# Patient Record
Sex: Female | Born: 1990 | Race: White | Hispanic: Yes | Marital: Married | Smoking: Never smoker
Health system: Southern US, Community
[De-identification: ages and names within clinical notes are randomized; demographics above are authoritative.]

## PROBLEM LIST (undated history)

## (undated) ENCOUNTER — Inpatient Hospital Stay (HOSPITAL_COMMUNITY): Payer: Self-pay

## (undated) DIAGNOSIS — B1910 Unspecified viral hepatitis B without hepatic coma: Secondary | ICD-10-CM

## (undated) DIAGNOSIS — K589 Irritable bowel syndrome without diarrhea: Secondary | ICD-10-CM

## (undated) DIAGNOSIS — N730 Acute parametritis and pelvic cellulitis: Secondary | ICD-10-CM

## (undated) DIAGNOSIS — Z789 Other specified health status: Secondary | ICD-10-CM

## (undated) DIAGNOSIS — R519 Headache, unspecified: Secondary | ICD-10-CM

## (undated) DIAGNOSIS — N9489 Other specified conditions associated with female genital organs and menstrual cycle: Secondary | ICD-10-CM

## (undated) DIAGNOSIS — T7840XA Allergy, unspecified, initial encounter: Secondary | ICD-10-CM

## (undated) DIAGNOSIS — N946 Dysmenorrhea, unspecified: Secondary | ICD-10-CM

## (undated) HISTORY — PX: NO PAST SURGERIES: SHX2092

## (undated) HISTORY — DX: Dysmenorrhea, unspecified: N94.6

## (undated) HISTORY — DX: Allergy, unspecified, initial encounter: T78.40XA

## (undated) HISTORY — DX: Irritable bowel syndrome, unspecified: K58.9

---
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
## 2009-01-07 ENCOUNTER — Encounter: Payer: Self-pay | Admitting: Family Medicine

## 2009-01-09 ENCOUNTER — Ambulatory Visit: Payer: Self-pay | Admitting: Family Medicine

## 2009-02-03 ENCOUNTER — Ambulatory Visit: Payer: Self-pay | Admitting: Family Medicine

## 2009-02-19 ENCOUNTER — Encounter: Payer: Self-pay | Admitting: Family Medicine

## 2009-02-27 ENCOUNTER — Encounter: Payer: Self-pay | Admitting: Family Medicine

## 2009-02-27 ENCOUNTER — Ambulatory Visit: Payer: Self-pay | Admitting: Family Medicine

## 2009-02-27 LAB — CONVERTED CEMR LAB: GC Probe Amp, Genital: NEGATIVE

## 2009-03-02 ENCOUNTER — Inpatient Hospital Stay (HOSPITAL_COMMUNITY): Admission: AD | Admit: 2009-03-02 | Discharge: 2009-03-04 | Payer: Self-pay | Admitting: Obstetrics & Gynecology

## 2009-03-02 ENCOUNTER — Ambulatory Visit: Payer: Self-pay | Admitting: Obstetrics and Gynecology

## 2009-04-01 ENCOUNTER — Encounter: Payer: Self-pay | Admitting: Family Medicine

## 2009-04-08 ENCOUNTER — Telehealth: Payer: Self-pay | Admitting: Family Medicine

## 2009-04-16 ENCOUNTER — Ambulatory Visit: Payer: Self-pay | Admitting: Family Medicine

## 2010-07-13 ENCOUNTER — Encounter: Payer: Self-pay | Admitting: Family Medicine

## 2010-07-22 ENCOUNTER — Encounter: Payer: Self-pay | Admitting: *Deleted

## 2010-09-25 LAB — CBC
HCT: 33.3 % — ABNORMAL LOW (ref 36.0–46.0)
MCHC: 33.7 g/dL (ref 30.0–36.0)
MCV: 88.1 fL (ref 78.0–100.0)
Platelets: 239 10*3/uL (ref 150–400)
RDW: 13.8 % (ref 11.5–15.5)

## 2010-09-30 LAB — URINALYSIS, ROUTINE W REFLEX MICROSCOPIC
Bilirubin Urine: NEGATIVE
Glucose, UA: NEGATIVE mg/dL
Hgb urine dipstick: NEGATIVE
Ketones, ur: NEGATIVE mg/dL
pH: 7 (ref 5.0–8.0)

## 2010-10-01 LAB — GC/CHLAMYDIA PROBE AMP, GENITAL
Chlamydia, DNA Probe: NEGATIVE
GC Probe Amp, Genital: NEGATIVE

## 2010-10-01 LAB — URINALYSIS, ROUTINE W REFLEX MICROSCOPIC
Bilirubin Urine: NEGATIVE
Hgb urine dipstick: NEGATIVE
Nitrite: NEGATIVE
Specific Gravity, Urine: 1.025 (ref 1.005–1.030)
pH: 5.5 (ref 5.0–8.0)

## 2010-10-01 LAB — WET PREP, GENITAL
Clue Cells Wet Prep HPF POC: NONE SEEN
Yeast Wet Prep HPF POC: NONE SEEN

## 2011-06-05 ENCOUNTER — Inpatient Hospital Stay (HOSPITAL_COMMUNITY): Payer: Self-pay

## 2011-06-05 ENCOUNTER — Encounter (HOSPITAL_COMMUNITY): Payer: Self-pay | Admitting: *Deleted

## 2011-06-05 ENCOUNTER — Inpatient Hospital Stay (HOSPITAL_COMMUNITY)
Admission: AD | Admit: 2011-06-05 | Discharge: 2011-06-05 | Disposition: A | Discharge status: Home / Self Care | Payer: Self-pay | Source: Ambulatory Visit | Attending: Obstetrics and Gynecology | Admitting: Obstetrics and Gynecology

## 2011-06-05 DIAGNOSIS — N949 Unspecified condition associated with female genital organs and menstrual cycle: Secondary | ICD-10-CM

## 2011-06-05 DIAGNOSIS — O210 Mild hyperemesis gravidarum: Secondary | ICD-10-CM | POA: Insufficient documentation

## 2011-06-05 DIAGNOSIS — R1032 Left lower quadrant pain: Secondary | ICD-10-CM | POA: Insufficient documentation

## 2011-06-05 DIAGNOSIS — O99891 Other specified diseases and conditions complicating pregnancy: Secondary | ICD-10-CM | POA: Insufficient documentation

## 2011-06-05 HISTORY — DX: Other specified health status: Z78.9

## 2011-06-05 LAB — URINALYSIS, ROUTINE W REFLEX MICROSCOPIC
Glucose, UA: NEGATIVE mg/dL
Leukocytes, UA: NEGATIVE
Nitrite: NEGATIVE
Protein, ur: NEGATIVE mg/dL

## 2011-06-05 LAB — WET PREP, GENITAL: Trich, Wet Prep: NONE SEEN

## 2011-06-05 MED ORDER — PROMETHAZINE HCL 12.5 MG PO TABS: 25.0000 mg | ORAL_TABLET | Freq: Four times a day (QID) | ORAL | Status: AC | PRN

## 2011-06-05 NOTE — Progress Notes (Signed)
Pt reports having sharp abd pain that started on left side and is shooting down to her pelvis. Hurts when she tries to walk or change position. Not started prenatal care yet

## 2011-06-05 NOTE — ED Provider Notes (Signed)
History     Chief Complaint  Patient presents with  . Abdominal Pain   HPI 20 y.o. G2P1001 at approx [redacted] weeks EGA with low abd pain starting last night, started in LLQ, radiating through pelvis, now entire low abd. Worse with position changes and walking. No bleeding or discharge. Patient's last menstrual period was 03/24/2011. Also c/o nausea and vomiting, can keep down food and drink.     Past Medical History  Diagnosis Date  . No pertinent past medical history     Past Surgical History  Procedure Date  . No past surgeries     No family history on file.  History  Substance Use Topics  . Smoking status: Never Smoker   . Smokeless tobacco: Not on file  . Alcohol Use: No    Allergies: No Known Allergies  Prescriptions prior to admission  Medication Sig Dispense Refill  . prenatal vitamin w/FE, FA (PRENATAL 1 + 1) 27-1 MG TABS Take 1 tablet by mouth daily.          Review of Systems  Constitutional: Negative.   Respiratory: Negative.   Cardiovascular: Negative.   Gastrointestinal: Positive for abdominal pain. Negative for nausea, vomiting, diarrhea and constipation.  Genitourinary: Negative for dysuria, urgency, frequency, hematuria and flank pain.       Negative for vaginal bleeding, vaginal discharge, dyspareunia  Musculoskeletal: Negative.   Neurological: Negative.   Psychiatric/Behavioral: Negative.    Physical Exam   Blood pressure 89/58, pulse 82, temperature 97.8 F (36.6 C), temperature source Oral, resp. rate 18, height 5\' 3"  (1.6 m), last menstrual period 03/24/2011.  Physical Exam  Nursing note and vitals reviewed. Constitutional: She is oriented to person, place, and time. She appears well-developed and well-nourished. No distress.  HENT:  Head: Normocephalic and atraumatic.  Cardiovascular: Normal rate.   Respiratory: Effort normal.  GI: Soft. Bowel sounds are normal. She exhibits no mass. There is no tenderness. There is no rebound and no  guarding.  Genitourinary: There is no rash or lesion on the right labia. There is no rash or lesion on the left labia. Uterus is not tender. Enlarged: Size c/w dates. Cervix exhibits no motion tenderness, no discharge and no friability. Right adnexum displays no mass, no tenderness and no fullness. Left adnexum displays no mass, no tenderness and no fullness. No tenderness or bleeding around the vagina. No vaginal discharge found.  Musculoskeletal: Normal range of motion.  Neurological: She is alert and oriented to person, place, and time.  Skin: Skin is warm and dry.  Psychiatric: She has a normal mood and affect.    MAU Course  Procedures  Results for orders placed during the hospital encounter of 06/05/11 (from the past 24 hour(s))  URINALYSIS, ROUTINE W REFLEX MICROSCOPIC     Status: Abnormal   Collection Time   06/05/11  1:00 PM      Component Value Range   Color, Urine YELLOW  YELLOW    APPearance CLEAR  CLEAR    Specific Gravity, Urine >1.030 (*) 1.005 - 1.030    pH 5.5  5.0 - 8.0    Glucose, UA NEGATIVE  NEGATIVE (mg/dL)   Hgb urine dipstick NEGATIVE  NEGATIVE    Bilirubin Urine NEGATIVE  NEGATIVE    Ketones, ur 15 (*) NEGATIVE (mg/dL)   Protein, ur NEGATIVE  NEGATIVE (mg/dL)   Urobilinogen, UA 0.2  0.0 - 1.0 (mg/dL)   Nitrite NEGATIVE  NEGATIVE    Leukocytes, UA NEGATIVE  NEGATIVE  WET PREP, GENITAL     Status: Abnormal   Collection Time   06/05/11  1:43 PM      Component Value Range   Yeast, Wet Prep NONE SEEN  NONE SEEN    Trich, Wet Prep NONE SEEN  NONE SEEN    Clue Cells, Wet Prep NONE SEEN  NONE SEEN    WBC, Wet Prep HPF POC MODERATE (*) NONE SEEN     U/S: 10.3 wk IUP, + FHR, small subchorionic bleed  Assessment and Plan  20 y.o. G3P1001 at 10.[redacted] weeks EGA Round ligament pain - information and comfort measures rev'd N/V - rx phenergan Start prenatal care  Aithana Kushner 06/05/2011, 1:40 PM

## 2011-06-06 NOTE — ED Provider Notes (Signed)
Agree with above note.  Christena Sunderlin 06/06/2011 7:37 AM   

## 2011-06-22 NOTE — L&D Delivery Note (Signed)
Delivery Note At  a viable female was delivered via NSVD (Presentation: LOA ;  ).  APGAR: , ; weight .   Placenta status: spontaneous, intact.  Cord: 3-vessel with the following complications: none.  Cord pH: n/a  Anesthesia:  none Episiotomy: n/a Lacerations: 1st degree Suture Repair: no repair needed as hemostatic and well approximated Est. Blood Loss (mL): 350  Mom to postpartum.  Baby to nursery-stable.  BOOTH, Ayyub Krall 12/16/2011, 1:07 PM

## 2011-06-22 NOTE — L&D Delivery Note (Signed)
I was present during this birth and agree with the above note. Colleen Romack E.

## 2011-08-13 ENCOUNTER — Other Ambulatory Visit: Payer: Self-pay

## 2011-08-13 DIAGNOSIS — Z331 Pregnant state, incidental: Secondary | ICD-10-CM

## 2011-08-13 NOTE — Progress Notes (Signed)
Prenatal labs done today Phylicia Mcgaugh 

## 2011-08-14 LAB — OBSTETRIC PANEL
Antibody Screen: NEGATIVE
Basophils Relative: 0 % (ref 0–1)
Eosinophils Absolute: 0.1 10*3/uL (ref 0.0–0.7)
Eosinophils Relative: 1 % (ref 0–5)
HCT: 35.4 % — ABNORMAL LOW (ref 36.0–46.0)
Hemoglobin: 11.4 g/dL — ABNORMAL LOW (ref 12.0–15.0)
MCH: 28.6 pg (ref 26.0–34.0)
MCHC: 32.2 g/dL (ref 30.0–36.0)
Monocytes Absolute: 0.7 10*3/uL (ref 0.1–1.0)
Monocytes Relative: 7 % (ref 3–12)
Rh Type: POSITIVE

## 2011-08-14 LAB — SICKLE CELL SCREEN: Sickle Cell Screen: NEGATIVE

## 2011-08-15 LAB — CULTURE, OB URINE: Colony Count: 5000

## 2011-08-20 ENCOUNTER — Ambulatory Visit (INDEPENDENT_AMBULATORY_CARE_PROVIDER_SITE_OTHER): Payer: Self-pay | Admitting: Emergency Medicine

## 2011-08-20 ENCOUNTER — Encounter: Payer: Self-pay | Admitting: Emergency Medicine

## 2011-08-20 VITALS — BP 102/60 | Wt 138.8 lb

## 2011-08-20 DIAGNOSIS — N898 Other specified noninflammatory disorders of vagina: Secondary | ICD-10-CM

## 2011-08-20 DIAGNOSIS — Z3480 Encounter for supervision of other normal pregnancy, unspecified trimester: Secondary | ICD-10-CM

## 2011-08-20 LAB — POCT WET PREP (WET MOUNT)

## 2011-08-20 MED ORDER — CLOTRIMAZOLE 1 % VA CREA: 1.0000 | TOPICAL_CREAM | Freq: Every day | VAGINAL | Status: AC

## 2011-08-20 MED ORDER — FERROUS SULFATE 325 (65 FE) MG PO TABS: 325.0000 mg | ORAL_TABLET | Freq: Every day | ORAL | Status: DC

## 2011-08-20 NOTE — Patient Instructions (Addendum)
It looks like everything is going very well!    You do have a yeast infection.  I have given you a prescription for a vaginal cream; please use this once a day for the next 7 days.    Please follow up with me in 4 weeks.  Embarazo - Segundo trimestre (Pregnancy - Second Trimester) El segundo trimestre del Psychiatrist (del 3 al ) es un perodo de evolucin rpida para usted y el beb. Hacia el final del sexto mes, el beb mide aproximadamente 23 cm y pesa 680 g. Comenzar a Pharmacologist del beb [place] 18 y las 20 [address] de [place]. Podr sentir las pataditas ("quickening en ingls"). Hay un rpido [employer]. Puede segregar un lquido claro Charity fundraiser) de las [place]. Quizs sienta pequeas contracciones en el vientre (tero) Esto se conoce como falso trabajo de parto o contracciones de Braxton-Hicks. Es como una prctica del trabajo de parto que se produce cuando el beb est listo para salir. Generalmente los problemas de vmitos matinales ya se han superado hacia el final del Medical sales representative. Algunas mujeres desarrollan pequeas manchas oscuras (que se denominan cloasma, mscara del embarazo) en la cara que normalmente se van luego del nacimiento del beb. La exposicin al sol empeora las manchas. Puede desarrollarse acn en algunas mujeres embarazadas, y puede desaparecer en aquellas que ya tienen acn. EXAMENES PRENATALES  Durante los [employer], deber seguir realizando pruebas de [place], segn avance el [place]. Estas pruebas se realizan para controlar su salud y la del beb. Tambin se realizan anlisis de sangre para [employer] niveles de [place]. La anemia (bajo nivel de hemoglobina) es frecuente durante el embarazo. Para prevenirla, se administran hierro y vitaminas. Tambin se le realizarn exmenes para saber si tiene diabetes entre las 24 y las 28 semanas del [place]. Podrn repetirle algunas de las Hov[employer] Enterprises hicieron previamente.   En cada visita  le medirn el tamao del tero. Esto se realiza para asegurarse de que el beb est creciendo correctamente de acuerdo al estado del [place].   Tambin en cada visita prenatal controlarn su presin arterial. Esto se realiza para asegurarse de que no tenga toxemia.   Se controlar su orina para asegurarse de que no tenga infecciones, diabetes o protena en la orina.   Se controlar su peso regularmente para asegurarse que el aumento ocurre al ritmo indicado. Esto se hace para asegurarse que usted y el beb tienen una evolucin normal.   En algunas ocasiones se realiza una prueba de ultrasonido para confirmar el correcto desarrollo y evolucin del beb. Esta prueba se realiza con ondas sonoras inofensivas para el beb, de modo que el profesional pueda calcular ms precisamente la fecha del [place].  Algunas veces se realizan pruebas especializadas del lquido amnitico que rodea al beb. Esta prueba se denomina amniocentesis. El lquido amnitico se obtiene introduciendo una aguja en el vientre (abdomen). Se realiza para Conservator, museum/gallery en los que existe alguna preocupacin acerca de algn problema gentico que pueda sufrir el beb. En ocasiones se lleva a cabo cerca del final del embarazo, si es necesario inducir al [employer]. En este caso se realiza para asegurarse que los pulmones del beb estn lo suficientemente maduros como para que pueda vivir fuera del tero. CAMBIOS QUE OCURREN EN EL SEGUNDO TRIMESTRE DEL EMBARAZO Su organismo atravesar numerosos cambios durante el [employer]. Estos pueden variar de Neomia Dear persona a otra. [place] con el profesional que la asiste acerca los cambios que  usted note y que la preocupen.  Durante el segundo trimestre probablemente sienta un aumento del apetito. Es normal tener "antojos" de Development worker, community. Esto vara de Neomia Dear persona a otra y de un embarazo a Therapist, art.   El abdomen inferior comenzar a abultarse.   Podr tener la necesidad de Geographical information systems officer  con ms frecuencia debido a que el tero y el beb presionan sobre la vejiga. Tambin es frecuente contraer ms infecciones urinarias durante el embarazo (dolor al ConocoPhillips). Puede evitarlas bebiendo gran cantidad de lquidos y vaciando la vejiga antes y despus de Sales promotion account executive.   Podrn aparecer las primeras estras en las caderas, abdomen y [place]. Estos son cambios normales del cuerpo durante el [place]. No existen medicamentos ni ejercicios que puedan prevenir [employer]Max.   Es posible que comience a desarrollar venas inflamadas y abultadas (varices) en las piernas. El uso de medias de descanso, Optometrist sus pies durante 15 minutos, 3 a 4 veces al da y Film/video editor la sal en su dieta ayuda a Journalist, newspaper.   Podr sentir Engineering geologist gstrica a medida que el tero crece y Doctor, general practice. Puede tomar anticidos, con la autorizacin de su mdico, para Financial planner. Tambin es til ingerir pequeas comidas 4 a 5 veces al Futures trader.   La constipacin puede tratarse con un laxante o agregando fibra a su dieta. Beber grandes cantidades de lquidos, comer vegetales, frutas y granos integrales es de [place].   Tambin es beneficioso practicar actividad fsica. Si ha sido una persona Engineer, mining, podr continuar con la Harley[employer]son de las actividades durante el mismo. Si ha sido [employer], puede ser beneficioso que comience con un programa de ejercicios, Museum/gallery exhibitions officer.   Puede desarrollar hemorroides (vrices en el recto) hacia el final del segundo trimestre. Tomar baos de asiento tibios y Chemical engineer cremas recomendadas por el profesional que lo asiste sern de ayuda para los problemas de hemorroides.   Tambin podr Financial risk analyst de espalda durante este momento de su embarazo. Evite levantar objetos pesados, utilice zapatos de taco bajo y [place] buena postura para ayudar a reducir los problemas de [place].   Algunas mujeres embarazadas desarrollan  hormigueo y adormecimiento de la mano y los dedos debido a la hinchazn y compresin de los ligamentos de la mueca (sndrome del tnel carpiano). Esto desaparece una vez que el beb nace.   Como sus pechos se agrandan, Pension scheme manager un sujetador ms grande. Use un sostn de soporte, cmodo y de algodn. No utilice un sostn para amamantar hasta el ltimo mes de embarazo si va a amamantar al beb.   Podr observar una lnea oscura desde el ombligo hacia la zona pbica denominada linea nigra.   Podr observar que sus mejillas se ponen coloradas debido al aumento de flujo sanguneo en la cara.   Podr desarrollar "araitas" en la cara, cuello y pecho. Esto desaparece una vez que el beb nace.  INSTRUCCIONES PARA EL CUIDADO DOMICILIARIO  Es extremadamente importante que evite el cigarrillo, hierbas medicinales, alcohol y las drogas no prescriptas durante el Psychiatrist. Estas sustancias qumicas afectan la formacin y el desarrollo del beb. Evite estas sustancias durante todo el embarazo para asegurar el nacimiento de un beb sano.   La mayor parte de los cuidados que se aconsejan son los mismos que los indicados para Financial risk analyst trimestre del Psychiatrist. Cumpla con las citas tal como se le indic. Siga las instrucciones del profesional que lo asiste con respecto al [place] de  los medicamentos, el ejercicio y Psychologist, forensic.   Durante el embarazo debe obtener nutrientes para usted y para su beb. Consuma alimentos balanceados a intervalos regulares. Elija alimentos como carne, pescado, [place] y otros productos lcteos descremados, vegetales, frutas, panes integrales y cereales. El Equities trader cul es el aumento de peso ideal.   Las relaciones sexuales fsicas pueden continuarse hasta cerca del fin del embarazo si no existen otros problemas. Estos problemas pueden ser la prdida temprana (prematura) de lquido amnitico de las [place], sangrado vaginal, dolor abdominal u otros problemas mdicos o del  Psychiatrist.   Realice Tesoro Corporation, si no tiene restricciones. Consulte con el profesional que la asiste si no sabe con certeza si determinados ejercicios son seguros. El mayor aumento de peso tiene Environmental consultant durante los ltimos 2 trimestres del Psychiatrist. El ejercicio la ayudar a:   Engineering geologist.   Ponerla en forma para el parto.   Ayudarla a perder peso luego de haber dado a luz.   Use un buen sostn o como los que se usan para hacer deportes para Paramedic la sensibilidad de las [place]. Tambin puede serle til si lo [place] mientras duerme. Si pierde Product manager, podr Parker Hann[employer]in.   No utilice la baera con agua caliente, baos turcos y saunas durante el [address]   Utilice el cinturn de seguridad sin excepcin cuando conduzca. Este la proteger a usted y al beb en caso de accidente.   Evite comer carne cruda, queso crudo, y el contacto con los utensilios y desperdicios de los gatos. Estos elementos contienen grmenes que pueden causar defectos de nacimiento en el beb.   El segundo trimestre es un buen momento para visitar a su dentista y Software engineer si an no lo ha hecho. Es Primary school teacher los dientes limpios. Utilice un cepillo de dientes blando. Cepllese ms suavemente durante el embarazo.   Es ms fcil perder algo de orina durante el [place]. Apretar y Chief Operating Officer los msculos de la pelvis la ayudar con este problema. Practique detener la miccin cuando est en el bao. Estos son los mismos msculos que Development worker, international aid. Son [employer] mismos msculos que utiliza cuando trata de [employer]der System gases. Puede practicar apretando estos msculos 10 veces, y repetir esto 3 veces por da aproximadamente. Una vez que conozca qu msculos debe apretar, no realice estos ejercicios durante la miccin. Puede favorecerle una infeccin si la orina vuelve hacia atrs.   Pida ayuda si tiene necesidades econmicas, de asesoramiento o nutricionales  durante el [place]. El profesional podr ayudarla con respecto a estas necesidades, o derivarla a otros especialistas.   La piel puede ponerse grasa. Si esto sucede, lvese la cara con un jabn [place], utilice un humectante no graso y [place] con base de aceite o crema.  CONSUMO DE MEDICAMENTOS Y DROGAS DURANTE EL EMBARAZO  Contine tomando las vitaminas apropiadas para esta etapa tal como se le indic. Las vitaminas deben contener un miligramo de cido flico y deben suplementarse con hierro. Guarde todas las vitaminas fuera del alcance de los nios. La ingestin de slo un par de vitaminas o tabletas que contengan hierro puede ocasionar la [employer] Mining en un beb o en un nio pequeo.   Evite el uso de [place], inclusive los de venta libre y hierbas que no hayan sido prescriptos o indicados por el profesional que la asiste. Algunos medicamentos pueden causar problemas fsicos al beb. Utilice los medicamentos de venta libre o de prescripcin para el  dolor, el malestar o la fiebre, segn se lo indique el profesional que lo asiste. No utilice aspirina.   El consumo de alcohol est relacionado con ciertos defectos de nacimiento. Esto incluye el sndrome de alcoholismo fetal. Debe evitar el consumo de alcohol en cualquiera de sus formas. El cigarrillo causa nacimientos prematuros y bebs de [place]. El uso de drogas recreativas est absolutamente prohibido. Son muy nocivas para el beb. Un beb que nace de [employer] Express, ser adicto al nacer. Ese beb tendr los mismos sntomas de abstinencia que un adulto.   Infrmele al profesional si consume alguna droga.   No consuma drogas ilegales. Pueden causarle mucho dao al beb.  SOLICITE ATENCIN MDICA SI: Tiene preguntas o preocupaciones durante su embarazo. Es mejor que llame para Science writer las dudas que esperar hasta su prxima visita prenatal. Thressa Sheller forma se sentir ms tranquila.  SOLICITE ATENCIN MDICA DE INMEDIATO SI:  La temperatura  oral se eleva sin motivo por encima de 102 F (38.9 C) o segn le indique el profesional que lo asiste.   Tiene una prdida de lquido por la vagina (canal de parto). Si sospecha una ruptura de las [place], tmese la temperatura y llame al profesional para informarlo sobre esto.   Observa unas pequeas manchas, una hemorragia vaginal o elimina cogulos. Notifique al profesional acerca de la cantidad y de cuntos apsitos est utilizando. Unas pequeas manchas de sangre son algo comn durante el Psychiatrist, especialmente despus de Sales promotion account executive.   Presenta un olor desagradable en la secrecin vaginal y observa un cambio en el color, de transparente a blanco.   Contina con las nuseas y no obtiene alivio de los remedios indicados. Vomita sangre o algo similar a la borra del caf.   [place] o sube ms de 900 g. en una semana, o segn lo indicado por el profesional que la asiste.   Observa que se le Southwest Airlines, las manos, los pies o las piernas.   Ha estado expuesta a la rubola y no ha sufrido la enfermedad.   Ha estado expuesta a la quinta enfermedad o a la varicela.   Presenta dolor abdominal. Las molestias en el ligamento redondo son Neomia Dear causa no cancerosa (benigna) frecuente de dolor abdominal durante el embarazo. El profesional que la asiste deber evaluarla.   Presenta dolor de cabeza intenso que no se [place].   Presenta fiebre, diarrea, dolor al orinar o le falta la respiracin.   Presenta dificultad para ver, visin borrosa, o visin doble.   Sufre una cada, un accidente de trnsito o cualquier tipo de trauma.   Vive en un hogar en el que existe violencia fsica o mental.  Document Released: 03/17/2005 Document Revised: 02/17/2011 [facility] Patient Information 2012 [place], [place].

## 2011-08-20 NOTE — Progress Notes (Signed)
S: Pt presents today for initial OB visit.  History reviewed and not concerning.  Prenatal labs show some mild anemia, but otherwise are within normal limits. Was seen at [facility] for lower abdominal pain and diagnosed with round ligament pain at 10 weeks.  Had an ultrasound at that visit that was consistent with dates.  Also had GC/Chlamydia done and these were negative.  Feeling the baby kick.  No vaginal bleeding or cramps.  Does have some yellowish vaginal discharge associated with itching and burning.  Has used some OTC creams that help for awhile.  Feels safe at home.  Has crib and car seat from 1st pregnancy.  Plans on breastfeeding.  Would like to try Nexplanon for [facility] after delivery.   O: vitals reviewed CV: RRR, no murmurs Pulm: CTAB, no wheezes or rales Fundal height: 21cm FHT: 138 Pelvic: normal external genitalia; thick white discharge present in vagina; cervix normal; cervix long and closed on bimanual exam, no adnexal masses or tenderness  A/P: 21 yo G2P1001 at [redacted]w[redacted]d -normal pregnancy -prenatal labs unremarkable -never smoker -prenatal vitamin -will give iron supplement -pt deferred anatomy scan due to cost - has appt with Jaynee Eagles for orange card, she will let us know if she qualifies so we can order the scan. -too late for genetic screening -yeast infection - will treat with clotrimazole cream 1% for 7 days -depression screen negative -reports feeling safe at home -will obtain pap results from 11/12 -glucola screen at next appointment -follow up in 4 weeks

## 2011-08-24 ENCOUNTER — Encounter: Payer: Self-pay | Admitting: Family Medicine

## 2011-08-25 ENCOUNTER — Telehealth: Payer: Self-pay | Admitting: Family Medicine

## 2011-08-25 NOTE — Telephone Encounter (Signed)
Pt now has orange card and needs appt for OB US

## 2011-08-25 NOTE — Telephone Encounter (Signed)
Forward to PCP for order.Colleen Daniels  

## 2011-08-26 ENCOUNTER — Telehealth: Payer: Self-pay | Admitting: Family Medicine

## 2011-08-26 NOTE — Telephone Encounter (Signed)
Called pt. Told the pt, that I will send a request to Dr.Newton to order the [place] and we will call her back with the appt. Pt has orange card and likes to have appt in the evening. Fwd. To Dr.Newton .Arlyss Repress

## 2011-08-26 NOTE — Telephone Encounter (Signed)
Please call patient with an appt for her ultrasound.

## 2011-08-27 ENCOUNTER — Other Ambulatory Visit: Payer: Self-pay | Admitting: Family Medicine

## 2011-08-27 DIAGNOSIS — Z331 Pregnant state, incidental: Secondary | ICD-10-CM

## 2011-08-27 NOTE — Telephone Encounter (Signed)
Order completed. Please set up and contact pt.  Thank you

## 2011-08-27 NOTE — Telephone Encounter (Signed)
Called pt. Informed of [place] appt. Lorenda Hatchet, [name]

## 2011-08-27 NOTE — Telephone Encounter (Signed)
Called pt and left message to call back. Please tell pt: OB US scheduled at [facility] Wednesday, 09-01-11 at 2:30 pm.  .Colleen Daniels

## 2011-09-01 ENCOUNTER — Ambulatory Visit (HOSPITAL_COMMUNITY)
Admission: RE | Admit: 2011-09-01 | Discharge: 2011-09-01 | Disposition: A | Discharge status: Home / Self Care | Payer: Self-pay | Source: Ambulatory Visit | Attending: Family Medicine | Admitting: Family Medicine

## 2011-09-01 DIAGNOSIS — Z363 Encounter for antenatal screening for malformations: Secondary | ICD-10-CM | POA: Insufficient documentation

## 2011-09-01 DIAGNOSIS — Z331 Pregnant state, incidental: Secondary | ICD-10-CM

## 2011-09-01 DIAGNOSIS — O358XX0 Maternal care for other (suspected) fetal abnormality and damage, not applicable or unspecified: Secondary | ICD-10-CM | POA: Insufficient documentation

## 2011-09-01 DIAGNOSIS — Z1389 Encounter for screening for other disorder: Secondary | ICD-10-CM | POA: Insufficient documentation

## 2011-09-13 ENCOUNTER — Telehealth: Payer: Self-pay | Admitting: Family Medicine

## 2011-09-13 ENCOUNTER — Ambulatory Visit (INDEPENDENT_AMBULATORY_CARE_PROVIDER_SITE_OTHER): Payer: Self-pay | Admitting: Family Medicine

## 2011-09-13 VITALS — BP 103/65 | Wt 144.0 lb

## 2011-09-13 DIAGNOSIS — N898 Other specified noninflammatory disorders of vagina: Secondary | ICD-10-CM

## 2011-09-13 DIAGNOSIS — Z3480 Encounter for supervision of other normal pregnancy, unspecified trimester: Secondary | ICD-10-CM

## 2011-09-13 DIAGNOSIS — B49 Unspecified mycosis: Secondary | ICD-10-CM

## 2011-09-13 DIAGNOSIS — B379 Candidiasis, unspecified: Secondary | ICD-10-CM

## 2011-09-13 LAB — POCT WET PREP (WET MOUNT)

## 2011-09-13 MED ORDER — FLUCONAZOLE 150 MG PO TABS: 150.0000 mg | ORAL_TABLET | Freq: Once | ORAL | Status: AC

## 2011-09-13 MED ORDER — DOCUSATE SODIUM 100 MG PO CAPS: 100.0000 mg | ORAL_CAPSULE | Freq: Two times a day (BID) | ORAL | Status: DC

## 2011-09-13 NOTE — Assessment & Plan Note (Signed)
Doing well.  Colace and iron.  Continue PNV. Will come to [facility] next week.

## 2011-09-13 NOTE — Assessment & Plan Note (Signed)
Treat with diflucan po x1, repeat in 72 hours if needed.

## 2011-09-13 NOTE — Progress Notes (Signed)
20yo G2P1001 at [redacted]w[redacted]d by LMP and 10wk U/S.  Patient p/w complaint of vaginal discharge.  Was Rx'ed something earlier this month but was unable to pick it up.  Has not taken anything for this.  Discharge started 2 weeks ago; was thick and clumping.  +vaginal itching and irritation.    PE: vitals as above Pelvic: copious, white clumps c/w yeast infection, cervix normal, closed, nonfriable, no external lesions  A/P: -diflucan for yeast infection -iron for anemia -colace for general constipation of pregnancy and ppx for iron -f/u next week at [facility] group

## 2011-09-13 NOTE — Telephone Encounter (Signed)
Spoke with patient on phone today to remind her of her April 2nd group prenatal care appointment.  She states that she missed the first group 2/2 transportation issues.  She also expresses concern about not being able to find childcare to come to April 2nd appointment.  Told her about group rules that were established at last visit- if unable to find childcare can bring child in case of emergency.  Pt states understanding.  Coming in for work in appointment today for vaginal discharge.  Will plan to come to April 2nd group appointment.

## 2011-09-13 NOTE — Patient Instructions (Addendum)
It was nice to meet you today! Take diflucan for your yeast infection (1 pill today, and then if you're still having symptoms, take the 2nd pill in 3 days). For your low blood count, take the iron pill every day. To help with constipation, I am sending in a medicine called colace (it's stool softener). Drink lots of water. Keep taking your prenatal vitamin! We will see you on Tuesday a the [facility]!   Embarazo - Segundo trimestre (Pregnancy - Second Trimester) El segundo trimestre del Psychiatrist (del 3 al ) es un perodo de evolucin rpida para usted y el beb. Hacia el final del sexto mes, el beb mide aproximadamente 23 cm y pesa 680 g. Comenzar a Pharmacologist del beb [place] 18 y las 20 [address] de [place]. Podr sentir las pataditas ("quickening en ingls"). Hay un rpido [employer]. Puede segregar un lquido claro Charity fundraiser) de las [place]. Quizs sienta pequeas contracciones en el vientre (tero) Esto se conoce como falso trabajo de parto o contracciones de Braxton-Hicks. Es como una prctica del trabajo de parto que se produce cuando el beb est listo para salir. Generalmente los problemas de vmitos matinales ya se han superado hacia el final del Medical sales representative. Algunas mujeres desarrollan pequeas manchas oscuras (que se denominan cloasma, mscara del embarazo) en la cara que normalmente se van luego del nacimiento del beb. La exposicin al sol empeora las manchas. Puede desarrollarse acn en algunas mujeres embarazadas, y puede desaparecer en aquellas que ya tienen acn. EXAMENES PRENATALES  Durante los [employer], deber seguir realizando pruebas de [place], segn avance el [place]. Estas pruebas se realizan para controlar su salud y la del beb. Tambin se realizan anlisis de sangre para [employer] niveles de [place]. La anemia (bajo nivel de hemoglobina) es frecuente durante el embarazo. Para prevenirla, se administran hierro y vitaminas. Tambin  se le realizarn exmenes para saber si tiene diabetes entre las 24 y las 28 semanas del [place]. Podrn repetirle algunas de las [employer] Enterprises hicieron previamente.   En cada visita le medirn el tamao del tero. Esto se realiza para asegurarse de que el beb est creciendo correctamente de acuerdo al estado del [place].   Tambin en cada visita prenatal controlarn su presin arterial. Esto se realiza para asegurarse de que no tenga toxemia.   Se controlar su orina para asegurarse de que no tenga infecciones, diabetes o protena en la orina.   Se controlar su peso regularmente para asegurarse que el aumento ocurre al ritmo indicado. Esto se hace para asegurarse que usted y el beb tienen una evolucin normal.   En algunas ocasiones se realiza una prueba de ultrasonido para confirmar el correcto desarrollo y evolucin del beb. Esta prueba se realiza con ondas sonoras inofensivas para el beb, de modo que el profesional pueda calcular ms precisamente la fecha del [place].  Algunas veces se realizan pruebas especializadas del lquido amnitico que rodea al beb. Esta prueba se denomina amniocentesis. El lquido amnitico se obtiene introduciendo una aguja en el vientre (abdomen). Se realiza para Conservator, museum/gallery en los que existe alguna preocupacin acerca de algn problema gentico que pueda sufrir el beb. En ocasiones se lleva a cabo cerca del final del embarazo, si es necesario inducir al [employer]. En este caso se realiza para asegurarse que los pulmones del beb estn lo suficientemente maduros como para que pueda vivir fuera del tero. CAMBIOS QUE OCURREN EN EL SEGUNDO TRIMESTRE DEL [facility] Su  organismo atravesar numerosos cambios Academic librarian. Estos pueden variar de Neomia Dear persona a otra. [place] con el profesional que la asiste acerca los cambios que usted note y que la preocupen.  Durante el segundo trimestre probablemente sienta un aumento del apetito. Es  normal tener "antojos" de Development worker, community. Esto vara de Neomia Dear persona a otra y de un embarazo a Therapist, art.   El abdomen inferior comenzar a abultarse.   Podr tener la necesidad de Geographical information systems officer con ms frecuencia debido a que el tero y el beb presionan sobre la vejiga. Tambin es frecuente contraer ms infecciones urinarias durante el embarazo (dolor al Conoco[employer]illips). Puede evitarlas bebiendo gran cantidad de lquidos y vaciando la vejiga antes y despus de Sales promotion account executive.   Podrn aparecer las primeras estras en las caderas, abdomen y [place]. Estos son cambios normales del cuerpo durante el [place]. No existen medicamentos ni ejercicios que puedan prevenir [employer].   Es posible que comience a desarrollar venas inflamadas y abultadas (varices) en las piernas. El uso de medias de descanso, Optometrist sus pies durante 15 minutos, 3 a 4 veces al da y Film/video editor la sal en su dieta ayuda a Journalist, newspaper.   Podr sentir Engineering geologist gstrica a medida que el tero crece y Doctor, general practice. Puede tomar anticidos, con la autorizacin de su mdico, para Financial planner. Tambin es til ingerir pequeas comidas 4 a 5 veces al Futures trader.   La constipacin puede tratarse con un laxante o agregando fibra a su dieta. Beber grandes cantidades de lquidos, comer vegetales, frutas y granos integrales es de [place].   Tambin es beneficioso practicar actividad fsica. Si ha sido una persona Engineer, mining, podr continuar con la [employer]son de las actividades durante el mismo. Si ha sido [employer], puede ser beneficioso que comience con un programa de ejercicios, Museum/gallery exhibitions officer.   Puede desarrollar hemorroides (vrices en el recto) hacia el final del segundo trimestre. Tomar baos de asiento tibios y Chemical engineer cremas recomendadas por el profesional que lo asiste sern de ayuda para los problemas de hemorroides.   Tambin podr Financial risk analyst de espalda durante este momento de su  embarazo. Evite levantar objetos pesados, utilice zapatos de taco bajo y [place] buena postura para ayudar a reducir los problemas de [place].   Algunas mujeres embarazadas desarrollan hormigueo y adormecimiento de la mano y los dedos debido a la hinchazn y compresin de los ligamentos de la mueca (sndrome del tnel carpiano). Esto desaparece una vez que el beb nace.   Como sus pechos se agrandan, Pension scheme manager un sujetador ms grande. Use un sostn de soporte, cmodo y de algodn. No utilice un sostn para amamantar hasta el ltimo mes de embarazo si va a amamantar al beb.   Podr observar una lnea oscura desde el ombligo hacia la zona pbica denominada linea nigra.   Podr observar que sus mejillas se ponen coloradas debido al aumento de flujo sanguneo en la cara.   Podr desarrollar "araitas" en la cara, cuello y pecho. Esto desaparece una vez que el beb nace.  INSTRUCCIONES PARA EL CUIDADO DOMICILIARIO  Es extremadamente importante que evite el cigarrillo, hierbas medicinales, alcohol y las drogas no prescriptas durante el Psychiatrist. Estas sustancias qumicas afectan la formacin y el desarrollo del beb. Evite estas sustancias durante todo el embarazo para asegurar el nacimiento de un beb sano.   La mayor parte de los cuidados que se aconsejan son los [employer] indicados para el  primer trimestre del embarazo. Cumpla con las citas tal como se le indic. Siga las instrucciones del profesional que lo asiste con respecto al uso de los medicamentos, el ejercicio y Psychologist, forensic.   Durante el embarazo debe obtener nutrientes para usted y para su beb. Consuma alimentos balanceados a intervalos regulares. Elija alimentos como carne, pescado, [place] y otros productos lcteos descremados, vegetales, frutas, panes integrales y cereales. El Equities trader cul es el aumento de peso ideal.   Las relaciones sexuales fsicas pueden continuarse hasta cerca del fin del embarazo si no  existen otros problemas. Estos problemas pueden ser la prdida temprana (prematura) de lquido amnitico de las [place], sangrado vaginal, dolor abdominal u otros problemas mdicos o del Psychiatrist.   Realice Tesoro Corporation, si no tiene restricciones. Consulte con el profesional que la asiste si no sabe con certeza si determinados ejercicios son seguros. El mayor aumento de peso tiene Environmental consultant durante los ltimos 2 trimestres del Psychiatrist. El ejercicio la ayudar a:   Engineering geologist.   Ponerla en forma para el parto.   Ayudarla a perder peso luego de haber dado a luz.   Use un buen sostn o como los que se usan para hacer deportes para Paramedic la sensibilidad de las [place]. Tambin puede serle til si lo [place] mientras duerme. Si pierde Product manager, podr [employer]in.   No utilice la baera con agua caliente, baos turcos y saunas durante el [address]   Utilice el cinturn de seguridad sin excepcin cuando conduzca. Este la proteger a usted y al beb en caso de accidente.   Evite comer carne cruda, queso crudo, y el contacto con los utensilios y desperdicios de los gatos. Estos elementos contienen grmenes que pueden causar defectos de nacimiento en el beb.   El segundo trimestre es un buen momento para visitar a su dentista y Software engineer si an no lo ha hecho. Es Primary school teacher los dientes limpios. Utilice un cepillo de dientes blando. Cepllese ms suavemente durante el embarazo.   Es ms fcil perder algo de orina durante el [place]. Apretar y Chief Operating Officer los msculos de la pelvis la ayudar con este problema. Practique detener la miccin cuando est en el bao. Estos son los mismos msculos que Development worker, international aid. Son [employer] mismos msculos que utiliza cuando trata de [employer] gases. Puede practicar apretando estos msculos 10 veces, y repetir esto 3 veces por da aproximadamente. Una vez que conozca qu msculos debe apretar, no  realice estos ejercicios durante la miccin. Puede favorecerle una infeccin si la orina vuelve hacia atrs.   Pida ayuda si tiene necesidades econmicas, de asesoramiento o nutricionales durante el [place]. El profesional podr ayudarla con respecto a estas necesidades, o derivarla a otros especialistas.   La piel puede ponerse grasa. Si esto sucede, lvese la cara con un jabn [place], utilice un humectante no graso y [place] con base de aceite o crema.  CONSUMO DE MEDICAMENTOS Y DROGAS DURANTE EL EMBARAZO  Contine tomando las vitaminas apropiadas para esta etapa tal como se le indic. Las vitaminas deben contener un miligramo de cido flico y deben suplementarse con hierro. Guarde todas las vitaminas fuera del alcance de los nios. La ingestin de slo un par de vitaminas o tabletas que contengan hierro puede ocasionar la [employer] en un beb o en un nio pequeo.   Evite el uso de [place], inclusive los de venta libre y hierbas que no hayan sido prescriptos o  indicados por el profesional que la asiste. Algunos medicamentos pueden causar problemas fsicos al beb. Utilice los medicamentos de venta libre o de prescripcin para Chief Technology Officer, Environmental health practitioner o la [place], segn se lo indique el profesional que lo asiste. No utilice aspirina.   El consumo de alcohol est relacionado con ciertos defectos de nacimiento. Esto incluye el sndrome de alcoholismo fetal. Debe evitar el consumo de alcohol en cualquiera de sus formas. El cigarrillo causa nacimientos prematuros y bebs de [place]. El uso de drogas recreativas est absolutamente prohibido. Son muy nocivas para el beb. Un beb que nace de [employer], ser adicto al nacer. Ese beb tendr los mismos sntomas de abstinencia que un adulto.   Infrmele al profesional si consume alguna droga.   No consuma drogas ilegales. Pueden causarle mucho dao al beb.  SOLICITE ATENCIN MDICA SI: Tiene preguntas o preocupaciones durante su embarazo. Es  mejor que llame para Science writer las dudas que esperar hasta su prxima visita prenatal. Thressa Sheller forma se sentir ms tranquila.  SOLICITE ATENCIN MDICA DE INMEDIATO SI:  La temperatura oral se eleva sin motivo por encima de 102 F (38.9 C) o segn le indique el profesional que lo asiste.   Tiene una prdida de lquido por la vagina (canal de parto). Si sospecha una ruptura de las [place], tmese la temperatura y llame al profesional para informarlo sobre esto.   Observa unas pequeas manchas, una hemorragia vaginal o elimina cogulos. Notifique al profesional acerca de la cantidad y de cuntos apsitos est utilizando. Unas pequeas manchas de sangre son algo comn durante el Psychiatrist, especialmente despus de Sales promotion account executive.   Presenta un olor desagradable en la secrecin vaginal y observa un cambio en el color, de transparente a blanco.   Contina con las nuseas y no obtiene alivio de los remedios indicados. Vomita sangre o algo similar a la borra del caf.   [place] o sube ms de 900 g. en una semana, o segn lo indicado por el profesional que la asiste.   Observa que se le Southwest [employer], las manos, los pies o las piernas.   Ha estado expuesta a la rubola y no ha sufrido la enfermedad.   Ha estado expuesta a la quinta enfermedad o a la varicela.   Presenta dolor abdominal. Las molestias en el ligamento redondo son Neomia Dear causa no cancerosa (benigna) frecuente de dolor abdominal durante el embarazo. El profesional que la asiste deber evaluarla.   Presenta dolor de cabeza intenso que no se [place].   Presenta fiebre, diarrea, dolor al orinar o le falta la respiracin.   Presenta dificultad para ver, visin borrosa, o visin doble.   Sufre una cada, un accidente de trnsito o cualquier tipo de trauma.   Vive en un hogar en el que existe violencia fsica o mental.  Document Released: 03/17/2005 Document Revised: 05/27/2011 [facility] Patient Information 2012  [place], [place].

## 2011-09-21 ENCOUNTER — Ambulatory Visit: Payer: Self-pay | Admitting: Family Medicine

## 2011-09-21 ENCOUNTER — Other Ambulatory Visit: Payer: Self-pay | Admitting: Family Medicine

## 2011-09-21 ENCOUNTER — Encounter: Payer: Self-pay | Admitting: Emergency Medicine

## 2011-09-21 ENCOUNTER — Ambulatory Visit: Payer: Self-pay | Admitting: Emergency Medicine

## 2011-09-21 VITALS — BP 100/60 | Wt 142.5 lb

## 2011-09-21 DIAGNOSIS — Z3490 Encounter for supervision of normal pregnancy, unspecified, unspecified trimester: Secondary | ICD-10-CM

## 2011-09-21 NOTE — Progress Notes (Signed)
21 yo G2P1001 at [redacted]w[redacted]d by LMP and 10 wk [place].  Doing well except for some low back pain.  Present every day, but comes and goes.  Mostly achy, but will get sharp sometimes.  Occasionally with radiate around the belly.  No triggers for pain.  Has tried tylenol, heat, gentle stretching with no relief.  Vaginal discharge resolved.  No vaginal bleeding or LOF.  Good fetal movement.  O: see flowsheet Back: no erythema or edema, no verterbral step-offs, mild point tenderness at sacrum  A/P -symptomatic care for back pain as in AVS -will return for lab appointment for second trimester labs, glucola and varicella titers -follow up in 4 weeks in OB clinic -preterm labor and kick counts reviewed

## 2011-09-21 NOTE — Patient Instructions (Signed)
It was nice to see you again.  You and the baby are doing very well.    I'm sorry your back is hurting...there are a couple of things you can try -have your husband massage your lower back with an ice cube -use a muscle rub like BenGay on your lower back -wear a support belt (you can find these at [facility] or the pharmacy usually) -take tylenol as needed for pain -you can take benadryl at night to help you sleep if needed  Please make a lab appointment for sometime in the next 1-2 weeks for your glucola test and blood work (this will take about 1 hour).  Follow up in OB Clinic in 4 weeks.

## 2011-09-21 NOTE — Progress Notes (Signed)
Note reviewed.  Agree with Dr. Jonah Blue plan and note.  Next visit, will need to enter information about previous OB hx.   Pt to follow up with [facility] care.

## 2011-09-29 ENCOUNTER — Other Ambulatory Visit: Payer: Self-pay

## 2011-09-29 DIAGNOSIS — Z3480 Encounter for supervision of other normal pregnancy, unspecified trimester: Secondary | ICD-10-CM

## 2011-09-29 DIAGNOSIS — Z3490 Encounter for supervision of normal pregnancy, unspecified, unspecified trimester: Secondary | ICD-10-CM

## 2011-09-29 LAB — GLUCOSE, CAPILLARY: Comment 1: 1

## 2011-09-29 NOTE — Progress Notes (Signed)
1 hr OB glucose = 106 mg/dL BAJORDAN, MLS

## 2011-10-21 ENCOUNTER — Ambulatory Visit: Payer: Self-pay | Admitting: Family Medicine

## 2011-10-21 VITALS — BP 107/63 | Temp 97.1°F | Wt 149.0 lb

## 2011-10-21 DIAGNOSIS — Z3490 Encounter for supervision of normal pregnancy, unspecified, unspecified trimester: Secondary | ICD-10-CM

## 2011-10-21 DIAGNOSIS — Z3480 Encounter for supervision of other normal pregnancy, unspecified trimester: Secondary | ICD-10-CM

## 2011-10-21 DIAGNOSIS — B379 Candidiasis, unspecified: Secondary | ICD-10-CM

## 2011-10-21 MED ORDER — FLUCONAZOLE 150 MG PO TABS: 150.0000 mg | ORAL_TABLET | Freq: Once | ORAL | Status: AC

## 2011-10-21 NOTE — Patient Instructions (Signed)
It was wonderful to see you again!  Everything is going very well with your pregnancy.  Your glucola test was normal.  We are getting some labs today, we will go over the results at your next appointment.  For the vaginal discharge - this is likely another yeast infection.  I will send a prescription to your pharmacy.  If the discharge does not improve, please return to clinic so we can collect some cultures. Our social worker, Colleen Daniels, will be calling you in the next week to talk about options for assistance with birth control.  If you are experiencing any regular contractions, think your water broke, have any vaginal bleeding, or stop feeling the baby move, please go to [facility] to be evaluated.  Follow up in 2 weeks.  Fetal Movement Counts Patient Name: __________________________________________________ Patient Due Date: ____________________ Colleen Daniels counts is highly recommended in high risk pregnancies, but it is a good idea for every pregnant woman to do. Start counting fetal movements at 28 weeks of the pregnancy. Fetal movements increase after eating a full meal or eating or drinking something sweet (the blood sugar is higher). It is also important to drink plenty of fluids (well hydrated) before doing the count. Lie on your left side because it helps with the circulation or you can sit in a comfortable chair with your arms over your belly (abdomen) with no distractions around you. DOING THE COUNT  Try to do the count the same time of day each time you do it.   Mark the day and time, then see how long it takes for you to feel 10 movements (kicks, flutters, swishes, rolls). You should have at least 10 movements within 2 hours. You will most likely feel 10 movements in much less than 2 hours. If you do not, wait an hour and count again. After a couple of days you will see a pattern.   What you are looking for is a change in the pattern or not enough counts in 2 hours. Is it taking longer in  time to reach 10 movements?  SEEK MEDICAL CARE IF:  You feel less than 10 counts in 2 hours. Tried twice.   No movement in one hour.   The pattern is changing or taking longer each day to reach 10 counts in 2 hours.   You feel the baby is not moving as it usually does.  Date: ____________ Movements: ____________ Start time: ____________ Doreatha Martin time: ____________  Date: ____________ Movements: ____________ Start time: ____________ Doreatha Martin time: ____________ Date: ____________ Movements: ____________ Start time: ____________ Doreatha Martin time: ____________ Date: ____________ Movements: ____________ Start time: ____________ Doreatha Martin time: ____________ Date: ____________ Movements: ____________ Start time: ____________ Doreatha Martin time: ____________ Date: ____________ Movements: ____________ Start time: ____________ Doreatha Martin time: ____________ Date: ____________ Movements: ____________ Start time: ____________ Doreatha Martin time: ____________ Date: ____________ Movements: ____________ Start time: ____________ Doreatha Martin time: ____________  Date: ____________ Movements: ____________ Start time: ____________ Doreatha Martin time: ____________ Date: ____________ Movements: ____________ Start time: ____________ Doreatha Martin time: ____________

## 2011-10-21 NOTE — Progress Notes (Signed)
21 yo G2P1001 at [redacted]w[redacted]d by LMP and 10 wk [place].  Doing well.  Has started having some vaginal itching and yellow discharge.  States this is how her previous yeast infection started.  No new sexual partners, no history of STDs.  Does have some fatigue and weakness.  Baby is very active, particularly at night.  No vaginal bleeding or loss of fluid.  Does have occasional Braxton-Hicks contractions if more active during the day. Plans to breastfeed Ped: [facility] if taking Medicaid BC: undecided, does NOT want Mirena again; no insurance so cost is an issue  O: see flowsheet  A/P - diflucan 150mg  PO x1 for presumed yeast infection - if no improvement will culture/wet prep - 28 week labs and varicella titer today - preterm labor and kick counts reviewed - follow up in 2 weeks - will need Tdap at that time

## 2011-10-22 LAB — CBC
Hemoglobin: 11.8 g/dL — ABNORMAL LOW (ref 12.0–15.0)
MCH: 29 pg (ref 26.0–34.0)
RBC: 4.07 MIL/uL (ref 3.87–5.11)

## 2011-10-22 LAB — RPR

## 2011-10-27 ENCOUNTER — Telehealth: Payer: Self-pay | Admitting: Clinical

## 2011-10-27 NOTE — Telephone Encounter (Signed)
Message copied by Theresia Bough on Wed Oct 27, 2011 11:34 AM ------      Message from: BOOTH, [place] J      Created: Thu Oct 21, 2011 12:02 PM      Regarding: birth control assistance       Hi! This patient is interested in birth control after delivery (NOT the mirena), but does not have any insurance.  I was hoping you could give her a call and let her know about any assistance programs.  We talked a little about Nexplanon and I think that may be a good option for her, if she can afford it.            Thanks!      Denny Peon

## 2011-10-27 NOTE — Telephone Encounter (Signed)
Clinical Child psychotherapist (CSW) received a referral to assist pt in obtaining resources for birth control after she gives birth. CSW unable to reach pt however left a vm for pt.   Theresia Bough, MSW, Theresia Majors [phone]

## 2011-11-04 ENCOUNTER — Inpatient Hospital Stay (HOSPITAL_COMMUNITY)
Admission: AD | Admit: 2011-11-04 | Discharge: 2011-11-04 | Disposition: A | Discharge status: Home / Self Care | Payer: Self-pay | Source: Ambulatory Visit | Attending: Obstetrics and Gynecology | Admitting: Obstetrics and Gynecology

## 2011-11-04 ENCOUNTER — Encounter (HOSPITAL_COMMUNITY): Payer: Self-pay | Admitting: *Deleted

## 2011-11-04 DIAGNOSIS — N949 Unspecified condition associated with female genital organs and menstrual cycle: Secondary | ICD-10-CM | POA: Insufficient documentation

## 2011-11-04 DIAGNOSIS — O26899 Other specified pregnancy related conditions, unspecified trimester: Secondary | ICD-10-CM

## 2011-11-04 DIAGNOSIS — O99891 Other specified diseases and conditions complicating pregnancy: Secondary | ICD-10-CM | POA: Insufficient documentation

## 2011-11-04 DIAGNOSIS — R109 Unspecified abdominal pain: Secondary | ICD-10-CM | POA: Insufficient documentation

## 2011-11-04 DIAGNOSIS — O9989 Other specified diseases and conditions complicating pregnancy, childbirth and the puerperium: Secondary | ICD-10-CM

## 2011-11-04 LAB — URINALYSIS, ROUTINE W REFLEX MICROSCOPIC
Hgb urine dipstick: NEGATIVE
Leukocytes, UA: NEGATIVE
Nitrite: NEGATIVE
Specific Gravity, Urine: 1.015 (ref 1.005–1.030)
Urobilinogen, UA: 0.2 mg/dL (ref 0.0–1.0)

## 2011-11-04 LAB — WET PREP, GENITAL: Clue Cells Wet Prep HPF POC: NONE SEEN

## 2011-11-04 MED ORDER — ACETAMINOPHEN-CODEINE #3 300-30 MG PO TABS: 1.0000 | ORAL_TABLET | ORAL | Status: AC | PRN

## 2011-11-04 MED ORDER — OXYCODONE-ACETAMINOPHEN 5-325 MG PO TABS
1.0000 | ORAL_TABLET | Freq: Once | ORAL | Status: AC
  Administered 2011-11-04: 1 via ORAL
  Filled 2011-11-04: qty 1

## 2011-11-04 NOTE — MAU Provider Note (Signed)
History     CSN: [id number]  Arrival date and time: 11/04/11 1240   First Provider Initiated Contact with Patient 11/04/11 1426      Chief Complaint  Patient presents with  . Abdominal Pain   HPI 21 y.o. G2P1001 with suprapubic pain since yesterday, worse with movement, some abdominal pain/tightening yesterday, none now. No bleeding. + yellow discharge.    Past Medical History  Diagnosis Date  . No pertinent past medical history     Past Surgical History  Procedure Date  . No past surgeries     Family History  Problem Relation Age of Onset  . Diabetes Maternal Grandmother   . Hypertension Maternal Grandmother     History  Substance Use Topics  . Smoking status: Never Smoker   . Smokeless tobacco: Never Used  . Alcohol Use: No    Allergies: No Known Allergies  No prescriptions prior to admission    Review of Systems  Constitutional: Negative.   Respiratory: Negative.   Cardiovascular: Negative.   Gastrointestinal: Positive for abdominal pain. Negative for nausea, vomiting, diarrhea and constipation.  Genitourinary: Negative for dysuria, urgency, frequency, hematuria and flank pain.       Negative for vaginal bleeding, Positive for vaginal discharge  Musculoskeletal: Negative.   Neurological: Negative.   Psychiatric/Behavioral: Negative.    Physical Exam   Blood pressure 101/63, pulse 67, temperature 97.5 F (36.4 C), temperature source Oral, resp. rate 20, height 5\' 3"  (1.6 m), weight 152 lb 3.2 oz (69.037 kg), last menstrual period 03/24/2011, SpO2 100.00%, unknown if currently breastfeeding.  Physical Exam  Nursing note and vitals reviewed. Constitutional: She is oriented to person, place, and time. She appears well-developed and well-nourished. No distress.  Cardiovascular: Normal rate.   Respiratory: Effort normal.  GI: Soft. There is no tenderness.  Genitourinary: No bleeding around the vagina. Vaginal discharge (white) found.       SVE:1/very  long/high  Musculoskeletal: Normal range of motion.  Neurological: She is alert and oriented to person, place, and time.  Skin: Skin is warm and dry.  Psychiatric: She has a normal mood and affect.   EFM reactive, TOCO: no UCs MAU Course  Procedures  Results for orders placed during the hospital encounter of 11/04/11 (from the past 24 hour(s))  URINALYSIS, ROUTINE W REFLEX MICROSCOPIC     Status: Normal   Collection Time   11/04/11  1:05 PM      Component Value Range   Color, Urine YELLOW  YELLOW    APPearance CLEAR  CLEAR    Specific Gravity, Urine 1.015  1.005 - 1.030    pH 7.5  5.0 - 8.0    Glucose, UA NEGATIVE  NEGATIVE (mg/dL)   Hgb urine dipstick NEGATIVE  NEGATIVE    Bilirubin Urine NEGATIVE  NEGATIVE    Ketones, ur NEGATIVE  NEGATIVE (mg/dL)   Protein, ur NEGATIVE  NEGATIVE (mg/dL)   Urobilinogen, UA 0.2  0.0 - 1.0 (mg/dL)   Nitrite NEGATIVE  NEGATIVE    Leukocytes, UA NEGATIVE  NEGATIVE   WET PREP, GENITAL     Status: Abnormal   Collection Time   11/04/11  2:45 PM      Component Value Range   Yeast Wet Prep HPF POC NONE SEEN  NONE SEEN    Trich, Wet Prep NONE SEEN  NONE SEEN    Clue Cells Wet Prep HPF POC NONE SEEN  NONE SEEN    WBC, Wet Prep HPF POC TOO NUMEROUS TO COUNT (*)  NONE SEEN      Assessment and Plan  21 y.o. G2P1001 at [redacted]w[redacted]d Pelvic pain in pregnancy - no contractions, likely musculoskeletal Rx tylenol #3 Rev'd precautions F/U as scheduled  Kosei Rhodes 11/04/2011, 4:30 PM

## 2011-11-04 NOTE — MAU Note (Signed)
Onset of lower abdominal pain and pressure since last night  [redacted] weeks gestation, denies vaginal bleeding, denies dysuria.

## 2011-11-05 LAB — GC/CHLAMYDIA PROBE AMP, GENITAL
Chlamydia, DNA Probe: NEGATIVE
GC Probe Amp, Genital: NEGATIVE

## 2011-11-05 NOTE — MAU Provider Note (Signed)
Agree with above note.  Colleen Daniels 11/05/2011 9:08 AM

## 2011-11-16 ENCOUNTER — Telehealth: Payer: Self-pay | Admitting: Emergency Medicine

## 2011-11-16 NOTE — Telephone Encounter (Signed)
Patient has the symptoms of a yeast infection and would like the medication sent to [facility] at [facility].  I told her that the MD may want her to be seen first.

## 2011-11-16 NOTE — Telephone Encounter (Signed)
She is due for a prenatal appointment.  I would like to see her before prescribing the medicine, if possible.  If she is unable to get in this week, I will send the prescription, but she needs to be seen either this week or next week.

## 2011-11-19 ENCOUNTER — Ambulatory Visit (INDEPENDENT_AMBULATORY_CARE_PROVIDER_SITE_OTHER): Payer: Self-pay | Admitting: Emergency Medicine

## 2011-11-19 VITALS — BP 101/66 | Wt 154.0 lb

## 2011-11-19 DIAGNOSIS — B379 Candidiasis, unspecified: Secondary | ICD-10-CM

## 2011-11-19 DIAGNOSIS — Z3480 Encounter for supervision of other normal pregnancy, unspecified trimester: Secondary | ICD-10-CM

## 2011-11-19 DIAGNOSIS — N898 Other specified noninflammatory disorders of vagina: Secondary | ICD-10-CM

## 2011-11-19 DIAGNOSIS — B49 Unspecified mycosis: Secondary | ICD-10-CM

## 2011-11-19 DIAGNOSIS — Z23 Encounter for immunization: Secondary | ICD-10-CM

## 2011-11-19 LAB — POCT WET PREP (WET MOUNT): Clue Cells Wet Prep Whiff POC: NEGATIVE

## 2011-11-19 MED ORDER — FLUCONAZOLE 150 MG PO TABS: 150.0000 mg | ORAL_TABLET | Freq: Once | ORAL | Status: AC
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
## 2011-11-19 NOTE — Patient Instructions (Signed)
It was nice to see you!  I have sent a prescription for Diflucan to your pharmacy.  If you feel like the baby is not moving as much as usual, please do "kick counts." To feel the baby, sit in a quiet place with your feet up, free of distractions. If you feel the baby kick 5 times in 1 hour, you can stop. If not, feel for a second hour. 10 kicks in 2 hours is very reassuring and makes [place] feel that the baby is doing well.  If you would like, you can dry drinking a small amount of caffeine before counting for the 2nd hour to help baby wake up.  If you do not feel 10 kicks in 2 hours while concentrating, please go to the MAU.   If you have regular contractions that are 3-5 minutes apart for at least one hour, bleeding or fluid from your vagina, or you do not feel the baby moving enough, please go to the [facility].  I will see you back in 2 weeks to collect cultures.

## 2011-11-24 ENCOUNTER — Inpatient Hospital Stay (HOSPITAL_COMMUNITY)
Admission: AD | Admit: 2011-11-24 | Discharge: 2011-11-24 | DRG: 778 | Disposition: A | Discharge status: Home / Self Care | Payer: MEDICAID | Source: Ambulatory Visit | Attending: Obstetrics & Gynecology | Admitting: Obstetrics & Gynecology

## 2011-11-24 ENCOUNTER — Encounter (HOSPITAL_COMMUNITY): Payer: Self-pay | Admitting: *Deleted

## 2011-11-24 DIAGNOSIS — Z3480 Encounter for supervision of other normal pregnancy, unspecified trimester: Secondary | ICD-10-CM

## 2011-11-24 DIAGNOSIS — O4700 False labor before 37 completed weeks of gestation, unspecified trimester: Secondary | ICD-10-CM

## 2011-11-24 DIAGNOSIS — B379 Candidiasis, unspecified: Secondary | ICD-10-CM

## 2011-11-24 LAB — URINALYSIS, ROUTINE W REFLEX MICROSCOPIC
Bilirubin Urine: NEGATIVE
Ketones, ur: NEGATIVE mg/dL
Nitrite: NEGATIVE
Protein, ur: NEGATIVE mg/dL
Urobilinogen, UA: 0.2 mg/dL (ref 0.0–1.0)

## 2011-11-24 LAB — CBC
HCT: 36.9 % (ref 36.0–46.0)
Hemoglobin: 12.5 g/dL (ref 12.0–15.0)
RDW: 14.4 % (ref 11.5–15.5)
WBC: 11.8 10*3/uL — ABNORMAL HIGH (ref 4.0–10.5)

## 2011-11-24 LAB — URINE MICROSCOPIC-ADD ON

## 2011-11-24 LAB — RPR: RPR Ser Ql: NONREACTIVE

## 2011-11-24 MED ORDER — OXYTOCIN BOLUS FROM INFUSION
500.0000 mL | Freq: Once | INTRAVENOUS | Status: DC
  Filled 2011-11-24: qty 500

## 2011-11-24 MED ORDER — IBUPROFEN 600 MG PO TABS: 600.0000 mg | ORAL_TABLET | Freq: Four times a day (QID) | ORAL | Status: DC | PRN

## 2011-11-24 MED ORDER — LACTATED RINGERS IV SOLN: 500.0000 mL | Freq: Once | INTRAVENOUS | Status: DC

## 2011-11-24 MED ORDER — EPHEDRINE 5 MG/ML INJ: 10.0000 mg | INTRAVENOUS | Status: DC | PRN

## 2011-11-24 MED ORDER — LACTATED RINGERS IV SOLN
INTRAVENOUS | Status: DC
  Administered 2011-11-24 (×2): via INTRAVENOUS

## 2011-11-24 MED ORDER — FLEET ENEMA 7-19 GM/118ML RE ENEM: 1.0000 | ENEMA | RECTAL | Status: DC | PRN

## 2011-11-24 MED ORDER — PENICILLIN G POTASSIUM 5000000 UNITS IJ SOLR
2.5000 10*6.[IU] | INTRAVENOUS | Status: DC
  Administered 2011-11-24 (×2): 2.5 10*6.[IU] via INTRAVENOUS
  Filled 2011-11-24 (×5): qty 2.5

## 2011-11-24 MED ORDER — ONDANSETRON HCL 4 MG/2ML IJ SOLN: 4.0000 mg | Freq: Four times a day (QID) | INTRAMUSCULAR | Status: DC | PRN

## 2011-11-24 MED ORDER — OXYCODONE-ACETAMINOPHEN 5-325 MG PO TABS: 1.0000 | ORAL_TABLET | Freq: Four times a day (QID) | ORAL | Status: DC | PRN

## 2011-11-24 MED ORDER — FENTANYL 2.5 MCG/ML BUPIVACAINE 1/10 % EPIDURAL INFUSION (WH - ANES): 14.0000 mL/h | INTRAMUSCULAR | Status: DC

## 2011-11-24 MED ORDER — PENICILLIN G POTASSIUM 5000000 UNITS IJ SOLR
5.0000 10*6.[IU] | Freq: Once | INTRAVENOUS | Status: AC
  Administered 2011-11-24: 5 10*6.[IU] via INTRAVENOUS
  Filled 2011-11-24: qty 5

## 2011-11-24 MED ORDER — ACETAMINOPHEN 325 MG PO TABS: 650.0000 mg | ORAL_TABLET | ORAL | Status: DC | PRN

## 2011-11-24 MED ORDER — LIDOCAINE HCL (PF) 1 % IJ SOLN: 30.0000 mL | INTRAMUSCULAR | Status: DC | PRN

## 2011-11-24 MED ORDER — ZOLPIDEM TARTRATE 5 MG PO TABS
5.0000 mg | ORAL_TABLET | Freq: Every evening | ORAL | Status: DC | PRN
Start: 2011-11-24 — End: 2011-11-26

## 2011-11-24 MED ORDER — OXYTOCIN 20 UNITS IN LACTATED RINGERS INFUSION - SIMPLE: 125.0000 mL/h | Freq: Once | INTRAVENOUS | Status: DC

## 2011-11-24 MED ORDER — CITRIC ACID-SODIUM CITRATE 334-500 MG/5ML PO SOLN: 30.0000 mL | ORAL | Status: DC | PRN

## 2011-11-24 MED ORDER — OXYCODONE-ACETAMINOPHEN 5-325 MG PO TABS: 1.0000 | ORAL_TABLET | ORAL | Status: DC | PRN

## 2011-11-24 MED ORDER — LACTATED RINGERS IV SOLN: 500.0000 mL | INTRAVENOUS | Status: DC | PRN

## 2011-11-24 MED ORDER — PHENYLEPHRINE 40 MCG/ML (10ML) SYRINGE FOR IV PUSH (FOR BLOOD PRESSURE SUPPORT): 80.0000 ug | PREFILLED_SYRINGE | INTRAVENOUS | Status: DC | PRN

## 2011-11-24 MED ORDER — FERROUS SULFATE 325 (65 FE) MG PO TABS
325.0000 mg | ORAL_TABLET | Freq: Every day | ORAL | Status: DC
  Administered 2011-11-24: 325 mg via ORAL
  Filled 2011-11-24 (×2): qty 1

## 2011-11-24 MED ORDER — PRENATAL PLUS 27-1 MG PO TABS
1.0000 | ORAL_TABLET | Freq: Every day | ORAL | Status: DC
  Administered 2011-11-24: 1 via ORAL
  Filled 2011-11-24: qty 1

## 2011-11-24 MED ORDER — DIPHENHYDRAMINE HCL 50 MG/ML IJ SOLN: 12.5000 mg | INTRAMUSCULAR | Status: DC | PRN

## 2011-11-24 MED ORDER — NALBUPHINE SYRINGE 5 MG/0.5 ML: 5.0000 mg | INJECTION | INTRAMUSCULAR | Status: DC | PRN

## 2011-11-24 NOTE — MAU Note (Signed)
Pt reprts contractions x 1 hour, denies bleeding or leaking fluid

## 2011-11-24 NOTE — Discharge Summary (Signed)
Obstetric Discharge Summary Reason for Admission: preterm contractions Prenatal Procedures: ultrasound Patient admitted to L&D with regular contractions every 2-4 minutes.  She received IVF for hydration and her contractions spaced out to 4-12 minutes.  Her cervical exam remained unchanged after 10 hours.  Fetal tracing remained category I during the entire admission.  Will discharge home at this time.  Reviewed signs of labor with her.  She does have an appointment in clinic in 1 week.  Hemoglobin  Date Value Range Status  11/24/2011 12.5  12.0-15.0 (g/dL) Final     HCT  Date Value Range Status  11/24/2011 36.9  36.0-46.0 (%) Final    Physical Exam:  General: alert, cooperative, appears stated age and no distress Fetal monitor: 135, moderate variability, 15x15 accels Cervix: mucous present, no bloody show Dilation: 4 Effacement (%): 50 Cervical Position: Posterior Station: -2 Presentation: Vertex Exam by:: Dr Elwyn Reach   Discharge Diagnoses: latent labor  Discharge Information: Date: 11/24/2011 Activity: unrestricted Diet: routine Medications: Percocet and Ambien (for pain and rest) Condition: stable Instructions: return to MAU if you think your water has broken or you have contractions every 5 minutes or less for at least 1 hour that are getting stronger Discharge to: home   BOOTH, ERIN 11/24/2011, 4:04 PM  Agree with above and discussed with Dr. Elwyn Reach.

## 2011-11-24 NOTE — Progress Notes (Signed)
Patient ID: Colleen Daniels, female   DOB: May 16, 1991, 21 y.o.   MRN: [id number] Colleen Daniels Colleen Daniels is a 21 y.o. G2P1001 at [redacted]w[redacted]d admitted for preterm contractions  Subjective: Comfortable between and mild discomfort with UCs  Objective: BP 95/60  Pulse 69  Temp(Src) 97.7 F (36.5 C) (Oral)  Resp 20  Ht 5\' 3"  (1.6 m)  Wt 70.308 kg (155 lb)  BMI 27.46 kg/m2  SpO2 100%  LMP 03/24/2011  Fetal Heart FHR: 125-130 bpm, variability: moderate,  accelerations:  Present,  decelerations:  Absent   Contractions: q 3-4 x 30-50 sec  SVE:   Dilation: 4 Effacement (%): 60 Station: -2 Exam by:: Dpoe,cnm Cx: no change over 4 hr,  post, soft, no bulging or show  Assessment / Plan:  Labor: prodromal vx early. Will continue to hydrate and observe Fetal Wellbeing: Cat 1 Pain Control:  n/a Expected mode of delivery: NSVD  Colleen Daniels 11/24/2011, 10:02 AM

## 2011-11-24 NOTE — Progress Notes (Signed)
Colleen Daniels is a 21 y.o. G2P1001 at [redacted]w[redacted]d by LMP and concordant first trimester ultrasound admitted for Preterm labor  Subjective:  Uncomfortable with contractions, well between contractions.  Objective: BP 110/66  Pulse 70  Temp(Src) 97.5 F (36.4 C) (Oral)  Resp 18  Ht 5\' 3"  (1.6 m)  Wt 70.308 kg (155 lb)  BMI 27.46 kg/m2  SpO2 100%  LMP 03/24/2011      FHT:  FHR: 130 bpm, variability: moderate,  accelerations:  Present,  decelerations:  Absent UC:   regular, every 4-5 minutes VE: deferred Labs: Lab Results  Component Value Date   WBC 11.8* 11/24/2011   HGB 12.5 11/24/2011   HCT 36.9 11/24/2011   MCV 86.2 11/24/2011   PLT 202 11/24/2011    Assessment / Plan: Spontaneous labor, progressing normally  Labor: Progressing normally Fetal Wellbeing:  Category I Pain Control:  Labor support without medications Anticipated MOD:  NSVD  CRANSTOUN, LANDI 11/24/2011, 12:19 PM  Agree with above note

## 2011-11-24 NOTE — Discharge Instructions (Signed)
I'm sorry you aren't going to have your baby today.  You are in the "latent phase" of labor.  This might last anywhere from a few days to a few weeks.  This is actually a good thing for the baby as she still needs a couple of weeks to grow.  If you start having contractions every 5 minutes or less for more than an hour and they are getting stronger please come back to MAU.  Also, if you think your water has broken, please return to MAU to be evaluated.    I will see you next week in clinic.

## 2011-11-24 NOTE — Progress Notes (Signed)
"  pt reports seeing "white mucuos, blood and goo when wiping 3 times". Advised this may  Be from the last exams.  Pt holding tissue in hand and tissue has light pink on it.

## 2011-11-24 NOTE — Progress Notes (Signed)
Blue towel completely dry but pt feels as if fluid is leaking. Pt states she does not want to go home. Towel reapplied to perineum for continued evaluation.

## 2011-11-24 NOTE — Progress Notes (Signed)
At 1258 pt offered to eat and potential plan of care to d/c home. Pt states she is in to much pain and having to many contractions to eat or go home.
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
## 2011-11-24 NOTE — Progress Notes (Signed)
Given blue towel to wear to perineum to observe for vaginal discharge vs. Leaking of any fluid.

## 2011-11-24 NOTE — MAU Provider Note (Signed)
History    CSN: [id number] Arrival date and time: 11/24/11 4132  First Provider Initiated Contact with Patient 11/24/11 [phone]   Chief Complaint  Patient presents with  . Contractions   HPI Comments: Pt presents complaining of frequent contractions.  Initially denies leaking of fluid or bleeding but does report some "trickling of fluid" ~22 hours ago.  Contractions woke her up from sleep @ 0330.  Severe in intensity.  Baby movement normal.  Pt took diflucan last night for yeast infection.    OB History    Grav Para Term Preterm Abortions TAB SAB Ect Mult Living   2 1 1  0 0 0 0 0 0 1      Past Medical History  Diagnosis Date  . No pertinent past medical history     Past Surgical History  Procedure Date  . No past surgeries     Family History  Problem Relation Age of Onset  . Diabetes Maternal Grandmother   . Hypertension Maternal Grandmother     History  Substance Use Topics  . Smoking status: Never Smoker   . Smokeless tobacco: Never Used  . Alcohol Use: No    Allergies: No Known Allergies  Prescriptions prior to admission  Medication Sig Dispense Refill  . ferrous sulfate (FERROUSUL) 325 (65 FE) MG tablet Take 1 tablet (325 mg total) by mouth daily with breakfast.  30 tablet  6  . prenatal vitamin w/FE, FA (PRENATAL 1 + 1) 27-1 MG TABS Take 1 tablet by mouth daily.          Review of Systems  Constitutional: Negative for fever and chills.  HENT: Negative.   Eyes: Negative.   Respiratory: Negative.   Cardiovascular: Negative.   Gastrointestinal: Negative.   Genitourinary: Negative.   Musculoskeletal: Negative.   Neurological: Negative.   Endo/Heme/Allergies: Negative.   Psychiatric/Behavioral: Negative.    Physical Exam   Blood pressure 108/62, pulse 72, temperature 97.6 F (36.4 C), temperature source Oral, resp. rate 20, height 5\' 3"  (1.6 m), weight 70.308 kg (155 lb), last menstrual period 03/24/2011, SpO2 100.00%.  Physical Exam  Nursing note and  vitals reviewed. Constitutional: She appears well-developed and well-nourished. She appears distressed (with contractions).  HENT:  Head: Normocephalic and atraumatic.  Eyes: Right eye exhibits no discharge. Left eye exhibits no discharge. No scleral icterus.  Cardiovascular: Normal rate, regular rhythm and intact distal pulses.   Respiratory: Effort normal. No respiratory distress.  GI: Soft. She exhibits distension and mass. There is no tenderness. There is no rebound and no guarding.  Genitourinary: Vaginal discharge (thick and white) found.  Skin: Skin is warm and dry. No rash noted. She is not diaphoretic. No erythema. No pallor.  Psychiatric: She has a normal mood and affect. Her behavior is normal. Judgment and thought content normal.    MAU Course  Procedures Results for orders placed during the hospital encounter of 11/24/11 (from the past 24 hour(s))  URINALYSIS, ROUTINE W REFLEX MICROSCOPIC     Status: Abnormal   Collection Time   11/24/11  5:15 AM      Component Value Range   Color, Urine YELLOW  YELLOW    APPearance CLEAR  CLEAR    Specific Gravity, Urine <1.005 (*) 1.005 - 1.030    pH 6.0  5.0 - 8.0    Glucose, UA NEGATIVE  NEGATIVE (mg/dL)   Hgb urine dipstick NEGATIVE  NEGATIVE    Bilirubin Urine NEGATIVE  NEGATIVE    Ketones, ur NEGATIVE  NEGATIVE (mg/dL)   Protein, ur NEGATIVE  NEGATIVE (mg/dL)   Urobilinogen, UA 0.2  0.0 - 1.0 (mg/dL)   Nitrite NEGATIVE  NEGATIVE    Leukocytes, UA MODERATE (*) NEGATIVE   URINE MICROSCOPIC-ADD ON     Status: Abnormal   Collection Time   11/24/11  5:15 AM      Component Value Range   Squamous Epithelial / LPF FEW (*) RARE    WBC, UA 7-10  <3 (WBC/hpf)   RBC / HPF 3-6  <3 (RBC/hpf)   Bacteria, UA FEW (*) RARE    MDM Leukocytes in UA with FEW squamous epithelials Dilation: 4 Effacement (%): 60 Cervical Position: Posterior Exam by:: Leftwich-Kirby,CNM  Assessment and Plan  Will admit to L&D for Preterm Labor  Andrena Mews, DO [facility] Resident - PGY-1 11/24/2011 6:11 AM

## 2011-11-24 NOTE — Progress Notes (Signed)
At 1315 pt called out stating she was hungry and now wanted to eat. D poe, CNm consulted and she states pt to remain on clear liquid diet as long as pt feels uc's are painful. Pt updated.

## 2011-11-24 NOTE — Progress Notes (Signed)
Chief Complaint:  Contractions   First Provider Initiated Contact with Patient 11/24/11 0606      HPI  Colleen Daniels is a 21 y.o. G2P1001 at [redacted]w[redacted]d presenting with contractions. Denies leakage of fluid or vaginal bleeding. Good fetal movement. Contractions are painful, woke her up at 0300 today.  Pregnancy Course: uncomplicated with care at [facility], primary Dr. Elwyn Reach  Past Medical History: Past Medical History  Diagnosis Date  . No pertinent past medical history     Past Surgical History: Past Surgical History  Procedure Date  . No past surgeries     Family History: Family History  Problem Relation Age of Onset  . Diabetes Maternal Grandmother   . Hypertension Maternal Grandmother     Social History: History  Substance Use Topics  . Smoking status: Never Smoker   . Smokeless tobacco: Never Used  . Alcohol Use: No    Allergies: No Known Allergies  Meds:  Prescriptions prior to admission  Medication Sig Dispense Refill  . prenatal vitamin w/FE, FA (PRENATAL 1 + 1) 27-1 MG TABS Take 1 tablet by mouth daily.        . ferrous sulfate (FERROUSUL) 325 (65 FE) MG tablet Take 1 tablet (325 mg total) by mouth daily with breakfast.  30 tablet  6      Physical Exam  Blood pressure 110/64, pulse 68, temperature 97.5 F (36.4 C), temperature source Oral, resp. rate 20, height 5\' 3"  (1.6 m), weight 70.308 kg (155 lb), last menstrual period 03/24/2011, SpO2 100.00%. GENERAL: Well-developed, well-nourished female in no acute distress.  ABDOMEN: Soft, nontender, mildly palpable UCs EXTREMITIES: Nontender, no edema NEURO: alert and oriented  SPECULUM EXAM: Dilation: 4 Effacement (%): 50 Cervical Position: Posterior Station: -2 Presentation: Vertex Exam by:: dPoe,cnm cx unchaged, 1.5 cm long  FHT:  Baseline  135 , moderate variability, accelerations present, no decelerations Contractions: q 4-9 mins   Assessment: Not in established labor, now with less regular  UCs Cat 1 FHR   Plan: IVF and PCN infusing Declines discharge at this time due to pain with UCs.  Continue to observe. Will notify Dr. Elwyn Reach.     Colleen Daniels 6/5/20131:06 PM

## 2011-11-25 LAB — URINE CULTURE: Culture  Setup Time: 201306051058

## 2011-11-25 NOTE — Discharge Summary (Signed)
Agree with above note.  Colleen Daniels 11/25/2011 1:19 PM

## 2011-11-26 ENCOUNTER — Encounter (HOSPITAL_COMMUNITY): Payer: Self-pay | Admitting: *Deleted

## 2011-11-26 ENCOUNTER — Inpatient Hospital Stay (HOSPITAL_COMMUNITY)
Admission: AD | Admit: 2011-11-26 | Discharge: 2011-11-26 | Disposition: A | Discharge status: Home / Self Care | Payer: Self-pay | Source: Ambulatory Visit | Attending: Obstetrics & Gynecology | Admitting: Obstetrics & Gynecology

## 2011-11-26 DIAGNOSIS — O4700 False labor before 37 completed weeks of gestation, unspecified trimester: Secondary | ICD-10-CM | POA: Insufficient documentation

## 2011-11-26 DIAGNOSIS — O479 False labor, unspecified: Secondary | ICD-10-CM

## 2011-11-26 LAB — CULTURE, BETA STREP (GROUP B ONLY)

## 2011-11-26 NOTE — Discharge Instructions (Signed)

## 2011-11-26 NOTE — MAU Provider Note (Signed)
  History     CSN: [id number]  Arrival date and time: 11/26/11 1247   None     Chief Complaint  Patient presents with  . Rupture of Membranes  . Labor Eval   HPI This is a 21 year old G2 P1 001 at 35 weeks and 2 days who presents the MAU with concerns of ruptured membranes. The patient felt a big gush of fluid around 9:30 AM. She's had continued trickling since that time. She does report intermittent contractions. She was hospitalized earlier this week and was 4 cm but was discharged after her cervix did not change overnight. She denies decreased fetal activity, and vaginal bleeding, vaginal discharge. [facility]  OB History    Grav Para Term Preterm Abortions TAB SAB Ect Mult Living   2 1 1  0 0 0 0 0 0 1      Past Medical History  Diagnosis Date  . No pertinent past medical history     Past Surgical History  Procedure Date  . No past surgeries     Family History  Problem Relation Age of Onset  . Diabetes Maternal Grandmother   . Hypertension Maternal Grandmother     History  Substance Use Topics  . Smoking status: Never Smoker   . Smokeless tobacco: Never Used  . Alcohol Use: No    Allergies: No Known Allergies  Prescriptions prior to admission  Medication Sig Dispense Refill  . Prenatal Vit-Fe Fumarate-FA (PRENATAL MULTIVITAMIN) TABS Take 1 tablet by mouth daily.      Marland Kitchen oxyCODONE-acetaminophen (PERCOCET) 5-325 MG per tablet Take 1 tablet by mouth every 6 (six) hours as needed for pain.  15 tablet  0    ROS Physical Exam   Blood pressure 101/61, pulse 75, temperature 98.6 F (37 C), temperature source Oral, resp. rate 16, height 5' 2.5" (1.588 m), weight 69.945 kg (154 lb 3.2 oz), last menstrual period 03/24/2011, SpO2 100.00%.  Physical Exam  Constitutional: She is oriented to person, place, and time. She appears well-developed and well-nourished.  HENT:  Head: Normocephalic and atraumatic.  GI: Soft. She exhibits no  distension and no mass. There is no tenderness. There is no rebound and no guarding.  Genitourinary:       Normal external female genitalia with no lesions noted. Normal vaginal mucosa. Normal cervix. Mucousy discharge.  Musculoskeletal: She exhibits no edema.  Neurological: She is alert and oriented to person, place, and time.  Psychiatric: She has a normal mood and affect. Her behavior is normal. Judgment and thought content normal.   Dilation: 4 Exam by:: Dr Adrian Blackwater  Results for orders placed during the hospital encounter of 11/26/11 (from the past 24 hour(s))  AMNISURE RUPTURE OF MEMBRANE (ROM)     Status: Normal   Collection Time   11/26/11  1:50 PM      Component Value Range   Amnisure ROM NEGATIVE       MAU Course  Procedures NST: Baseline rate of 150 with moderate variability. Accelerations seen. No decelerations.  MDM Ferning negative. Amnisure negative. No evidence of ruptured membranes on exam.  Assessment and Plan  #1 intrauterine pregnancy at 35 weeks and 2 days #2 Braxton Hicks contractions  Patient discharged to home with no evidence of labor or ruptured membranes. Patient to followup with family practice clinic at next appointment.  Jodey Burbano JEHIEL 11/26/2011, 2:00 PM

## 2011-11-26 NOTE — MAU Note (Signed)
Pt complains of large gush of clear fluid at 930 am.  Has been contracting on and off for awhile. Was evaluated in antenatal on Wednesday of this week and cervix didn't change so she was discharged home.

## 2011-11-26 NOTE — MAU Note (Signed)
Patient states she had a gush of clear fluid at 0930. Has continued to have a little leaking but not active leaking at this time. States she started to have some irregular contractions. Was admitted to the hospital on 6-5 and was 4 cm. Was observed but did not change her cervix and was discharged. Reports good fetal movement.

## 2011-11-28 ENCOUNTER — Inpatient Hospital Stay (HOSPITAL_COMMUNITY)
Admission: AD | Admit: 2011-11-28 | Discharge: 2011-11-28 | Disposition: A | Discharge status: Hospice | Payer: Self-pay | Source: Ambulatory Visit | Attending: Obstetrics & Gynecology | Admitting: Obstetrics & Gynecology

## 2011-11-28 ENCOUNTER — Encounter (HOSPITAL_COMMUNITY): Payer: Self-pay | Admitting: *Deleted

## 2011-11-28 DIAGNOSIS — B379 Candidiasis, unspecified: Secondary | ICD-10-CM

## 2011-11-28 DIAGNOSIS — O4700 False labor before 37 completed weeks of gestation, unspecified trimester: Secondary | ICD-10-CM | POA: Insufficient documentation

## 2011-11-28 DIAGNOSIS — Z3480 Encounter for supervision of other normal pregnancy, unspecified trimester: Secondary | ICD-10-CM

## 2011-11-28 HISTORY — DX: Unspecified viral hepatitis B without hepatic coma: B19.10

## 2011-11-28 LAB — URINALYSIS, ROUTINE W REFLEX MICROSCOPIC
Hgb urine dipstick: NEGATIVE
Ketones, ur: NEGATIVE mg/dL
Leukocytes, UA: NEGATIVE
Protein, ur: NEGATIVE mg/dL
Urobilinogen, UA: 0.2 mg/dL (ref 0.0–1.0)

## 2011-11-28 LAB — POCT FERN TEST: Fern Test: NEGATIVE

## 2011-11-28 MED ORDER — NALBUPHINE SYRINGE 5 MG/0.5 ML
5.0000 mg | INJECTION | Freq: Once | INTRAMUSCULAR | Status: DC
  Filled 2011-11-28: qty 0.5

## 2011-11-28 NOTE — MAU Provider Note (Signed)
History     CC: Rupture of membranes, contractions  CSN: [id number]  Arrival date and time: 11/28/11 1953   None     Chief Complaint  Patient presents with  . Contractions   HPI  21 year old female G2P1 at [redacted]w[redacted]d by LMP and concordant first trimester [place] presents with contractions starting at 6:12 p.m.  Contractions are felt most strongly across lower abdomen.    Baby is moving normally, last felt 30 minutes ago.  She also reports a gush of fluid just now when the RN was here, but the RN did not see any fluid.  No vaginal bleeding.  OB History    Grav Para Term Preterm Abortions TAB SAB Ect Mult Living   2 1 1  0 0 0 0 0 0 1      Past Medical History  Diagnosis Date  . No pertinent past medical history   . Hepatitis B     Past Surgical History  Procedure Date  . No past surgeries     Family History  Problem Relation Age of Onset  . Diabetes Maternal Grandmother   . Hypertension Maternal Grandmother     History  Substance Use Topics  . Smoking status: Never Smoker   . Smokeless tobacco: Never Used  . Alcohol Use: No    Allergies: No Known Allergies  Prescriptions prior to admission  Medication Sig Dispense Refill  . Prenatal Vit-Fe Fumarate-FA (PRENATAL MULTIVITAMIN) TABS Take 1 tablet by mouth daily.        Review of Systems  Constitutional: Negative for fever, chills and diaphoresis.  HENT: Negative for ear pain, congestion, sore throat, neck pain and tinnitus.   Eyes: Negative for blurred vision, double vision and pain.  Respiratory: Negative for cough and shortness of breath.   Cardiovascular: Negative for chest pain and palpitations.  Gastrointestinal: Positive for nausea and diarrhea (Intermittent, chronic). Negative for heartburn, vomiting and constipation.  Genitourinary: Positive for frequency. Negative for dysuria.  Musculoskeletal: Positive for back pain (Chronic in late pregnancy). Negative for myalgias, joint pain and falls.  Skin:  Negative for rash.  Neurological: Negative for tingling, sensory change, focal weakness, seizures, loss of consciousness and headaches.  Psychiatric/Behavioral: Negative for depression. The patient is not nervous/anxious.    Physical Exam   Blood pressure 105/62, pulse 79, temperature 98.5 F (36.9 C), temperature source Oral, resp. rate 20, height 5' 2.5" (1.588 m), weight 71.668 kg (158 lb), last menstrual period 03/24/2011, SpO2 99.00%.  Physical Exam  Constitutional: She is oriented to person, place, and time. She appears well-developed and well-nourished. No distress.  HENT:  Head: Normocephalic and atraumatic.  Eyes: Conjunctivae and EOM are normal. Right eye exhibits no discharge. Left eye exhibits no discharge. No scleral icterus.  Neck: No tracheal deviation present.  Cardiovascular: Normal rate, regular rhythm and normal heart sounds.   Respiratory: Effort normal and breath sounds normal. No stridor.  GI: Soft. She exhibits no distension. There is no tenderness. There is no rebound and no guarding.       Gravid  Neurological: She is alert and oriented to person, place, and time.  Skin: Skin is warm and dry. She is not diaphoretic.  Psychiatric: She has a normal mood and affect. Her behavior is normal. Judgment and thought content normal.     VE: 4/70%/posterior/soft/vertex.  No pooling fluid.  Fern negative. Electronic fetal monitoring: moderate variability, accelerations present, no decels Toco: Uterine irritability, frequent, brief contractions lasting ~30 seconds. MAU Course  Procedures None   Assessment and Plan  21 year old G2P1 with contractions and perceived loss of fluid, concern for preterm labor  #Contractions without cervical change - Gave Nubain for pain control - Braxton-Hicks contractions  #Perceived loss of fluid - No pooling, fern negative - Not ruptured  #Dispo -Appropriate for discharge home    Jasmane Brockway 11/28/2011, 8:51 PM

## 2011-11-28 NOTE — MAU Note (Signed)
Pt states she has been contracting all day and the ctx have been worsening.  Denies bleeding or leaking

## 2011-11-28 NOTE — Discharge Instructions (Signed)
Please return to MAU or call your prenatal provider if you experience your water breaking, feel you are in labor, bleed from your vagina.Braxton Hicks Contractions Pregnancy is commonly associated with contractions of the uterus throughout the pregnancy. Towards the end of pregnancy (32 to 34 weeks), these contractions [facility] Willa Rough) can develop more often and may become more forceful. This is not true labor because these contractions do not result in opening (dilatation) and thinning of the cervix. They are sometimes difficult to tell apart from true labor because these contractions can be forceful and people have different pain tolerances. You should not feel embarrassed if you go to the hospital with false labor. Sometimes, the only way to tell if you are in true labor is for your caregiver to follow the changes in the cervix. How to tell the difference between true and false labor:  False labor.   The contractions of false labor are usually shorter, irregular and not as hard as those of true labor.   They are often felt in the front of the lower abdomen and in the groin.   They may leave with walking around or changing positions while lying down.   They get weaker and are shorter lasting as time goes on.   These contractions are usually irregular.   They do not usually become progressively stronger, regular and closer together as with true labor.   True labor.   Contractions in true labor last 30 to 70 seconds, become very regular, usually become more intense, and increase in frequency.   They do not go away with walking.   The discomfort is usually felt in the top of the uterus and spreads to the lower abdomen and low back.   True labor can be determined by your caregiver with an exam. This will show that the cervix is dilating and getting thinner.  If there are no prenatal problems or other health problems associated with the pregnancy, it is completely safe to be sent home with  false labor and await the onset of true labor. HOME CARE INSTRUCTIONS   Keep up with your usual exercises and instructions.   Take medications as directed.   Keep your regular prenatal appointment.   Eat and drink lightly if you think you are going into labor.   If BH contractions are making you uncomfortable:   Change your activity position from lying down or resting to walking/walking to resting.   Sit and rest in a tub of warm water.   Drink 2 to 3 glasses of water. Dehydration may cause B-H contractions.   Do slow and deep breathing several times an hour.  SEEK IMMEDIATE MEDICAL CARE IF:   Your contractions continue to become stronger, more regular, and closer together.   You have a gushing, burst or leaking of fluid from the vagina.   An oral temperature above 102 F (38.9 C) develops.   You have passage of blood-tinged mucus.   You develop vaginal bleeding.   You develop continuous belly (abdominal) pain.   You have low back pain that you never had before.   You feel the baby's head pushing down causing pelvic pressure.   The baby is not moving as much as it used to.  Document Released: 06/07/2005 Document Revised: 05/27/2011 Document Reviewed: 11/29/2008 [facility] Patient Information 2012 [place], [place].

## 2011-11-28 NOTE — MAU Note (Signed)
Pt reports she has been having contractions x 8 hours, denies bleeding but reports feeling fluid leaking since 1930.

## 2011-12-01 ENCOUNTER — Ambulatory Visit: Payer: Self-pay | Admitting: Emergency Medicine

## 2011-12-01 NOTE — Patient Instructions (Addendum)
If you feel like the baby is not moving as much as usual, please do "kick counts." To feel the baby, sit in a quiet place with your feet up, free of distractions. If you feel the baby kick 5 times in 1 hour, you can stop. If not, feel for a second hour. 10 kicks in 2 hours is very reassuring and makes [place] feel that the baby is doing well.  If you would like, you can dry drinking a small amount of caffeine before counting for the 2nd hour to help baby wake up.  If you do not feel 10 kicks in 2 hours while concentrating, please go to the MAU.   If you have regular contractions that are 3-5 minutes apart for at least one hour, bleeding or fluid from your vagina, or you do not feel the baby moving enough, please go to the [facility].  Follow up next week in OB clinic.

## 2011-12-01 NOTE — Progress Notes (Signed)
S: 21 yo G2P1001 at [redacted]w[redacted]d.  Continues to have irregular contractions.  Is able to rest.  Has not taken any percocet or ambien.  Also has some low back pain that is worse with activity.  + fetal movement.  No vaginal bleeding or discharge.  O: see flowsheet  A/P: 20 G2P1001 at 36w - continue prenatal - GBS negative - GC/Chlamydia negative at 32 weeks - kick counts and labor precautions reviewed - f/u in 1 week for routine care

## 2011-12-05 ENCOUNTER — Inpatient Hospital Stay (HOSPITAL_COMMUNITY)
Admission: AD | Admit: 2011-12-05 | Discharge: 2011-12-05 | DRG: 778 | Disposition: A | Discharge status: Home / Self Care | Payer: MEDICAID | Source: Ambulatory Visit | Attending: Obstetrics & Gynecology | Admitting: Obstetrics & Gynecology

## 2011-12-05 ENCOUNTER — Encounter (HOSPITAL_COMMUNITY): Payer: Self-pay

## 2011-12-05 DIAGNOSIS — Z3480 Encounter for supervision of other normal pregnancy, unspecified trimester: Secondary | ICD-10-CM

## 2011-12-05 DIAGNOSIS — O4700 False labor before 37 completed weeks of gestation, unspecified trimester: Secondary | ICD-10-CM

## 2011-12-05 DIAGNOSIS — B379 Candidiasis, unspecified: Secondary | ICD-10-CM

## 2011-12-05 LAB — RPR: RPR Ser Ql: NONREACTIVE

## 2011-12-05 LAB — CBC
Hemoglobin: 11.7 g/dL — ABNORMAL LOW (ref 12.0–15.0)
MCH: 28.4 pg (ref 26.0–34.0)
Platelets: 208 10*3/uL (ref 150–400)
RBC: 4.12 MIL/uL (ref 3.87–5.11)
WBC: 11.9 10*3/uL — ABNORMAL HIGH (ref 4.0–10.5)

## 2011-12-05 MED ORDER — OXYCODONE-ACETAMINOPHEN 5-325 MG PO TABS: 1.0000 | ORAL_TABLET | ORAL | Status: DC | PRN

## 2011-12-05 MED ORDER — LIDOCAINE HCL (PF) 1 % IJ SOLN: 30.0000 mL | INTRAMUSCULAR | Status: DC | PRN

## 2011-12-05 MED ORDER — ONDANSETRON HCL 4 MG/2ML IJ SOLN: 4.0000 mg | Freq: Four times a day (QID) | INTRAMUSCULAR | Status: DC | PRN

## 2011-12-05 MED ORDER — OXYTOCIN 20 UNITS IN LACTATED RINGERS INFUSION - SIMPLE: 125.0000 mL/h | Freq: Once | INTRAVENOUS | Status: DC

## 2011-12-05 MED ORDER — LACTATED RINGERS IV SOLN
500.0000 mL | INTRAVENOUS | Status: DC | PRN
  Administered 2011-12-05: 1000 mL via INTRAVENOUS

## 2011-12-05 MED ORDER — ACETAMINOPHEN 325 MG PO TABS: 650.0000 mg | ORAL_TABLET | ORAL | Status: DC | PRN

## 2011-12-05 MED ORDER — IBUPROFEN 600 MG PO TABS: 600.0000 mg | ORAL_TABLET | Freq: Four times a day (QID) | ORAL | Status: DC | PRN

## 2011-12-05 MED ORDER — CITRIC ACID-SODIUM CITRATE 334-500 MG/5ML PO SOLN: 30.0000 mL | ORAL | Status: DC | PRN

## 2011-12-05 MED ORDER — OXYTOCIN BOLUS FROM INFUSION
500.0000 mL | Freq: Once | INTRAVENOUS | Status: DC
  Filled 2011-12-05: qty 500

## 2011-12-05 MED ORDER — FLEET ENEMA 7-19 GM/118ML RE ENEM: 1.0000 | ENEMA | RECTAL | Status: DC | PRN

## 2011-12-05 MED ORDER — LACTATED RINGERS IV SOLN
INTRAVENOUS | Status: DC
  Administered 2011-12-05: 09:00:00 via INTRAVENOUS

## 2011-12-05 NOTE — H&P (Signed)
  Colleen Daniels is a 21 y.o. female presenting for contractions. Maternal Medical History:  Reason for admission: Reason for admission: contractions.  Contractions: Onset was 3-5 hours ago.   Frequency: regular.   Perceived severity is strong.    Fetal activity: Perceived fetal activity is normal.   Last perceived fetal movement was within the past hour.    Prenatal complications: no prenatal complications Prenatal Complications - Diabetes: none.    OB History    Grav Para Term Preterm Abortions TAB SAB Ect Mult Living   2 1 1  0 0 0 0 0 0 1     Past Medical History  Diagnosis Date  . No pertinent past medical history   . Hepatitis B    Past Surgical History  Procedure Date  . No past surgeries    Family History: family history includes Diabetes in her maternal grandmother and Hypertension in her maternal grandmother. Social History:  reports that she has never smoked. She has never used smokeless tobacco. She reports that she does not drink alcohol or use illicit drugs.   Prenatal Transfer Tool  Maternal Diabetes: No Genetic Screening: Declined Maternal Ultrasounds/Referrals: Normal Fetal Ultrasounds or other Referrals:  None Maternal Substance Abuse:  No Significant Maternal Medications:  None Significant Maternal Lab Results:  Lab values include: Group B Strep negative Other Comments:  None  Review of Systems  All other systems reviewed and are negative.    Dilation: 5 Effacement (%): 80 Station: -2 Exam by:: dr booth Blood pressure 108/62, pulse 71, temperature 98 F (36.7 C), temperature source Oral, resp. rate 20, height 5' 2.5" (1.588 m), weight 157 lb (71.215 kg), last menstrual period 03/24/2011. Maternal Exam:  Uterine Assessment: Contraction strength is moderate.  Contraction frequency is regular.   Abdomen: Patient reports no abdominal tenderness. Fundal height is s = d.   Estimated fetal weight is 7lb.   Fetal presentation:  vertex  Introitus: Normal vulva. Normal vagina.  Pelvis: adequate for delivery.   Cervix: Cervix evaluated by digital exam.     Fetal Exam Fetal Monitor Review: Mode: ultrasound.   Baseline rate: 140.  Variability: moderate (6-25 bpm).   Pattern: accelerations present and no decelerations.    Fetal State Assessment: Category I - tracings are normal.     Physical Exam  Constitutional: She is oriented to person, place, and time. She appears well-developed and well-nourished. She appears distressed (with contractions).  HENT:  Head: Normocephalic and atraumatic.  Eyes: Conjunctivae are normal. Right eye exhibits no discharge. Left eye exhibits no discharge.  GI:       Gravid   Musculoskeletal: Normal range of motion. She exhibits no edema.  Neurological: She is alert and oriented to person, place, and time.  Skin: Skin is warm and dry. No rash noted.    Prenatal labs: ABO, Rh: A/POS/-- (02/22 1338) Antibody: NEG (02/22 1338) Rubella: 36.3 (02/22 1338) RPR: NON REACTIVE (06/05 0610)  HBsAg: NEGATIVE (02/22 1338)  HIV: NON REACTIVE (05/02 1153)  GBS: Negative (06/12 0000)   Assessment/Plan: 21 yo G2P1001 at [redacted]w[redacted]d for active labor Admit to L&D   BOOTH, ERIN 12/05/2011, 9:19 AM   I did not do cx exam. Reasonable to admit based on above notation.

## 2011-12-05 NOTE — Progress Notes (Signed)
Notified of patient, her being very uncomfortable, tracing, ctx pattern and sve result. Order to admit to l/d. Will enter orders.

## 2011-12-05 NOTE — Progress Notes (Signed)
Colleen Daniels is a 21 y.o. G2P1001 at [redacted]w[redacted]d  Subjective: Doing well, contractions are severe  Objective: BP 108/66  Pulse 72  Temp 97.6 F (36.4 C) (Oral)  Resp 18  Ht 5' 2.5" (1.588 m)  Wt 71.215 kg (157 lb)  BMI 28.26 kg/m2  LMP 03/24/2011      FHT:  FHR: 130 bpm, variability: moderate,  accelerations:  Present,  decelerations:  Absent UC:   regular, every 3-5 minutes SVE:   Dilation: 5 Effacement (%): 80 Station: -1;0 Exam by:: patti moore rn  Labs: Lab Results  Component Value Date   WBC 11.9* 12/05/2011   HGB 11.7* 12/05/2011   HCT 35.2* 12/05/2011   MCV 85.4 12/05/2011   PLT 208 12/05/2011    Assessment / Plan: Spontaneous labor, progressing normally  Labor: Progressing normally but slowly, no  Preeclampsia:  n/a Fetal Wellbeing:  Category I Pain Control:  Labor support without medications I/D:  n/a Anticipated MOD:  NSVD  Andrena Mews, DO [facility] Resident - PGY-1 12/05/2011 12:15 PM

## 2011-12-05 NOTE — Discharge Summary (Signed)
Obstetric Discharge Summary Reason for Admission: preterm contractions Prenatal Procedures: ultrasound  Hospital course: Patient was admitted to labor and delivery with a cervical exam showing 4/70/-2.  She did progress to 5/80/-1.  However, there was no additional cervical change after 7.5 hours of monitoring and her contractions spaced out to every 6-9 minutes.  As she is still preterm, did NOT attempt any measures to augment labor, including AROM and pitocin.  H/H: Lab Results  Component Value Date/Time   HGB 11.7* 12/05/2011  4:58 AM   HCT 35.2* 12/05/2011  4:58 AM      Discharge Diagnoses: braxton hicks contractions  Discharge Information: Date: 12/31/2010 Activity: unrestricted Diet: routine Medications: PNV and Ambien Condition: stable Instructions: try and rest (can take Ambien if needed); labor precautions given Discharge to: home   BOOTH, ERIN 12/05/2011,4:30 PM   Discussed with resident and agree with her management decision. Reviewed EFM tracing.

## 2011-12-05 NOTE — MAU Note (Signed)
Patient is in for labor eval. States that ctx are q2-58m for 2hrs. She reports good fetal movement. Denies any vaginal bleeding or lof.

## 2011-12-05 NOTE — Discharge Instructions (Signed)
It's not quite time for your daughter to be born.  I would really recommended filling the prescription for Ambien and taking one tonight so you can get a good night's rest.  If the contractions get closer together again (coming every 5 minutes or less for at least 1 hour) or your water breaks, please come back to [facility] for evaluation.    If you feel like the baby is not moving as much as usual, please do "kick counts." To feel the baby, sit in a quiet place with your feet up, free of distractions. If you feel the baby kick 5 times in 1 hour, you can stop. If not, feel for a second hour. 10 kicks in 2 hours is very reassuring and makes [place] feel that the baby is doing well.  If you would like, you can dry drinking a small amount of caffeine before counting for the 2nd hour to help baby wake up.  If you do not feel 10 kicks in 2 hours while concentrating, please go to the MAU.

## 2011-12-05 NOTE — Progress Notes (Signed)
Colleen Daniels is a 21 y.o. G2P1001 at [redacted]w[redacted]d admitted for Preterm labor  Subjective: Doing well.  Contractions still painful.  Objective: BP 112/64  Pulse 69  Temp 97.6 F (36.4 C) (Oral)  Resp 18  Ht 5' 2.5" (1.588 m)  Wt 157 lb (71.215 kg)  BMI 28.26 kg/m2  LMP 03/24/2011      FHT:  FHR: 135 bpm, variability: moderate,  accelerations:  Present,  decelerations:  Absent UC:   Every 8-10 minutes SVE:   Dilation: 5 Effacement (%): 80 Station: -1 Exam by:: dr booth  Labs: Lab Results  Component Value Date   WBC 11.9* 12/05/2011   HGB 11.7* 12/05/2011   HCT 35.2* 12/05/2011   MCV 85.4 12/05/2011   PLT 208 12/05/2011    Assessment / Plan: Protracted latent phase  Labor: will have patient walk for a couple hours.  If no change, will need to consider dicharge home Fetal Wellbeing:  Category I Pain Control:  Labor support without medications I/D:  n/a Anticipated MOD:  NSVD  BOOTH, Dejanique Ruehl 12/05/2011, 1:37 PM

## 2011-12-06 NOTE — Discharge Summary (Signed)
Agree with above note.  Colleen Daniels H. 12/06/2011 7:35 AM

## 2011-12-06 NOTE — H&P (Signed)
Medical Screening exam and patient care preformed by advanced practice provider.  Agree with the above management.  

## 2011-12-06 NOTE — MAU Provider Note (Signed)
I examined pt and agree with documentation above and resident plan of care. MUHAMMAD,Cung Masterson  

## 2011-12-09 ENCOUNTER — Ambulatory Visit (INDEPENDENT_AMBULATORY_CARE_PROVIDER_SITE_OTHER): Payer: Self-pay | Admitting: Family Medicine

## 2011-12-09 VITALS — BP 111/77 | Wt 156.8 lb

## 2011-12-09 DIAGNOSIS — Z3480 Encounter for supervision of other normal pregnancy, unspecified trimester: Secondary | ICD-10-CM

## 2011-12-10 ENCOUNTER — Inpatient Hospital Stay (HOSPITAL_COMMUNITY)
Admission: AD | Admit: 2011-12-10 | Discharge: 2011-12-10 | DRG: 780 | Disposition: A | Discharge status: Home / Self Care | Payer: Medicaid Other | Source: Ambulatory Visit | Attending: Obstetrics & Gynecology | Admitting: Obstetrics & Gynecology

## 2011-12-10 ENCOUNTER — Encounter (HOSPITAL_COMMUNITY): Payer: Self-pay | Admitting: *Deleted

## 2011-12-10 DIAGNOSIS — B379 Candidiasis, unspecified: Secondary | ICD-10-CM

## 2011-12-10 DIAGNOSIS — O479 False labor, unspecified: Principal | ICD-10-CM | POA: Diagnosis present

## 2011-12-10 DIAGNOSIS — IMO0001 Reserved for inherently not codable concepts without codable children: Secondary | ICD-10-CM

## 2011-12-10 DIAGNOSIS — Z3480 Encounter for supervision of other normal pregnancy, unspecified trimester: Secondary | ICD-10-CM

## 2011-12-10 LAB — CBC
MCH: 29.3 pg (ref 26.0–34.0)
MCV: 85.2 fL (ref 78.0–100.0)
Platelets: 197 10*3/uL (ref 150–400)
RDW: 14.3 % (ref 11.5–15.5)
WBC: 10.9 10*3/uL — ABNORMAL HIGH (ref 4.0–10.5)

## 2011-12-10 LAB — TYPE AND SCREEN
ABO/RH(D): A POS
Antibody Screen: NEGATIVE

## 2011-12-10 MED ORDER — OXYCODONE-ACETAMINOPHEN 5-325 MG PO TABS: 1.0000 | ORAL_TABLET | ORAL | Status: DC | PRN

## 2011-12-10 MED ORDER — CITRIC ACID-SODIUM CITRATE 334-500 MG/5ML PO SOLN: 30.0000 mL | ORAL | Status: DC | PRN

## 2011-12-10 MED ORDER — ACETAMINOPHEN 325 MG PO TABS: 650.0000 mg | ORAL_TABLET | ORAL | Status: DC | PRN

## 2011-12-10 MED ORDER — LACTATED RINGERS IV SOLN
INTRAVENOUS | Status: DC
  Administered 2011-12-10: 08:00:00 via INTRAVENOUS

## 2011-12-10 MED ORDER — IBUPROFEN 600 MG PO TABS: 600.0000 mg | ORAL_TABLET | Freq: Four times a day (QID) | ORAL | Status: DC | PRN

## 2011-12-10 MED ORDER — LACTATED RINGERS IV SOLN: 500.0000 mL | INTRAVENOUS | Status: DC | PRN

## 2011-12-10 MED ORDER — FLEET ENEMA 7-19 GM/118ML RE ENEM: 1.0000 | ENEMA | RECTAL | Status: DC | PRN

## 2011-12-10 MED ORDER — OXYTOCIN 40 UNITS IN LACTATED RINGERS INFUSION - SIMPLE MED: 62.5000 mL/h | Freq: Once | INTRAVENOUS | Status: DC

## 2011-12-10 MED ORDER — LIDOCAINE HCL (PF) 1 % IJ SOLN: 30.0000 mL | INTRAMUSCULAR | Status: DC | PRN

## 2011-12-10 MED ORDER — OXYTOCIN BOLUS FROM INFUSION
250.0000 mL | Freq: Once | INTRAVENOUS | Status: DC
  Filled 2011-12-10: qty 500

## 2011-12-10 MED ORDER — ONDANSETRON HCL 4 MG/2ML IJ SOLN: 4.0000 mg | Freq: Four times a day (QID) | INTRAMUSCULAR | Status: DC | PRN

## 2011-12-10 NOTE — H&P (Addendum)
Colleen Daniels is a 21 y.o. G2P1 female at 37w 2d by LMP and concordant first trimester ultrasound, presenting for contractions.  Found to have cervical change over MAU course.  Maternal Medical History:  Reason for admission: Reason for admission: contractions.  Reason for Admission:   nauseaContractions: Onset was yesterday.   Frequency: regular.   Perceived severity is moderate.    Fetal activity: Perceived fetal activity is normal.   Last perceived fetal movement was within the past hour.    Prenatal complications: No bleeding, cholelithiasis, HIV, hypertension, infection, IUGR, nephrolithiasis, oligohydramnios, placental abnormality, polyhydramnios, pre-eclampsia, preterm labor, substance abuse, thrombocytopenia or thrombophilia.     OB History    Grav Para Term Preterm Abortions TAB SAB Ect Mult Living   2 1 1  0 0 0 0 0 0 1     Past Medical History  Diagnosis Date  . No pertinent past medical history   . Hepatitis B    Past Surgical History  Procedure Date  . No past surgeries    Family History: family history includes Diabetes in her maternal grandmother and Hypertension in her maternal grandmother. Social History:  reports that she has never smoked. She has never used smokeless tobacco. She reports that she does not drink alcohol or use illicit drugs.   Prenatal Transfer Tool  Maternal Diabetes: No Genetic Screening: Declined Maternal Ultrasounds/Referrals: Normal Fetal Ultrasounds or other Referrals:  None Maternal Substance Abuse:  No Significant Maternal Medications:  None Significant Maternal Lab Results:  None GBS negative, Hep B negative despite history of hepatitis B. Other Comments:  None  Review of Systems  Constitutional: Negative for fever and chills.  HENT: Negative for congestion and sore throat.   Eyes: Negative for blurred vision and double vision.  Respiratory: Negative for cough, hemoptysis, shortness of breath and wheezing.     Cardiovascular: Negative for chest pain and palpitations.  Gastrointestinal: Positive for abdominal pain (Only with contractions) and constipation (Last bowel movement on Wed.). Negative for heartburn, nausea, vomiting, diarrhea and blood in stool.  Genitourinary: Negative for dysuria and hematuria.  Musculoskeletal: Negative for myalgias and joint pain.  Skin: Negative for itching and rash.  Neurological: Negative for dizziness, sensory change, focal weakness, seizures, loss of consciousness and headaches.  Psychiatric/Behavioral: Negative for depression. The patient is not nervous/anxious.     Dilation: 7.5 Effacement (%): 80 Station: -2 Exam by:: SBeck, RN Blood pressure 111/72, pulse 72, temperature 98.1 F (36.7 C), temperature source Oral, resp. rate 20, height 5\' 3"  (1.6 m), weight 72.576 kg (160 lb), last menstrual period 03/24/2011, SpO2 100.00%. Maternal Exam:  Uterine Assessment: Contraction strength is moderate.  Contraction duration is 1 minute. Contraction frequency is regular.   Abdomen: Patient reports no abdominal tenderness. Fundal height is Appropriate for gestational age.   Fetal presentation: vertex     Fetal Exam Fetal Monitor Review: Baseline rate: 130.  Variability: moderate (6-25 bpm).   Pattern: accelerations present and no decelerations.    Fetal State Assessment: Category I - tracings are normal.     Physical Exam  Constitutional: She is oriented to person, place, and time. She appears well-developed and well-nourished.  HENT:  Head: Normocephalic and atraumatic.  Eyes: Conjunctivae and EOM are normal. Right eye exhibits no discharge. Left eye exhibits no discharge. No scleral icterus.  Neck: No tracheal deviation present.  Cardiovascular: Normal rate, regular rhythm and normal heart sounds.   Respiratory: Breath sounds normal. No stridor.  GI: Soft. She exhibits no distension.  There is no rebound and no guarding.       Gravid  Genitourinary:        Deferred cervical exam as recently examined by RN.  Neurological: She is alert and oriented to person, place, and time.  Skin: Skin is warm and dry.  Psychiatric: She has a normal mood and affect. Her behavior is normal. Judgment and thought content normal.    Prenatal labs: ABO, Rh: A/POS/-- (02/22 1338) Antibody: NEG (02/22 1338) Rubella: 36.3 (02/22 1338) RPR: NON REACTIVE (06/16 0458)  HBsAg: NEGATIVE (02/22 1338)  HIV: NON REACTIVE (05/02 1153)  GBS: Negative (06/12 0000)   Assessment/Plan: Admit to L&D for expectant management. Dr. Elwyn Reach contacted, she would like to be contacted when patient is complete. Anticipated mode of delivery: vaginal.  CRANSTOUN, LANDI 12/10/2011, 8:45 AM

## 2011-12-10 NOTE — Progress Notes (Signed)
Pt back from walking, monitors applied and assessing, pt states is feeling "more pain' with uc's than before the walk.

## 2011-12-10 NOTE — H&P (Signed)
Reviewed with patient expectant management given preterm gestational age. If no cervical change over the next several hours we will discharge home with precuations. Reviewed with patient that there will be no augmentation or IOL at this gestational age given normal pregnancy and no risk factors. Pt upset but verbalizes understanding.   Piper Albro E. 10:46 AM

## 2011-12-10 NOTE — Progress Notes (Signed)
S: continues to have irregular contractions- very uncomfortable.  Contractions brought on by walking and activity.   Infant moving well.  Pt states that she is drinking lots of fluids.   O: see flowsheet  A and P: -Reviewed kick counts and labor precautions. -reassured patient that although contractions are painful- this is normal and healthy. - Discussed with patient the reasons that we do not induce or try to provoke labor at 37 weeks- not best for mother or baby in healthy pregnancies.  Discussed the importance of infant development that occurs during the weeks of 37-40 weeks.  Pt states understanding.  -return in 1 week for routine care.

## 2011-12-10 NOTE — Discharge Instructions (Signed)

## 2011-12-10 NOTE — MAU Note (Signed)
contractions 

## 2011-12-10 NOTE — Assessment & Plan Note (Signed)
See above

## 2011-12-10 NOTE — Progress Notes (Signed)
Saline locked, monitors off for 1 hour per [place],CNM orders, pt to go walking

## 2011-12-10 NOTE — Discharge Summary (Signed)
Obstetric Discharge Summary Reason for Admission: Concern for active labor Prenatal Procedures: none Discharge Diagnosis: Braxton Hicks contractions Hemoglobin  Date Value Range Status  12/10/2011 12.3  12.0 - 15.0 g/dL Final     HCT  Date Value Range Status  12/10/2011 35.8* 36.0 - 46.0 % Final    Physical Exam:  General: alert, cooperative, appears stated age and no distress Lochia: n/a Uterine Fundus: firm, appropriate for gestational age DVT Evaluation: No evidence of DVT seen on physical exam.  Discharge Diagnoses: False labor-undelivered  Discharge Information: Date: 12/10/2011 Activity: unrestricted Diet: routine Medications: None Condition: stable Instructions: refer to practice specific booklet Discharge to: home Follow-up Information    Please follow up. (Follow up at your previously scheduled prenatal apt.  Come to MAU as needed.)          Eltha Tingley 12/10/2011, 12:46 PM

## 2011-12-13 NOTE — Discharge Summary (Signed)
Agree with above note.  Shivaun Bilello 12/13/2011 1:20 PM   

## 2011-12-13 NOTE — H&P (Signed)
Agree with above note.  Colleen Daniels 12/13/2011 2:18 PM

## 2011-12-15 ENCOUNTER — Telehealth: Payer: Self-pay | Admitting: Emergency Medicine

## 2011-12-15 MED ORDER — FLUCONAZOLE 150 MG PO TABS: 150.0000 mg | ORAL_TABLET | Freq: Once | ORAL | Status: DC

## 2011-12-15 NOTE — Telephone Encounter (Signed)
Patient is calling to ask Dr. Elwyn Reach if she can prescribe something for a Yeast Infection.  She gets them every month.  She uses [facility] [address].

## 2011-12-16 ENCOUNTER — Encounter (HOSPITAL_COMMUNITY): Payer: Self-pay | Admitting: *Deleted

## 2011-12-16 ENCOUNTER — Ambulatory Visit (INDEPENDENT_AMBULATORY_CARE_PROVIDER_SITE_OTHER): Payer: Self-pay | Admitting: Family Medicine

## 2011-12-16 ENCOUNTER — Inpatient Hospital Stay (HOSPITAL_COMMUNITY)
Admission: AD | Admit: 2011-12-16 | Discharge: 2011-12-17 | DRG: 775 | Disposition: A | Discharge status: Home / Self Care | Payer: Medicaid Other | Source: Ambulatory Visit | Attending: Obstetrics & Gynecology | Admitting: Obstetrics & Gynecology

## 2011-12-16 VITALS — BP 107/72 | Wt 159.0 lb

## 2011-12-16 DIAGNOSIS — Z3480 Encounter for supervision of other normal pregnancy, unspecified trimester: Secondary | ICD-10-CM

## 2011-12-16 DIAGNOSIS — Z331 Pregnant state, incidental: Secondary | ICD-10-CM

## 2011-12-16 DIAGNOSIS — B379 Candidiasis, unspecified: Secondary | ICD-10-CM

## 2011-12-16 LAB — CBC
HCT: 38.3 % (ref 36.0–46.0)
RDW: 14.2 % (ref 11.5–15.5)
WBC: 12.8 10*3/uL — ABNORMAL HIGH (ref 4.0–10.5)

## 2011-12-16 LAB — RPR: RPR Ser Ql: NONREACTIVE

## 2011-12-16 LAB — TYPE AND SCREEN
ABO/RH(D): A POS
Antibody Screen: NEGATIVE

## 2011-12-16 LAB — POCT FERNING: Ferning, POC: NEGATIVE

## 2011-12-16 MED ORDER — PRENATAL MULTIVITAMIN CH
1.0000 | ORAL_TABLET | Freq: Every day | ORAL | Status: DC
  Administered 2011-12-16 – 2011-12-17 (×2): 1 via ORAL
  Filled 2011-12-16 (×2): qty 1

## 2011-12-16 MED ORDER — WITCH HAZEL-GLYCERIN EX PADS: 1.0000 "application " | MEDICATED_PAD | CUTANEOUS | Status: DC | PRN

## 2011-12-16 MED ORDER — OXYTOCIN BOLUS FROM INFUSION
250.0000 mL | Freq: Once | INTRAVENOUS | Status: DC
  Filled 2011-12-16: qty 500

## 2011-12-16 MED ORDER — SENNOSIDES-DOCUSATE SODIUM 8.6-50 MG PO TABS
2.0000 | ORAL_TABLET | Freq: Every day | ORAL | Status: DC
  Administered 2011-12-16: 2 via ORAL

## 2011-12-16 MED ORDER — IBUPROFEN 600 MG PO TABS
600.0000 mg | ORAL_TABLET | Freq: Four times a day (QID) | ORAL | Status: DC
  Administered 2011-12-16 – 2011-12-17 (×4): 600 mg via ORAL
  Filled 2011-12-16 (×4): qty 1

## 2011-12-16 MED ORDER — LIDOCAINE HCL (PF) 1 % IJ SOLN
30.0000 mL | INTRAMUSCULAR | Status: DC | PRN
  Filled 2011-12-16: qty 30

## 2011-12-16 MED ORDER — CITRIC ACID-SODIUM CITRATE 334-500 MG/5ML PO SOLN: 30.0000 mL | ORAL | Status: DC | PRN

## 2011-12-16 MED ORDER — OXYCODONE-ACETAMINOPHEN 5-325 MG PO TABS
1.0000 | ORAL_TABLET | ORAL | Status: DC | PRN
Start: 2011-12-16 — End: 2011-12-17
  Administered 2011-12-16 – 2011-12-17 (×2): 1 via ORAL
  Filled 2011-12-16 (×2): qty 1

## 2011-12-16 MED ORDER — DIPHENHYDRAMINE HCL 25 MG PO CAPS
25.0000 mg | ORAL_CAPSULE | Freq: Four times a day (QID) | ORAL | Status: DC | PRN
Start: 2011-12-16 — End: 2011-12-17

## 2011-12-16 MED ORDER — BUTORPHANOL TARTRATE 2 MG/ML IJ SOLN: 1.0000 mg | INTRAMUSCULAR | Status: DC | PRN

## 2011-12-16 MED ORDER — PRENATAL MULTIVITAMIN CH: 1.0000 | ORAL_TABLET | Freq: Every day | ORAL | Status: DC

## 2011-12-16 MED ORDER — IBUPROFEN 600 MG PO TABS
600.0000 mg | ORAL_TABLET | Freq: Four times a day (QID) | ORAL | Status: DC | PRN
  Administered 2011-12-16: 600 mg via ORAL
  Filled 2011-12-16: qty 1

## 2011-12-16 MED ORDER — OXYCODONE-ACETAMINOPHEN 5-325 MG PO TABS
1.0000 | ORAL_TABLET | ORAL | Status: DC | PRN
  Administered 2011-12-16: 1 via ORAL
  Filled 2011-12-16: qty 1

## 2011-12-16 MED ORDER — FLEET ENEMA 7-19 GM/118ML RE ENEM: 1.0000 | ENEMA | RECTAL | Status: DC | PRN

## 2011-12-16 MED ORDER — ZOLPIDEM TARTRATE 5 MG PO TABS: 5.0000 mg | ORAL_TABLET | Freq: Every evening | ORAL | Status: DC | PRN

## 2011-12-16 MED ORDER — ONDANSETRON HCL 4 MG PO TABS: 4.0000 mg | ORAL_TABLET | ORAL | Status: DC | PRN

## 2011-12-16 MED ORDER — ONDANSETRON HCL 4 MG/2ML IJ SOLN: 4.0000 mg | INTRAMUSCULAR | Status: DC | PRN

## 2011-12-16 MED ORDER — BENZOCAINE-MENTHOL 20-0.5 % EX AERO
1.0000 "application " | INHALATION_SPRAY | CUTANEOUS | Status: DC | PRN
  Administered 2011-12-16: 1 via TOPICAL
  Filled 2011-12-16: qty 56

## 2011-12-16 MED ORDER — LANOLIN HYDROUS EX OINT: TOPICAL_OINTMENT | CUTANEOUS | Status: DC | PRN

## 2011-12-16 MED ORDER — OXYTOCIN 40 UNITS IN LACTATED RINGERS INFUSION - SIMPLE MED
62.5000 mL/h | Freq: Once | INTRAVENOUS | Status: AC
  Administered 2011-12-16: 999 mL/h via INTRAVENOUS
  Filled 2011-12-16: qty 1000

## 2011-12-16 MED ORDER — ACETAMINOPHEN 325 MG PO TABS: 650.0000 mg | ORAL_TABLET | ORAL | Status: DC | PRN

## 2011-12-16 MED ORDER — ONDANSETRON HCL 4 MG/2ML IJ SOLN: 4.0000 mg | Freq: Four times a day (QID) | INTRAMUSCULAR | Status: DC | PRN

## 2011-12-16 MED ORDER — LACTATED RINGERS IV SOLN: INTRAVENOUS | Status: DC

## 2011-12-16 MED ORDER — DIBUCAINE 1 % RE OINT: 1.0000 "application " | TOPICAL_OINTMENT | RECTAL | Status: DC | PRN

## 2011-12-16 MED ORDER — LACTATED RINGERS IV SOLN: 500.0000 mL | INTRAVENOUS | Status: DC | PRN

## 2011-12-16 MED ORDER — SIMETHICONE 80 MG PO CHEW: 80.0000 mg | CHEWABLE_TABLET | ORAL | Status: DC | PRN

## 2011-12-16 MED ORDER — NALBUPHINE SYRINGE 5 MG/0.5 ML
5.0000 mg | INJECTION | INTRAMUSCULAR | Status: DC | PRN
  Administered 2011-12-16: 5 mg via INTRAVENOUS
  Filled 2011-12-16: qty 0.5

## 2011-12-16 NOTE — Assessment & Plan Note (Signed)
Pt to [facility]U 

## 2011-12-16 NOTE — Progress Notes (Signed)
Colleen Daniels is a 21 y.o. G2P1001 at [redacted]w[redacted]d admitted for active labor  Subjective: Contractions strong.  Nubain not helping.  Feeling leaking.  Objective: BP 106/64  Pulse 89  Temp 97.8 F (36.6 C) (Oral)  Resp 20  Ht 5\' 3"  (1.6 m)  Wt 159 lb (72.122 kg)  BMI 28.17 kg/m2  LMP 03/24/2011      FHT:  FHR: 140 bpm, variability: moderate,  accelerations:  Present,  decelerations:  Absent UC:   regular, every 2-3 minutes SVE:   Dilation: 8 Effacement (%): 100 Station: 0 Exam by:: Dr. Elwyn Reach  Labs: Lab Results  Component Value Date   WBC 12.8* 12/16/2011   HGB 13.0 12/16/2011   HCT 38.3 12/16/2011   MCV 85.1 12/16/2011   PLT 209 12/16/2011    Assessment / Plan: Spontaneous labor, progressing normally  Labor: Progressing normally and SROM at 1220 Fetal Wellbeing:  Category I Pain Control:  Labor support without medications and Nubain as needed I/D:  n/a Anticipated MOD:  NSVD  Colleen Daniels, Colleen Daniels 12/16/2011, 12:21 PM
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
## 2011-12-16 NOTE — Progress Notes (Signed)
S: + contractions frequent- occuring every 3-5 min, very painful.  Also having some liquid from vagina that soaked panty liner.   Concerned that rupture of membranes may have occurred.  Very uncomfortable and not able to talk through contractions  O: contractions frequent- every 2-4 min, patient unable to talk through.  See exam above.  On sterile speculum exam: + Nitrazine test, No ferning, no pooling.   A and P: Pt in active labor with cervical change- now dilated to 6-7 cm.  Nitrazine swab + but patient had sex recently.   No pooling or ferning- No ROM currently.  Pt to go to MAU for admission to hospital.  Dr. Elwyn Reach notified that pt sent to hospital for delivery. Husband states he will take patient directly to MAU.

## 2011-12-17 LAB — CBC
MCH: 28.4 pg (ref 26.0–34.0)
MCHC: 32.9 g/dL (ref 30.0–36.0)
Platelets: 202 10*3/uL (ref 150–400)
RBC: 3.48 MIL/uL — ABNORMAL LOW (ref 3.87–5.11)
RDW: 14.5 % (ref 11.5–15.5)

## 2011-12-17 MED ORDER — IBUPROFEN 600 MG PO TABS: 600.0000 mg | ORAL_TABLET | Freq: Four times a day (QID) | ORAL | Status: AC

## 2011-12-17 MED ORDER — AMBULATORY NON FORMULARY MEDICATION: 1.0000 "application " | Status: DC | PRN

## 2011-12-17 MED ORDER — FERROUS SULFATE 325 (65 FE) MG PO TABS
325.0000 mg | ORAL_TABLET | Freq: Two times a day (BID) | ORAL | Status: DC
  Administered 2011-12-17: 325 mg via ORAL
  Filled 2011-12-17: qty 1

## 2011-12-17 NOTE — Progress Notes (Signed)
I examined pt and agree with documentation above and resident plan of care.  Discussed possible BTL with financial counselor Burman Foster, will go talk to patient about options. [facility]

## 2011-12-17 NOTE — Discharge Summary (Signed)
Obstetric Discharge Summary Reason for Admission: onset of labor Prenatal Procedures: none Intrapartum Procedures: spontaneous vaginal delivery Postpartum Procedures: none Complications-Operative and Postpartum: 1st degree perineal laceration; no repair required Hemoglobin  Date Value Range Status  12/17/2011 9.9* 12.0 - 15.0 g/dL Final     DELTA CHECK NOTED     REPEATED TO VERIFY     HCT  Date Value Range Status  12/17/2011 30.1* 36.0 - 46.0 % Final    Physical Exam:  General: alert, cooperative and appears stated age Lochia: appropriate Uterine Fundus: firm Incision: healing well, no significant drainage, no dehiscence DVT Evaluation: No evidence of DVT seen on physical exam. Negative Homan's sign. No cords or calf tenderness.  Discharge Diagnoses: Term Pregnancy-delivered  Discharge Information: Date: 12/17/2011 Activity: pelvic rest Diet: routine Medications: Ibuprofen and All purpose nipple cream Condition: stable Instructions: refer to practice specific booklet Discharge to: home   Newborn Data: Live born female  Birth Weight: 6 lb 5 oz (2863 g) APGAR: 9, 9  Home with mother.  Pt plans for [facility] Breast Feeding - significant nipple soreness; all purpose nipple cream rx  Andrena Mews, DO [facility] Resident - PGY-1 12/17/2011 5:29 PM

## 2011-12-17 NOTE — Progress Notes (Signed)
UR chart review completed.  

## 2011-12-17 NOTE — Progress Notes (Signed)
Post Partum Day 1 Subjective: no complaints, up ad lib, voiding, tolerating PO and + flatus, moderate lochia, absent BM, present flatus, plans to breastfeed, plans to bottle feed, would like tubal (only has orange card)  Objective: Blood pressure 93/52, pulse 77, temperature 98.4 F (36.9 C), temperature source Oral, resp. rate 16, height 5\' 3"  (1.6 m), weight 159 lb (72.122 kg), last menstrual period 03/24/2011, SpO2 99.00%, unknown if currently breastfeeding.  Physical Exam:  General: alert, cooperative, appears stated age and no distress Lochia: appropriate Chest: CTAB Heart: RRR no m/r/g Abdomen: +BS, soft, nontender,  Uterine Fundus: firm DVT Evaluation: No evidence of DVT seen on physical exam. Negative Homan's sign. No cords or calf tenderness. No significant calf/ankle edema. Extremities:    Basename 12/17/11 0540 12/16/11 1140  HGB 9.9* 13.0  HCT 30.1* 38.3    Assessment/Plan: Breastfeeding, Lactation consult and Contraception will have OB faculty discuss BTL with her Will call this afternoon, and discharge if baby going home   LOS: 1 day   BOOTH, Katina Remick 12/17/2011, 6:49 AM

## 2011-12-24 NOTE — Discharge Summary (Signed)
I have seen the patient and examination done.  I agree with documentation and plan as noted in resident/PA's note. Colleen Daniels V 12/24/2011 7:48 AM

## 2012-02-07 ENCOUNTER — Ambulatory Visit: Payer: Self-pay | Admitting: Emergency Medicine

## 2012-02-10 ENCOUNTER — Encounter: Payer: Self-pay | Admitting: Emergency Medicine

## 2012-02-10 ENCOUNTER — Ambulatory Visit (INDEPENDENT_AMBULATORY_CARE_PROVIDER_SITE_OTHER): Payer: Self-pay | Admitting: Emergency Medicine

## 2012-02-10 ENCOUNTER — Other Ambulatory Visit: Payer: Self-pay | Admitting: Emergency Medicine

## 2012-02-10 VITALS — BP 105/73 | HR 63 | Ht 63.0 in | Wt 153.0 lb

## 2012-02-10 DIAGNOSIS — R102 Pelvic and perineal pain unspecified side: Secondary | ICD-10-CM | POA: Insufficient documentation

## 2012-02-10 DIAGNOSIS — N949 Unspecified condition associated with female genital organs and menstrual cycle: Secondary | ICD-10-CM

## 2012-02-10 NOTE — Assessment & Plan Note (Signed)
Right side.  Likely cyst given her age; however pain pattern not as consistent with this.  Will get pelvic ultrasound to evaluate uterus and adnexa.

## 2012-02-10 NOTE — Patient Instructions (Signed)
It was nice to see you!  You have a wonderful family!  I would recommend you go to the health department to get the Nexplanon.  Make sure you use condoms until the nexplanon is placed.  For your ovary pain, we are going to get an ultrasound.  I would like you to follow up with me in 2-4 weeks to go over results of the ultrasounds and reassess the pain.  In the meantime, you can take tylenol as needed for the pain.

## 2012-02-10 NOTE — Assessment & Plan Note (Signed)
Doing well.  No vaginal bleeding, br/bo feeding, referred to HD for nexplanon, good mood.  Has had some smelly discharge - if still present at follow up in 2-3 weeks with obtain wet prep.  Has started menstruating again, encouraged condom use until nexplanon placed.

## 2012-02-10 NOTE — Progress Notes (Signed)
  Subjective:    Patient ID: Colleen Daniels, female    DOB: 01-16-1991, 21 y.o.   MRN: [id number]  HPI Colleen Daniels is here for PP follow up.  Doing well following uncomplicated vaginal delivery.  Had a period last week that was normal, no other vaginal bleeding.  Br/Bo feeding and planning on nexplanon for birth control.  Is complaining of some right "ovary" pain that comes and goes; sharp, 2-3x/day, worse with lifting, tylenol helps.  Has similar but worse pain prior to pregnancy. No urinary symptoms.  Does have a small amount of smelly vaginal discharge but to itching.  I have reviewed and updated the following as appropriate: allergies, current medications and problem list  SHx: never smoker    Review of Systems See HPI    Objective:   Physical Exam BP 105/73  Pulse 63  Ht 5\' 3"  (1.6 m)  Wt 153 lb (69.4 kg)  BMI 27.10 kg/m2 Gen: alert, cooperative, NAD HEENT: AT/[place], sclera white, MMM CV: RRR, no murmurs Pulm: CTAB Abd: +BS, soft, diffusely tender but worse in RLQ, no rebound or guarding Pelvic: normal uterine size, no adnexal fullness, but right adnexal tenderness, no CMT      Assessment & Plan:

## 2012-02-15 ENCOUNTER — Ambulatory Visit (HOSPITAL_COMMUNITY)
Admission: RE | Admit: 2012-02-15 | Discharge: 2012-02-15 | Disposition: A | Discharge status: Home / Self Care | Payer: Self-pay | Source: Ambulatory Visit | Attending: Family Medicine | Admitting: Family Medicine

## 2012-02-15 DIAGNOSIS — R1031 Right lower quadrant pain: Secondary | ICD-10-CM | POA: Insufficient documentation

## 2012-02-15 DIAGNOSIS — N949 Unspecified condition associated with female genital organs and menstrual cycle: Secondary | ICD-10-CM | POA: Insufficient documentation

## 2012-02-15 DIAGNOSIS — R102 Pelvic and perineal pain: Secondary | ICD-10-CM

## 2012-02-16 ENCOUNTER — Telehealth: Payer: Self-pay | Admitting: Emergency Medicine

## 2012-02-16 MED ORDER — FLUCONAZOLE 150 MG PO TABS: 150.0000 mg | ORAL_TABLET | Freq: Once | ORAL | Status: AC

## 2012-02-16 NOTE — Telephone Encounter (Signed)
Patient returned call.  Continues to have ovary pain.  No association that she has noticed with breast feeding.  Reviewed normal ultrasound results with patient.  She does report her typical yeast infection discharge.  Will prescribe diflucan 150mg  x1.  Also discussed using Motrin for the pain.  She will follow up in 2-4 weeks if no improvement in her pain.  She also mentioned that the health department is no longer doing the Nexplanon.  She has called [facility] and is planning on having it done there.  They were asking about pap smear - last pap was in 08/2008 and was normal.  Discussed that she is due for a pap smear.  I am happy to do it here with her follow up or PP can collected the specimen.

## 2012-02-16 NOTE — Telephone Encounter (Signed)
Called and left voicemail for patient to call clinic to discuss results of ultrasound.

## 2012-03-28 ENCOUNTER — Telehealth: Payer: Self-pay | Admitting: Emergency Medicine

## 2012-03-28 NOTE — Telephone Encounter (Signed)
Patient reports she has had  rectal bleeding before, when she was  Constipated. Now for last 3 days has  Bleeding with BM . Large amount of blood in toilet by patient's reports.  Only occurs when she has BM once or twice daily. . Advised that she will ned appointment to be evaluated.  Checked with [employer]. She has no insurance and advised she will need to pay $20.00 at check in. Then she can be given financial aid information. She will call back for appointment when she arranges transportation.

## 2012-03-28 NOTE — Telephone Encounter (Signed)
Please call Colleen Daniels back asap.  Concerned about rectal bleeding she's been having for 3 days.

## 2012-03-28 NOTE — Telephone Encounter (Signed)
Appointment has been scheduled for tomorrow.

## 2012-03-29 ENCOUNTER — Ambulatory Visit (INDEPENDENT_AMBULATORY_CARE_PROVIDER_SITE_OTHER): Payer: Self-pay | Admitting: Family Medicine

## 2012-03-29 VITALS — BP 97/68 | HR 87 | Temp 98.0°F | Ht 63.0 in | Wt 150.0 lb

## 2012-03-29 DIAGNOSIS — K921 Melena: Secondary | ICD-10-CM

## 2012-03-29 NOTE — Assessment & Plan Note (Signed)
Feel this is most likely due to internal hemorrhoids given recent pregnancy and otherwise feeling well.  Orthostatics and POC Hg normal.  Reassured patient, this will likely self resolve, she should follow up if not improved in 2 weeks.

## 2012-03-29 NOTE — Patient Instructions (Signed)

## 2012-03-29 NOTE — Progress Notes (Signed)
  Subjective:    Patient ID: Colleen Daniels, female    DOB: January 07, 1991, 21 y.o.   MRN: [id number]  HPI  Patient comes in with complaint of blood in stool x 5 days.  She says she has about one stool a day, but says that there is blood mixed in with the stool, and she saw some blood clots.  She has otherwise felt fine, no nausea/vomiting, abdominal pain, light headedness. She had external hemorrhoids with her recent pregnancy, but dose not think she has any currently.   Review of Systems See HPI    Objective:   Physical Exam BP 97/68  Pulse 87  Temp 98 F (36.7 C) (Oral)  Ht 5\' 3"  (1.6 m)  Wt 150 lb (68.04 kg)  BMI 26.57 kg/m2  LMP 03/22/2012 Orthostatics:  Lying: 110/73 Sitting: 96/68 Standing 97/68 General appearance: alert, cooperative and no distress Rectal Exam: Normal rectal tone, no external hemorrhoids, no rectal fissures, heme occult negative.       Assessment & Plan:

## 2012-03-31 ENCOUNTER — Telehealth: Payer: Self-pay | Admitting: Emergency Medicine

## 2012-03-31 NOTE — Telephone Encounter (Signed)
Pt is having pain in lower abdomon and wants to know what to do

## 2012-03-31 NOTE — Telephone Encounter (Signed)
Message left to return call.

## 2012-03-31 NOTE — Telephone Encounter (Signed)
Spoke with patient . Has had abdominal pain for a while she states but not has worsened. She reports pain is severe. Mid to right lower abdomen. Has to hold herself when walking , not really doubling over.. No fever. Reports loose stool once daily. She will not be able to come to clinic today before 4:00 so advised her to go to Urgent Care to be seen today. She voices understanding.

## 2012-04-11 ENCOUNTER — Telehealth: Payer: Self-pay | Admitting: Emergency Medicine

## 2012-04-11 NOTE — Telephone Encounter (Signed)
Called pt. Recommended to have OV with Dr.Booth to discuss. Pt declined, does not have insurance and she received a high bill from [place] already. I advised the pt to call a UC (would probably be cheaper) to be evaluated and also try to apply for 'orange card' again (her card expired) pt agreed. Fwd. To PCP for info. Colleen Daniels, [name]

## 2012-04-11 NOTE — Telephone Encounter (Signed)
Is having abd pain again and would like to speak with Dr Elwyn Reach - she was told to call her when this happened again

## 2012-12-28 ENCOUNTER — Encounter (HOSPITAL_COMMUNITY): Payer: Self-pay | Admitting: Emergency Medicine

## 2012-12-28 ENCOUNTER — Emergency Department (HOSPITAL_COMMUNITY)
Admission: EM | Admit: 2012-12-28 | Discharge: 2012-12-28 | Disposition: A | Discharge status: Home / Self Care | Payer: Self-pay | Attending: Emergency Medicine | Admitting: Emergency Medicine

## 2012-12-28 DIAGNOSIS — L299 Pruritus, unspecified: Secondary | ICD-10-CM | POA: Insufficient documentation

## 2012-12-28 DIAGNOSIS — R059 Cough, unspecified: Secondary | ICD-10-CM | POA: Insufficient documentation

## 2012-12-28 DIAGNOSIS — L089 Local infection of the skin and subcutaneous tissue, unspecified: Secondary | ICD-10-CM | POA: Insufficient documentation

## 2012-12-28 DIAGNOSIS — R05 Cough: Secondary | ICD-10-CM | POA: Insufficient documentation

## 2012-12-28 DIAGNOSIS — Z8619 Personal history of other infectious and parasitic diseases: Secondary | ICD-10-CM | POA: Insufficient documentation

## 2012-12-28 MED ORDER — IBUPROFEN 400 MG PO TABS: 400.0000 mg | ORAL_TABLET | Freq: Four times a day (QID) | ORAL | Status: DC | PRN

## 2012-12-28 MED ORDER — DOXYCYCLINE HYCLATE 100 MG PO CAPS: 100.0000 mg | ORAL_CAPSULE | Freq: Two times a day (BID) | ORAL | Status: DC

## 2012-12-28 MED ORDER — IBUPROFEN 400 MG PO TABS
800.0000 mg | ORAL_TABLET | Freq: Once | ORAL | Status: AC
  Administered 2012-12-28: 800 mg via ORAL
  Filled 2012-12-28: qty 2

## 2012-12-28 NOTE — ED Notes (Signed)
PT. REPORTS SMALL LUMP BEHIND RIGHT EAR WITH PAIN ONSET Monday , DENIES INJURY / NO DRAINAGE OR FEVER.

## 2012-12-28 NOTE — ED Notes (Signed)
Pt c/o knot behind right ear, sts she first noticed it on Monday and it is painful to touch. No drainage from site. Pt denies pain to ear. Pt denies having abscess before. Pt in nad, skin warm and dry, resp e/u.

## 2012-12-28 NOTE — ED Provider Notes (Signed)
History    This chart was scribed for Fayrene Helper, non-physician practitioner working with Juliet Rude. Rubin Payor, MD by Leone Payor, ED Scribe. This patient was seen in room [facility] and the patient's care was started at 2022.  CSN: [id number] Arrival date & time 12/28/12  2022  First MD Initiated Contact with Patient 12/28/12 2139     Chief Complaint  Patient presents with  . Otalgia    The history is provided by the patient. No language interpreter was used.    HPI Comments: Colleen Daniels is a 22 y.o. female who presents to the Emergency Department complaining of a constant, gradually growing bump behind the R ear. She first noticed this 4 days ago. She denies any recent injury or trauma. States pain is worse with skin movement. States she has some associated mild itching. Per pt, she has been noticing bumps to her scalp as well. She denies any recent change in shampoo. She has taken ibuprofen with minimal relief.    Past Medical History  Diagnosis Date  . No pertinent past medical history   . Hepatitis B    Past Surgical History  Procedure Laterality Date  . No past surgeries     Family History  Problem Relation Age of Onset  . Diabetes Maternal Grandmother   . Hypertension Maternal Grandmother    History  Substance Use Topics  . Smoking status: Never Smoker   . Smokeless tobacco: Never Used  . Alcohol Use: No   OB History   Grav Para Term Preterm Abortions TAB SAB Ect Mult Living   2 2 2  0 0 0 0 0 0 2     Review of Systems  Constitutional: Negative for fever.  HENT: Negative for ear pain and sneezing.        Bump behind the ear  Respiratory: Positive for cough. Negative for shortness of breath.   Cardiovascular: Negative for chest pain.  All other systems reviewed and are negative.    Allergies  Review of patient's allergies indicates no known allergies.  Home Medications  No current outpatient prescriptions on file. BP 121/72  Pulse 76   Temp(Src) 97.9 F (36.6 C) (Oral)  Resp 18  SpO2 99%  LMP 12/07/2012 Physical Exam  Nursing note and vitals reviewed. Constitutional: She is oriented to person, place, and time. She appears well-developed and well-nourished.  Generally healthy, non toxic appearing female.   HENT:  Head: Normocephalic and atraumatic.  Several pea size nodule skin changes to her R side of scalp, approximately 4 nodules were noted. No overlying skin changes, no erythema or alopecia. Also has a shotty R post-auricular lymphadenopathy with marked tenderness to palpation but no edema.  Eyes: Conjunctivae and EOM are normal. Pupils are equal, round, and reactive to light.  Neck: Normal range of motion. Neck supple.  Cardiovascular: Normal rate, regular rhythm and normal heart sounds.   Pulmonary/Chest: Effort normal and breath sounds normal.  Abdominal: Soft. Bowel sounds are normal.  Musculoskeletal: Normal range of motion.  Neurological: She is alert and oriented to person, place, and time.  Skin: Skin is warm and dry.  Psychiatric: She has a normal mood and affect.    ED Course  Procedures (including critical care time)  DIAGNOSTIC STUDIES: Oxygen Saturation is 99% on RA, normal by my interpretation.    COORDINATION OF CARE: 10:33 PM Discussed treatment plan with pt at bedside and pt agreed to plan. Likely infection causing lymphadenopathy.  Will d/c with doxy and  ibuprofen.  Pt recommend return in 2-3 days if no ipmrovement.   Labs Reviewed - No data to display No results found. 1. Superficial skin infection     MDM  BP 121/72  Pulse 76  Temp(Src) 97.9 F (36.6 C) (Oral)  Resp 18  SpO2 99%  LMP 12/07/2012   I personally performed the services described in this documentation, which was scribed in my presence. The recorded information has been reviewed and is accurate.    Fayrene Helper, PA-C 12/28/12 2311

## 2012-12-30 NOTE — ED Provider Notes (Signed)
Medical screening examination/treatment/procedure(s) were performed by non-physician practitioner and as supervising physician I was immediately available for consultation/collaboration.  Detta Mellin R. Ashonte Angelucci, MD 12/30/12 1400 

## 2013-03-26 ENCOUNTER — Emergency Department (HOSPITAL_COMMUNITY): Payer: Medicaid Other

## 2013-03-26 ENCOUNTER — Encounter (HOSPITAL_COMMUNITY): Payer: Self-pay | Admitting: *Deleted

## 2013-03-26 ENCOUNTER — Emergency Department (HOSPITAL_COMMUNITY)
Admission: EM | Admit: 2013-03-26 | Discharge: 2013-03-26 | Disposition: A | Discharge status: Home / Self Care | Payer: Self-pay | Attending: Emergency Medicine | Admitting: Emergency Medicine

## 2013-03-26 DIAGNOSIS — Z8619 Personal history of other infectious and parasitic diseases: Secondary | ICD-10-CM | POA: Insufficient documentation

## 2013-03-26 DIAGNOSIS — Z3202 Encounter for pregnancy test, result negative: Secondary | ICD-10-CM | POA: Insufficient documentation

## 2013-03-26 DIAGNOSIS — M62838 Other muscle spasm: Secondary | ICD-10-CM | POA: Insufficient documentation

## 2013-03-26 DIAGNOSIS — R079 Chest pain, unspecified: Secondary | ICD-10-CM | POA: Insufficient documentation

## 2013-03-26 LAB — CBC
Hemoglobin: 13.3 g/dL (ref 12.0–15.0)
MCH: 28.4 pg (ref 26.0–34.0)
MCV: 81.9 fL (ref 78.0–100.0)
RBC: 4.69 MIL/uL (ref 3.87–5.11)

## 2013-03-26 LAB — BASIC METABOLIC PANEL
CO2: 24 mEq/L (ref 19–32)
Glucose, Bld: 83 mg/dL (ref 70–99)
Potassium: 4 mEq/L (ref 3.5–5.1)
Sodium: 137 mEq/L (ref 135–145)

## 2013-03-26 MED ORDER — IBUPROFEN 800 MG PO TABS
800.0000 mg | ORAL_TABLET | Freq: Once | ORAL | Status: AC
  Administered 2013-03-26: 800 mg via ORAL
  Filled 2013-03-26: qty 1

## 2013-03-26 MED ORDER — DIAZEPAM 5 MG PO TABS
5.0000 mg | ORAL_TABLET | Freq: Once | ORAL | Status: AC
  Administered 2013-03-26: 5 mg via ORAL
  Filled 2013-03-26: qty 1

## 2013-03-26 MED ORDER — IBUPROFEN 800 MG PO TABS: 800.0000 mg | ORAL_TABLET | Freq: Three times a day (TID) | ORAL | Status: DC | PRN

## 2013-03-26 MED ORDER — DIAZEPAM 5 MG PO TABS: 5.0000 mg | ORAL_TABLET | Freq: Three times a day (TID) | ORAL | Status: DC | PRN

## 2013-03-26 NOTE — ED Notes (Signed)
C/O pain in her left shoulder that goes to her left chest and down her left arm.  States that her left arm stays numb from shoulder to hand.  Denies dyspnea, diaphoresis but did vomit at 1600.  She also C/O nausea that waxes and wanes.  Symptoms began 2 weeks ago with back pain that went from her back to her neck and the back of her head.

## 2013-03-26 NOTE — ED Provider Notes (Signed)
CSN: [id number]     Arrival date & time 03/26/13  1650 History   First MD Initiated Contact with Patient 03/26/13 2022     Chief Complaint  Patient presents with  . Chest Pain   (Consider location/radiation/quality/duration/timing/severity/associated sxs/prior Treatment) Patient is a 22 y.o. female presenting with chest pain. The history is provided by the patient.  Chest Pain Pain location:  L chest Pain quality: sharp   Pain radiates to:  L arm Pain radiates to the back: no   Pain severity:  Moderate Onset quality:  Sudden Duration:  20 minutes Timing:  Sporadic (one episode) Progression:  Resolved Chronicity:  New Context: at rest   Context: not breathing, not eating, no intercourse, not lifting, not raising an arm and no trauma   Relieved by:  Nothing Worsened by:  Nothing tried Ineffective treatments:  None tried Associated symptoms: no abdominal pain, no anorexia, no back pain, no cough, no diaphoresis, no fatigue, no fever, no lower extremity edema, no nausea, no shortness of breath and not vomiting     Past Medical History  Diagnosis Date  . No pertinent past medical history   . Hepatitis B    Past Surgical History  Procedure Laterality Date  . No past surgeries     Family History  Problem Relation Age of Onset  . Diabetes Maternal Grandmother   . Hypertension Maternal Grandmother    History  Substance Use Topics  . Smoking status: Never Smoker   . Smokeless tobacco: Never Used  . Alcohol Use: No   OB History   Grav Para Term Preterm Abortions TAB SAB Ect Mult Living   2 2 2  0 0 0 0 0 0 2     Review of Systems  Constitutional: Negative for fever, diaphoresis and fatigue.  Respiratory: Negative for cough and shortness of breath.   Cardiovascular: Positive for chest pain.  Gastrointestinal: Negative for nausea, vomiting, abdominal pain and anorexia.  Musculoskeletal: Negative for back pain.  All other systems reviewed and are negative.    Allergies   Review of patient's allergies indicates no known allergies.  Home Medications   Current Outpatient Rx  Name  Route  Sig  Dispense  Refill  . Etonogestrel (IMPLANON [place])   Subcutaneous   Inject 1 Units into the skin once.         Marland Kitchen ibuprofen (ADVIL,MOTRIN) 200 MG tablet   Oral   Take 400 mg by mouth daily as needed for headache.         . diazepam (VALIUM) 5 MG tablet   Oral   Take 1 tablet (5 mg total) by mouth every 8 (eight) hours as needed for anxiety.   30 tablet   0   . ibuprofen (ADVIL,MOTRIN) 800 MG tablet   Oral   Take 1 tablet (800 mg total) by mouth every 8 (eight) hours as needed for pain.   30 tablet   0    BP 118/71  Pulse 59  Temp(Src) 98 F (36.7 C) (Oral)  Resp 15  Wt 153 lb (69.4 kg)  BMI 27.11 kg/m2  SpO2 99%  LMP 03/19/2013 Physical Exam  Nursing note and vitals reviewed. Constitutional: She is oriented to person, place, and time. She appears well-developed and well-nourished. No distress.  HENT:  Head: Normocephalic and atraumatic.  Eyes: EOM are normal. Pupils are equal, round, and reactive to light.  Neck: Normal range of motion. Neck supple.  Cardiovascular: Normal rate and regular rhythm.  Exam reveals  no friction rub.   No murmur heard. Pulmonary/Chest: Effort normal and breath sounds normal. No respiratory distress. She has no wheezes. She has no rales. She exhibits tenderness (L upper chest wall).  Abdominal: Soft. She exhibits no distension. There is no tenderness. There is no rebound.  Musculoskeletal: Normal range of motion. She exhibits no edema.       Cervical back: She exhibits tenderness (L sided trapezius through entire distribution). She exhibits no bony tenderness, no edema, no deformity and no laceration.  Neurological: She is alert and oriented to person, place, and time.  Skin: She is not diaphoretic.    ED Course  Procedures (including critical care time) Labs Review Labs Reviewed  BASIC METABOLIC PANEL  CBC   POCT I-STAT TROPONIN I  POCT PREGNANCY, URINE   Imaging Review Dg Chest 2 View  03/26/2013   CLINICAL DATA:  Left sided chest pain and left arm numbness  EXAM: CHEST  2 VIEW  COMPARISON:  None.  FINDINGS: The heart size and mediastinal contours are within normal limits. Both lungs are clear. The visualized skeletal structures are unremarkable.  IMPRESSION: No active cardiopulmonary disease.   Electronically Signed   By: Esperanza Heir M.D.   On: 03/26/2013 21:21    Date: 03/26/2013  Rate: 74  Rhythm: normal sinus rhythm  QRS Axis: normal  Intervals: normal  ST/T Wave abnormalities: normal  Conduction Disutrbances:none  Narrative Interpretation:   Old EKG Reviewed: none available    MDM   1. Chest pain   2. Muscle spasm    57F here with chest pain. Has been having L neck, L posterior shoulder pains for past 2 weeks. Worse with movement, activities. Today had episode of chest pain that was sharp in L chest with radiation into L arm with associated numbness and tingling. Pain lasted 20 minutes. No prior pain similar to this. No SOB, hemoptysis.  Here, AFVSS, relaxing comfortably. Lungs clear. Chest pain reproducible to palpation. Patient has palpable tenderness in L upper trapezius. L arm with normal strength and sensation. Normal troponin, normal CXR, normal other labs. Patient likely has muscle spasms, musculoskeletal pain. I am not concerned about ACS. History not consistent with PE. Given motrin, valium with relief in symptoms, given Rx for both to go home.     Dagmar Hait, MD 03/27/13 [phone]

## 2013-03-26 NOTE — ED Notes (Signed)
Pt has left chest /shoulder/neck pain that started today and states has pain when lifting arm.  Pt complains of numbness to arm.  No grip weakness or drifts

## 2013-06-01 ENCOUNTER — Encounter: Payer: Medicaid Other | Admitting: Family Medicine

## 2013-12-10 ENCOUNTER — Emergency Department (HOSPITAL_COMMUNITY)
Admission: EM | Admit: 2013-12-10 | Discharge: 2013-12-10 | Disposition: A | Discharge status: Home / Self Care | Payer: Medicaid Other | Attending: Emergency Medicine | Admitting: Emergency Medicine

## 2013-12-10 ENCOUNTER — Encounter (HOSPITAL_COMMUNITY): Payer: Self-pay | Admitting: Emergency Medicine

## 2013-12-10 ENCOUNTER — Emergency Department (HOSPITAL_COMMUNITY): Payer: Medicaid Other

## 2013-12-10 DIAGNOSIS — Z79899 Other long term (current) drug therapy: Secondary | ICD-10-CM | POA: Insufficient documentation

## 2013-12-10 DIAGNOSIS — Z8619 Personal history of other infectious and parasitic diseases: Secondary | ICD-10-CM | POA: Insufficient documentation

## 2013-12-10 DIAGNOSIS — Z3202 Encounter for pregnancy test, result negative: Secondary | ICD-10-CM | POA: Insufficient documentation

## 2013-12-10 DIAGNOSIS — K5901 Slow transit constipation: Secondary | ICD-10-CM | POA: Insufficient documentation

## 2013-12-10 LAB — BASIC METABOLIC PANEL
BUN: 12 mg/dL (ref 6–23)
CALCIUM: 8.6 mg/dL (ref 8.4–10.5)
CHLORIDE: 105 meq/L (ref 96–112)
CO2: 23 mEq/L (ref 19–32)
CREATININE: 0.61 mg/dL (ref 0.50–1.10)
Glucose, Bld: 88 mg/dL (ref 70–99)
Potassium: 4.2 mEq/L (ref 3.7–5.3)
Sodium: 138 mEq/L (ref 137–147)

## 2013-12-10 LAB — URINALYSIS, ROUTINE W REFLEX MICROSCOPIC
BILIRUBIN URINE: NEGATIVE
Glucose, UA: NEGATIVE mg/dL
HGB URINE DIPSTICK: NEGATIVE
KETONES UR: NEGATIVE mg/dL
Leukocytes, UA: NEGATIVE
Nitrite: NEGATIVE
Protein, ur: NEGATIVE mg/dL
SPECIFIC GRAVITY, URINE: 1.031 — AB (ref 1.005–1.030)
UROBILINOGEN UA: 1 mg/dL (ref 0.0–1.0)
pH: 7.5 (ref 5.0–8.0)

## 2013-12-10 LAB — CBC WITH DIFFERENTIAL/PLATELET
Basophils Absolute: 0 10*3/uL (ref 0.0–0.1)
Basophils Relative: 0 % (ref 0–1)
Eosinophils Absolute: 0.1 10*3/uL (ref 0.0–0.7)
Eosinophils Relative: 1 % (ref 0–5)
HCT: 36.1 % (ref 36.0–46.0)
Hemoglobin: 12 g/dL (ref 12.0–15.0)
Lymphocytes Relative: 26 % (ref 12–46)
Lymphs Abs: 2 10*3/uL (ref 0.7–4.0)
MCH: 28.2 pg (ref 26.0–34.0)
MCHC: 33.2 g/dL (ref 30.0–36.0)
MCV: 84.9 fL (ref 78.0–100.0)
Monocytes Absolute: 0.6 10*3/uL (ref 0.1–1.0)
Monocytes Relative: 8 % (ref 3–12)
Neutro Abs: 5 10*3/uL (ref 1.7–7.7)
Neutrophils Relative %: 65 % (ref 43–77)
Platelets: 315 10*3/uL (ref 150–400)
RBC: 4.25 MIL/uL (ref 3.87–5.11)
RDW: 13.3 % (ref 11.5–15.5)
WBC: 7.7 10*3/uL (ref 4.0–10.5)

## 2013-12-10 MED ORDER — POLYETHYLENE GLYCOL 3350 17 G PO PACK: 17.0000 g | PACK | Freq: Every day | ORAL | Status: DC

## 2013-12-10 NOTE — ED Provider Notes (Signed)
CSN: [id number]     Arrival date & time 12/10/13  1335 History   First MD Initiated Contact with Patient 12/10/13 1743     Chief Complaint  Patient presents with  . Abdominal Pain     (Consider location/radiation/quality/duration/timing/severity/associated sxs/prior Treatment) Patient is a 23 y.o. female presenting with abdominal pain.  Abdominal Pain Pain location:  LUQ and LLQ Pain quality: pressure and squeezing   Pain severity:  Mild Duration:  7 days Timing:  Constant Progression:  Waxing and waning Chronicity:  New Context: not alcohol use, not eating, not laxative use, not previous surgeries and not retching   Relieved by:  None tried Worsened by:  Nothing tried Ineffective treatments:  None tried Associated symptoms: constipation   Associated symptoms: no anorexia, no belching, no chest pain, no cough, no diarrhea, no dysuria, no fatigue, no fever, no hematemesis, no hematochezia, no nausea, no shortness of breath and no vomiting     Past Medical History  Diagnosis Date  . No pertinent past medical history   . Hepatitis B    Past Surgical History  Procedure Laterality Date  . No past surgeries     Family History  Problem Relation Age of Onset  . Diabetes Maternal Grandmother   . Hypertension Maternal Grandmother    History  Substance Use Topics  . Smoking status: Never Smoker   . Smokeless tobacco: Never Used  . Alcohol Use: No   OB History   Grav Para Term Preterm Abortions TAB SAB Ect Mult Living   2 2 2  0 0 0 0 0 0 2     Review of Systems  Constitutional: Negative for fever, activity change and fatigue.  HENT: Negative for congestion and facial swelling.   Eyes: Negative for discharge and redness.  Respiratory: Negative for cough and shortness of breath.   Cardiovascular: Negative for chest pain and palpitations.  Gastrointestinal: Positive for abdominal pain and constipation. Negative for nausea, vomiting, diarrhea, hematochezia, abdominal  distention, anorexia and hematemesis.  Endocrine: Negative for polydipsia and polyuria.  Genitourinary: Negative for dysuria and menstrual problem.  Musculoskeletal: Negative for back pain and joint swelling.  Skin: Negative for color change and wound.  Neurological: Negative for dizziness, light-headedness and headaches.      Allergies  Review of patient's allergies indicates no known allergies.  Home Medications   Prior to Admission medications   Medication Sig Start Date End Date Taking? Authorizing Provider  ibuprofen (ADVIL,MOTRIN) 200 MG tablet Take 400 mg by mouth daily as needed for headache (pain).    Yes Historical Provider, MD  levonorgestrel-ethinyl estradiol (AVIANE,ALESSE,LESSINA) 0.1-20 MG-MCG tablet Take 1 tablet by mouth daily.   Yes Historical Provider, MD  Probiotic Product (PROBIOTIC DAILY PO) Take 1 capsule by mouth daily.   Yes Historical Provider, MD  polyethylene glycol (MIRALAX / GLYCOLAX) packet Take 17 g by mouth daily. 12/10/13   Marily MemosJason Mesner, MD   BP 117/63  Pulse 63  Temp(Src) 97.8 F (36.6 C) (Oral)  Resp 16  SpO2 100%  LMP 11/20/2013 Physical Exam  Nursing note and vitals reviewed. Constitutional: She is oriented to person, place, and time. She appears well-developed and well-nourished.  HENT:  Head: Normocephalic and atraumatic.  Eyes: Conjunctivae and EOM are normal. Right eye exhibits no discharge. Left eye exhibits no discharge.  Cardiovascular: Normal rate and regular rhythm.   Pulmonary/Chest: Effort normal and breath sounds normal. No respiratory distress.  Abdominal: Soft. She exhibits no distension. There is tenderness (minimal to llq  and luq). There is no rebound.  Musculoskeletal: Normal range of motion. She exhibits no edema and no tenderness.  Neurological: She is alert and oriented to person, place, and time.  Skin: Skin is warm and dry.    ED Course  Procedures (including critical care time) Labs Review Labs Reviewed   URINALYSIS, ROUTINE W REFLEX MICROSCOPIC - Abnormal; Notable for the following:    Specific Gravity, Urine 1.031 (*)    All other components within normal limits  BASIC METABOLIC PANEL  CBC WITH DIFFERENTIAL  POC URINE PREG, ED    Imaging Review Dg Abd 2 Views  12/10/2013   CLINICAL DATA:  ABDOMINAL PAIN  EXAM: ABDOMEN - 2 VIEW  COMPARISON:  None.  FINDINGS: The bowel gas pattern is normal. There is no evidence of free air. No radio-opaque calculi or other significant radiographic abnormality is seen. Small to moderate amount of stool in the region of the ascending colon and sigmoid colon.  IMPRESSION: Nonobstructive bowel gas pattern with a small to moderate amount of fecal retention.   Electronically Signed   By: Salome HolmesHector  Cooper M.D.   On: 12/10/2013 18:52     EKG Interpretation None      MDM   Final diagnoses:  Slow transit constipation    23 yo F w/o significant PMH here with llq and luq abdominal pain for the last week. No fevers, nausea, vomiting or diarrhea. Has had decreased caliber and amount of stools. Exam with minimal ttp to left side of abdomen but no peritonitis. Normal labs. xr with evidence of stool burden, likely constipation as cause of her symptoms. Has leg pain as well but more intermittent and not currently hurting. Unsure of cause. Good pulse and neuro function. Likely MSK, no evidence of DVT, can bear weight so doubt fracture or septic joint. No rash to suggest cellulitis.     Marily MemosJason Mesner, MD 12/11/13 2121

## 2013-12-10 NOTE — ED Notes (Signed)
Pt. Stated, Ive had abd. Pain for about a week off and on Im also having pain in my rt. Leg.

## 2013-12-10 NOTE — ED Notes (Signed)
MD at bedside. 

## 2013-12-10 NOTE — Discharge Instructions (Signed)
°Emergency Department Resource Guide °1) Find a Doctor and Pay Out of Pocket °Although you won't have to find out who is covered by your insurance plan, it is a good idea to ask around and get recommendations. You will then need to call the office and see if the doctor you have chosen will accept you as a new patient and what types of options they offer for patients who are self-pay. Some doctors offer discounts or will set up payment plans for their patients who do not have insurance, but you will need to ask so you aren't surprised when you get to your appointment. ° °2) Contact Your Local Health Department °Not all health departments have doctors that can see patients for sick visits, but many do, so it is worth a call to see if yours does. If you don't know where your local health department is, you can check in your phone book. The CDC also has a tool to help you locate your state's health department, and many state websites also have listings of all of their local health departments. ° °3) Find a Walk-in Clinic °If your illness is not likely to be very severe or complicated, you may want to try a walk in clinic. These are popping up all over the country in pharmacies, drugstores, and shopping centers. They're usually staffed by nurse practitioners or physician assistants that have been trained to treat common illnesses and complaints. They're usually fairly quick and inexpensive. However, if you have serious medical issues or chronic medical problems, these are probably not your best option. ° °No Primary Care Doctor: °- Call [facility] at  832-8000 - they can help you locate a primary care doctor that  accepts your insurance, provides certain services, etc. °- Physician Referral Service- 1-800-533-3463 ° °Chronic Pain Problems: °Organization         Address  Phone   Notes  °[facility]  ([phone]) [phone] Patients need to be referred by their primary care doctor.  ° °Medication  Assistance: °Organization         Address  Phone   Notes  °[place] [facility] [address] °[place], [place] [address] (336) 641-8030 --Must be a resident of [place] °-- Must have NO insurance coverage whatsoever (no Medicaid/ Medicare, etc.) °-- The pt. MUST have a primary care doctor that directs their care regularly and follows them in the community °  °[facility]  (866) 331-1348   °[employer]  (888) 892-1162   ° °Agencies that provide inexpensive medical care: °Organization         Address  Phone   Notes  °[facility]  (336) 832-8035   °[facility]    (336) 832-7272   °[facility] [address] °[place], [place] [address] (336) 832-4777   °[facility] [address], °[place] (336) 271-4999   °[facility]    (336) 373-0678   °[facility]    (336) 272-1050   °[facility] ° [address], [place] Phone:  (336) 832-4444, Fax:  (336) 832-4440 Hours of Operation:  9 am - 6 pm, M-F.  Also accepts Medicaid/Medicare and self-pay.  °[facility] ° [address], [place] Phone: (336) 832-3150, Fax: (336) [phone]. Hours of Operation:  8:30 am - 5:30 pm, M-F.  Also accepts Medicaid and self-pay.  °[facility] [place] [address], [place] Phone: (336) 878-6027   °[facility] [address], [place], [place] (336)723-1848, Ext. 123 Mondays & Thursdays: 7-9 AM.  First 15 patients are seen on a first come, first serve basis. °  ° °Medicaid-accepting [place] Providers: ° °Organization         Address  Phone   Notes  °[facility] [address], [place] (336) 641-2100 Also accepts self-pay patients.  °[facility] [address], [place] ° (336) 856-9996   °[facility] [address], [place]  (336) 288-8857   °[facility] [address] [place] [address], [place] (336) 299-7000   °Veita Bland [address], [place]  ° (336) 373-1557 Only accepts [place] Access Medicaid patients after they have their name applied to their card.  ° °Self-Pay (no insurance) in [place]: ° °Organization         Address  Phone   Notes  °Sickle Cell Patients, [facility] [address], [place] (336) 832-1970   °[facility] [address], [place] (336) 832-4400   °[facility] ° [address], [place] (336) 992-4800   °[facility]/Dr. Osei-Bonsu ° [address] [place] [address], [place] or [address], [place] (336) [phone] Phone number for both [place] and [place] locations is the same.  °[facility] [address], [place] (336) 299-0000   °[facility] [address] [place] [address] [place] or [address] (336) 852-7530 °(336) 878-2260   °[facility] [address], [place] (336) 350-1642, phone; (336) 294-5005, fax Sees patients 1st and 3rd Saturday of every month.  Must not qualify for public or private insurance (i.e. Medicaid, Medicare, [place] Health Choice, Veterans' Benefits) • Household income should be no more than 200% of the poverty level •The clinic cannot treat you if you are pregnant or think you are pregnant • Sexually transmitted diseases are not treated at the clinic.  ° ° °Dental Care: °Organization         Address  Phone  Notes  °[place] [facility] [address], [place] (336) 641-6152 Accepts children up to age 21 who are enrolled in Medicaid or [place] Health Choice; pregnant women with a Medicaid card; and children who have applied for Medicaid or [place] Health Choice, but were declined, whose parents can pay a reduced fee at time of service.  °[place]  [facility] [place]  [address], [place] (336) 641-7733 Accepts children up to age 21 who are enrolled in Medicaid or [place] Health Choice; pregnant women with a Medicaid card; and children who have applied for Medicaid or  Health Choice, but were declined, whose parents can pay a reduced fee at time of service.  °[facility] ° [address],  (336) 641-4533 Patients are seen by appointment only. Walk-ins are not accepted. [facility] will see patients 18 years of age and older. °Monday - Tuesday (8am-5pm) °Most Wednesdays (8:30-5pm) °$30 per visit, cash only  °[facility] ° [address], [place] (336) 641-4533 Patients are seen by appointment only. Walk-ins are not accepted. [facility] will see patients 18 years of age and older. °One   Wednesday Evening (Monthly: Volunteer Based).  $30 per visit, cash only  °[facility]  (919) 537-3737 for adults; Children under age 4, call Graduate Pediatric Dentistry at (919) [phone]. Children aged 4-14, please call (919) [phone] to request a pediatric application. ° Dental services are provided in all areas of dental care including fillings, crowns and bridges, complete and partial dentures, implants, gum treatment, root canals, and extractions. Preventive care is also provided. Treatment is provided to both adults and children. °Patients are selected via a lottery and there is often a waiting list. °  °[facility] [address], °[place] ° (336) 763-8833 [email or web address] °  °[facility] [address], [place], [place] (336)723-1848, Ext. 123 Second and Fourth Thursday of each month, opens at 6:30 AM; Clinic ends at 9 AM.  Patients are seen on a first-come first-served basis, and a limited number are seen during each clinic.  ° °[facility] ° [address], [place], [place] (336) 723-7904    Eligibility Requirements °You must have lived in [place], [place], or [place] counties for at least the last three months. °  You cannot be eligible for state or federal sponsored healthcare insurance, including Veterans Administration, Medicaid, or Medicare. °  You generally cannot be eligible for healthcare insurance through your employer.  °  How to apply: °Eligibility screenings are held every Tuesday and Wednesday afternoon from 1:00 pm until 4:00 pm. You do not need an appointment for the interview!  °[facility] [address], [place], [place] 336-631-2330   °[facility]  336-342-8273   °[facility]  336-703-3100   °[facility]  336-570-6415   ° °Behavioral Health Resources in the Community: °Intensive Outpatient Programs °Organization         Address  Phone  Notes  °[facility] [address], [place], [place] 336-878-6098   °[facility] [address], [place], [place] 336-832-9800   °[facility] [address], [place], [place] ° 336-882-2125   °[facility] [address],  °[place], [place] 1-800-853-5163 or 336-641-4981   °Substance Abuse Resources °Organization         Address  Phone  Notes  °[facility]  336-882-2125   °[facility]  336-784-9470   °[facility]  336-285-9073   °[facility]  336-845-3988   °Residential & Outpatient Substance Abuse Program  1-800-659-3381   °Psychological Services °Organization         Address  Phone  Notes  ° Health  336- 832-9600   °[facility]  336- 378-7881   °[facility] [address], [place] 1-800-853-5163 or 336-641-4981   ° °Mobile Crisis Teams °Organization         Address  Phone  Notes  °[facility], Mobile Crisis Care Unit  1-877-626-1772   °Assertive °[facility] ° [address]  [place] 336-834-9664   °Sharon DeEsch [address] °[place]  336-554-5454   ° °Self-Help/Support Groups °Organization         Address  Phone             Notes  °[facility] - variety of support groups  336- 373-1402 Call for more information  °Narcotics Anonymous (NA), [facility] [address], °[place]   2 meetings at this location  ° °  Residential Treatment Programs °Organization         Address  Phone  Notes  °[facility] [address],    °[place]  1-866-801-8205   °[facility] ° [address], [place], [place] 704-293-8524   °[facility] [address], [place] 336-845-3988 Admissions: 8am-3pm M-F  °[facility] [address]    °[place], [place] 336-841-1104   °[facility] [address], [place], [place] 336-379-7146   °[facility] [address]  °[place], [place] 336-285-9073   °[facility] - Intensive Outpatient [address], [place], [place] 336-852-3033   °[facility] [address]  °[place], [place] 1-877-615-2722 or 336-784-9470   °[facility] [address] [place], [place] 336-227-7417 Accepts Medicaid  °[facility] [address]  °[place] 1-800-659-3381 Substance Abuse/Addiction Treatment  ° °[place] Behavioral Health Resources °Organization         Address  Phone  Notes  °[facility]  (888) 581-9988   °Julie Brannon, PhD [address] [place], [place]   (336) 349-5553 or (336) 951-0000   °[facility]   [address] °[place], [place] (336) 349-4454   °[facility] [address], [place], [place] (336) 342-8316 Insurance/Medicaid/sponsorship through [facility]  °[facility] [address]                                    [place], [place] (336) 342-8316 Therapy/tele-psych/case    °[facility] [address]  ° [place], [place] (336) 349-2233    °Dr. Arfeen  (336) 349-4544   °[facility] [place]  [employer] [place] [facility] 1) [address], [place] °2) [address], [place] °3)  [address], [place] (336) 349-3220 °(336) 342-7768 ° °(336) 342-8140   °[place] [facility] (336) 342-1394 or (336) 342-3537 (After Hours)    ° ° °

## 2013-12-10 NOTE — ED Notes (Signed)
PT ambulated with baseline gait; VSS; A&Ox3; no signs of distress; respirations even and unlabored; skin warm and dry; no questions upon discharge.  

## 2013-12-13 NOTE — ED Provider Notes (Signed)
This patient was seen in conjunction with the resident physician, Dr. Clayborne DanaMesner.  The documentation accurately reflects the patient's ED evaluation.  On my exam, the patient was awake and alert, in no distress, with a soft, non-peritoneal abdomen, with pain focally about only the left abdomen.  Colleen Munchobert Lockwood, MD 12/13/13 1128

## 2014-04-22 ENCOUNTER — Encounter (HOSPITAL_COMMUNITY): Payer: Self-pay | Admitting: Emergency Medicine

## 2014-06-21 NOTE — L&D Delivery Note (Cosign Needed)
Delivery Note At 3:31 PM a viable and healthy female was delivered via  (Presentation:  Direct OA).  APGAR: 9 and 9; weight 5lb 13oz.   Placenta status: intact, .  Cord:  with the following complications: None.  Cord pH: n/a  Anesthesia: None  Episiotomy: None Lacerations:  None Est. Blood Loss (mL): 300   Mom to postpartum.  Baby to Couplet care / Skin to Skin.  Rochele PagesKARIM, WALIDAH N 06/15/2015, 3:42 PM

## 2014-12-25 ENCOUNTER — Other Ambulatory Visit (INDEPENDENT_AMBULATORY_CARE_PROVIDER_SITE_OTHER): Payer: Self-pay

## 2014-12-25 DIAGNOSIS — Z3481 Encounter for supervision of other normal pregnancy, first trimester: Secondary | ICD-10-CM

## 2014-12-25 NOTE — Progress Notes (Signed)
New ob labs done today Colleen Daniels 

## 2014-12-26 LAB — OBSTETRIC PANEL
Antibody Screen: NEGATIVE
BASOS ABS: 0 10*3/uL (ref 0.0–0.1)
Basophils Relative: 0 % (ref 0–1)
Eosinophils Absolute: 0.1 10*3/uL (ref 0.0–0.7)
Eosinophils Relative: 1 % (ref 0–5)
HCT: 36.9 % (ref 36.0–46.0)
Hemoglobin: 12.4 g/dL (ref 12.0–15.0)
Hepatitis B Surface Ag: NEGATIVE
Lymphocytes Relative: 16 % (ref 12–46)
Lymphs Abs: 1.6 10*3/uL (ref 0.7–4.0)
MCH: 28.3 pg (ref 26.0–34.0)
MCHC: 33.6 g/dL (ref 30.0–36.0)
MCV: 84.2 fL (ref 78.0–100.0)
MONO ABS: 0.5 10*3/uL (ref 0.1–1.0)
MPV: 10.4 fL (ref 8.6–12.4)
Monocytes Relative: 5 % (ref 3–12)
NEUTROS PCT: 78 % — AB (ref 43–77)
Neutro Abs: 7.8 10*3/uL — ABNORMAL HIGH (ref 1.7–7.7)
Platelets: 281 10*3/uL (ref 150–400)
RBC: 4.38 MIL/uL (ref 3.87–5.11)
RDW: 14.2 % (ref 11.5–15.5)
Rh Type: POSITIVE
Rubella: 1.61 Index — ABNORMAL HIGH (ref <0.90)
WBC: 10 10*3/uL (ref 4.0–10.5)

## 2014-12-26 LAB — HIV ANTIBODY (ROUTINE TESTING W REFLEX): HIV: NONREACTIVE

## 2014-12-27 LAB — CULTURE, OB URINE: Colony Count: 2000

## 2015-01-01 ENCOUNTER — Other Ambulatory Visit (HOSPITAL_COMMUNITY)
Admission: RE | Admit: 2015-01-01 | Discharge: 2015-01-01 | Disposition: A | Discharge status: Home / Self Care | Payer: Self-pay | Source: Ambulatory Visit | Attending: Family Medicine | Admitting: Family Medicine

## 2015-01-01 ENCOUNTER — Encounter: Payer: Self-pay | Admitting: Family Medicine

## 2015-01-01 ENCOUNTER — Ambulatory Visit (INDEPENDENT_AMBULATORY_CARE_PROVIDER_SITE_OTHER): Payer: Self-pay | Admitting: Family Medicine

## 2015-01-01 VITALS — BP 107/54 | HR 82 | Temp 97.9°F | Wt 164.0 lb

## 2015-01-01 DIAGNOSIS — Z113 Encounter for screening for infections with a predominantly sexual mode of transmission: Secondary | ICD-10-CM | POA: Insufficient documentation

## 2015-01-01 DIAGNOSIS — N912 Amenorrhea, unspecified: Secondary | ICD-10-CM

## 2015-01-01 DIAGNOSIS — O9989 Other specified diseases and conditions complicating pregnancy, childbirth and the puerperium: Secondary | ICD-10-CM

## 2015-01-01 DIAGNOSIS — O26899 Other specified pregnancy related conditions, unspecified trimester: Secondary | ICD-10-CM | POA: Insufficient documentation

## 2015-01-01 DIAGNOSIS — Z3481 Encounter for supervision of other normal pregnancy, first trimester: Secondary | ICD-10-CM

## 2015-01-01 DIAGNOSIS — R109 Unspecified abdominal pain: Secondary | ICD-10-CM

## 2015-01-01 DIAGNOSIS — O209 Hemorrhage in early pregnancy, unspecified: Secondary | ICD-10-CM

## 2015-01-01 LAB — POCT WET PREP (WET MOUNT): Clue Cells Wet Prep Whiff POC: NEGATIVE

## 2015-01-01 LAB — POCT URINALYSIS DIPSTICK
Bilirubin, UA: NEGATIVE
Blood, UA: NEGATIVE
Glucose, UA: NEGATIVE
KETONES UA: NEGATIVE
LEUKOCYTES UA: NEGATIVE
Nitrite, UA: NEGATIVE
PH UA: 5.5
Urobilinogen, UA: 0.2

## 2015-01-01 LAB — POCT URINE PREGNANCY: Preg Test, Ur: POSITIVE — AB

## 2015-01-01 NOTE — Patient Instructions (Signed)
Dear Colleen Daniels, Thank you for coming in to clinic today for new OB visit.  1. For your pregnancy - we believe you are at 13 wks, however this may not be positive. 2. Your symptoms with abdominal pain and some vaginal bleeding are concerning. We discussed possibility of Ectopic Pregnancy (outside uterus, this is an emergency and I don't think you have this, but we have to be sure), also could be a "Threatened Abortion" with bleeding that has not or may not happen at all. 3. No bleeding on my exam today. Your Uterus does feel small less than 13 weeks. We did hear Baby Heart beat, which is reassuring 4. [facility] - please go to your appointment and get this test completed, we will call or follow-up the results. - Additionally today checked STD swab (results in few days) and other Urine studies (I don't think you have an infection)  Between now and your next visit... - Recommend against any further sexual intercourse (this can provoke some bleeding, and we don't want to complicate the scenario) - If you develop worsening abdominal pain that does not get better, continued or heavier bleeding, passing tissue or large clots, or any other persistent worsening symptoms (severe HA not improving with Tylenol, continued nausea / vomiting, not able to keep down liquids) it is important that you seek immediate care. [facility] for evaluation (at any time/day of week)  Please schedule a follow-up appointment with [facility] (Faculty Doctor) in 2 to 4 weeks, and then next visit 4 weeks after that with Dr. Beverely LowElena Adamo (new primary doctor)  If you have any other questions or concerns, please feel free to call the clinic to contact me. You may also schedule an earlier appointment if necessary.  However, if your symptoms get significantly worse, please go to the Emergency Department to seek immediate medical attention.  Saralyn PilarAlexander  Sheniah Supak, DO [facility]

## 2015-01-01 NOTE — Progress Notes (Signed)
History provided by patient in [place] without difficulty. Spanish language interpreter Colleen Daniels from ([employer] present) S: Colleen Daniels is a 24 y.o. [id number] at 8374w0d by estimated LMP (10/02/14).   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 Overall, 4-5x episodes within this past month, none longer than 1 day, not seem to be associated with pelvic or abdominal pain. Last episode yesterday with some bright red bleeding, spontaneously without prior intercourse or strenuous activity. No bleeding today.  ABDOMINAL PAIN / DIFFICULTY: - Abdominal pain started 2 months ago, recently after dx of pregnancy. Usually starts on Left lower abd sharp max 8-9/10 then improves to 5/10, seems to be constant with almost daily episodes but then does have some times hours to day without any pain 0/10. Otherwise pain not improved by anything (rest, medicine, hydration / PO). Associated with severe headaches frontal (no prior history of headaches prior to or during other pregnancies) improved with Tylenol. - Tried Tylenol without relief - Admits nausea with vomiting, similar to previous pregnancy, some back pains - Denies fevers/chills, edema, vision changes, dysuria, hematuria  Routine Prenatal Issues: Planned pregnancy, previously on Nuva Ring Contraception had self removed 2 months prior. - Taking PNV daily, Tylenol PRN (rarely) - Weight gain appropriate, tolerating some PO - Denies any vision changes, edema, dysuria, constipated, diarrhea  Fetal Movement - not felt, too early Pain / Contractions - abdominal pain (see above) Admits clear liquid / mucus-like vaginal discharge x 1 week  Denies any fluid leakage, vaginal  bleeding  O: VS above  Gen - well-appearing, comfortable, cooperative, NAD HEENT - PERRL, EOMI, MMM Neck - supple non-tender, no LAD, no thyromegaly  Heart - RRR, no murmurs Lungs - CTAB, non-labored Abd - soft, difficult to determine uterine size on abdominal exam no obvious >13 wk uterus, mild +TTP deep palpation LLQ without rebound, no peritoneal signs, no other abd tenderness, no guarding. +active BS Ext - non-tender, no edema, peripheral pulses intact +2 b/l MSK - back nontender no deformities Pelvic exam - Normal external female genitalia. Vaginal canal without lesions. No blood in vaginal vault. Normal appearing cervix, without lesions or bleeding. Increased thick discharge on exam. Bimanual exam with appreciable uterine vs adnexal fullness on Left > Right with mild generalized discomfort bilateral palpation. No CMT. No pap smear collected.  A&P: Normal supervision of pregnancy, in 1st trimester, at possible 2974w0d by estimated LMP (4/13). Reviewed prenatal labs. Due for pap smear (deferred today due to h/o vaginal bleeding) - Collected GC/Chlamydia (pending) Declines genetic testing. Early glucola / GTT not indicated. - cont daily PNV, Tylenol PRN  # Abdominal Pain, LLQ in pregnancy, chronic intermittent x 2 months Unclear etiology, too early to be round ligament pain or other more reassuring pelvic pain. Does not seem cramping or consistent with contractions. Unlikely Ectopic Pregnancy given chronic nature, currently asymptomatic without pain, hemodynamically stable. However concerns with accuracy of dating based on LMP, no prior US to confirm IUP. Combined with vaginal bleeding will need US. Consider PID, seems less likely afebrile, does have some vaginal discharge recently. - [facility] Limited [facility] < 14 wk [facility] (scheduled for 7/19 @ 1:00pm) - Check GC/Chlamydia today - Check UA (negative, except spec grav >1.030 possible dehydration), Wet prep (+bacteria  and WBCs,  otherwise negative no BV, trich, yeast) - Strict return precautions given, advised urgent follow-up at [facility] if worsening, explained in detail that potential ruptured ectopic would be emergency.  # Vaginal Spotting / Bleeding, 1st trimester Multiple brief episodes bright red blood 4-5x within past 1 month, initially only post-coital, which is reassuring and likely normal, however with repeated episodes, some recently unprovoked. Concern for possible threatened abortion, will need further eval with [place]. - Check [place] as above - Deferred Pap smear today due to bleeding - No sexual intercourse until [place] and results reviewed at next apt

## 2015-01-02 LAB — CERVICOVAGINAL ANCILLARY ONLY
Chlamydia: NEGATIVE
Neisseria Gonorrhea: NEGATIVE

## 2015-01-05 ENCOUNTER — Inpatient Hospital Stay (HOSPITAL_COMMUNITY): Payer: Self-pay

## 2015-01-05 ENCOUNTER — Inpatient Hospital Stay (HOSPITAL_COMMUNITY)
Admission: AD | Admit: 2015-01-05 | Discharge: 2015-01-05 | Disposition: A | Discharge status: Home / Self Care | Payer: Self-pay | Source: Ambulatory Visit | Attending: Obstetrics and Gynecology | Admitting: Obstetrics and Gynecology

## 2015-01-05 ENCOUNTER — Encounter (HOSPITAL_COMMUNITY): Payer: Self-pay | Admitting: *Deleted

## 2015-01-05 DIAGNOSIS — O418X90 Other specified disorders of amniotic fluid and membranes, unspecified trimester, not applicable or unspecified: Secondary | ICD-10-CM | POA: Insufficient documentation

## 2015-01-05 DIAGNOSIS — O99891 Other specified diseases and conditions complicating pregnancy: Secondary | ICD-10-CM

## 2015-01-05 DIAGNOSIS — M549 Dorsalgia, unspecified: Secondary | ICD-10-CM | POA: Insufficient documentation

## 2015-01-05 DIAGNOSIS — Z3689 Encounter for other specified antenatal screening: Secondary | ICD-10-CM | POA: Insufficient documentation

## 2015-01-05 DIAGNOSIS — R109 Unspecified abdominal pain: Secondary | ICD-10-CM

## 2015-01-05 DIAGNOSIS — O26899 Other specified pregnancy related conditions, unspecified trimester: Secondary | ICD-10-CM

## 2015-01-05 DIAGNOSIS — Z3A14 14 weeks gestation of pregnancy: Secondary | ICD-10-CM | POA: Insufficient documentation

## 2015-01-05 DIAGNOSIS — O43892 Other placental disorders, second trimester: Secondary | ICD-10-CM

## 2015-01-05 DIAGNOSIS — O209 Hemorrhage in early pregnancy, unspecified: Secondary | ICD-10-CM | POA: Insufficient documentation

## 2015-01-05 DIAGNOSIS — O9989 Other specified diseases and conditions complicating pregnancy, childbirth and the puerperium: Secondary | ICD-10-CM

## 2015-01-05 DIAGNOSIS — O468X9 Other antepartum hemorrhage, unspecified trimester: Secondary | ICD-10-CM

## 2015-01-05 LAB — CBC
HCT: 34.9 % — ABNORMAL LOW (ref 36.0–46.0)
Hemoglobin: 12.1 g/dL (ref 12.0–15.0)
MCH: 29 pg (ref 26.0–34.0)
MCHC: 34.7 g/dL (ref 30.0–36.0)
MCV: 83.7 fL (ref 78.0–100.0)
PLATELETS: 244 10*3/uL (ref 150–400)
RBC: 4.17 MIL/uL (ref 3.87–5.11)
RDW: 14.1 % (ref 11.5–15.5)
WBC: 10.5 10*3/uL (ref 4.0–10.5)

## 2015-01-05 LAB — COMPREHENSIVE METABOLIC PANEL
ALT: 14 U/L (ref 14–54)
AST: 17 U/L (ref 15–41)
Albumin: 3.3 g/dL — ABNORMAL LOW (ref 3.5–5.0)
Alkaline Phosphatase: 55 U/L (ref 38–126)
Anion gap: 3 — ABNORMAL LOW (ref 5–15)
BUN: 10 mg/dL (ref 6–20)
CHLORIDE: 108 mmol/L (ref 101–111)
CO2: 25 mmol/L (ref 22–32)
CREATININE: 0.57 mg/dL (ref 0.44–1.00)
Calcium: 9 mg/dL (ref 8.9–10.3)
GLUCOSE: 111 mg/dL — AB (ref 65–99)
Potassium: 3.7 mmol/L (ref 3.5–5.1)
Sodium: 136 mmol/L (ref 135–145)
Total Bilirubin: 0.8 mg/dL (ref 0.3–1.2)
Total Protein: 6.6 g/dL (ref 6.5–8.1)

## 2015-01-05 LAB — URINALYSIS, ROUTINE W REFLEX MICROSCOPIC
Bilirubin Urine: NEGATIVE
Glucose, UA: NEGATIVE mg/dL
KETONES UR: NEGATIVE mg/dL
LEUKOCYTES UA: NEGATIVE
NITRITE: NEGATIVE
PROTEIN: NEGATIVE mg/dL
Specific Gravity, Urine: 1.025 (ref 1.005–1.030)
UROBILINOGEN UA: 1 mg/dL (ref 0.0–1.0)
pH: 6 (ref 5.0–8.0)

## 2015-01-05 LAB — URINE MICROSCOPIC-ADD ON

## 2015-01-05 MED ORDER — CYCLOBENZAPRINE HCL 10 MG PO TABS
10.0000 mg | ORAL_TABLET | Freq: Once | ORAL | Status: AC
  Administered 2015-01-05: 10 mg via ORAL
  Filled 2015-01-05: qty 1

## 2015-01-05 MED ORDER — CYCLOBENZAPRINE HCL 10 MG PO TABS: 10.0000 mg | ORAL_TABLET | Freq: Two times a day (BID) | ORAL | Status: DC | PRN

## 2015-01-05 NOTE — MAU Provider Note (Signed)
History     CSN: [id number]  Arrival date and time: 01/05/15 1722   First Provider Initiated Contact with Patient 01/05/15 1751      Chief Complaint  Patient presents with  . Vaginal Bleeding  . Abdominal Pain   HPI23 yo G3P2 at [redacted]w[redacted]d pregnant - pt of [facility]- had first prenatal visit on 01/01/2015- with wet prep and Gc/Chlamydia Pap deferred due to bleeding; pt scheduled for outpatient ultrasound 01/09/2015 due to intermittent bleeding  Pt had IC last night- no pain- bleeding started today Pain is worse with movement Pt states her pain is worse with movement- located bilaterally lower abdomen- worse on left Pt has been nauseated but not vomiting; pt has had some constipation- however had bowel movement today without  Improving pain Pt denies UTI sx Pt has hx of NSVD at [redacted] weeks GA x 2 RN note: Registered Nurse Addendum  MAU Note 01/05/2015 5:36 PM    Expand All Collapse All   C/o intermittent vaginal bleeding for a month but started bleeding heavier @ 1700 today with stronger cramps; scheduled for u/s this Thurs because of this intermittent bleeding; having sever p[ain on the L lower abdomen; crying with pain and upset; FHR dopplered @ 145; heavy bright red bleeding-no clots since 1700 this afternoon;         Past Medical History  Diagnosis Date  . No pertinent past medical history   . Hepatitis B     Past Surgical History  Procedure Laterality Date  . No past surgeries      Family History  Problem Relation Age of Onset  . Diabetes Maternal Grandmother   . Hypertension Maternal Grandmother     History  Substance Use Topics  . Smoking status: Never Smoker   . Smokeless tobacco: Never Used  . Alcohol Use: No    Allergies: No Known Allergies  Prescriptions prior to admission  Medication Sig Dispense Refill Last Dose  . ibuprofen (ADVIL,MOTRIN) 200 MG tablet Take 400 mg by mouth daily as needed for headache (pain).    Not Taking  . polyethylene  glycol (MIRALAX / GLYCOLAX) packet Take 17 g by mouth daily. (Patient not taking: Reported on 01/01/2015) 14 each 2 Not Taking  . Probiotic Product (PROBIOTIC DAILY PO) Take 1 capsule by mouth daily.   Taking    Review of Systems  Constitutional: Negative for fever and chills.  Gastrointestinal: Positive for nausea, abdominal pain and constipation. Negative for vomiting and diarrhea.  Genitourinary: Negative for dysuria and urgency.  Neurological: Positive for dizziness and headaches.   Physical Exam   Blood pressure 109/62, pulse 74, temperature 97.6 F (36.4 C), temperature source Oral, resp. rate 20, weight 164 lb (74.39 kg), last menstrual period 10/02/2014.  Physical Exam  Nursing note and vitals reviewed. Constitutional: She is oriented to person, place, and time. She appears well-developed and well-nourished. No distress.  tearful  HENT:  Head: Normocephalic.  Eyes: Pupils are equal, round, and reactive to light.  Neck: Normal range of motion. Neck supple.  Cardiovascular: Normal rate.   Respiratory: Effort normal.  GI: Soft.  Genitourinary:  Pale pink vaginal mucosa Small amount of creamy white discharge in vault Cervix closed, ant lip ?small ulcerations- no bleeding noted Uterus gravid AGA doppler 145bpm   Musculoskeletal: Normal range of motion.  Neurological: She is alert and oriented to person, place, and time.  Skin: Skin is warm and dry.  Psychiatric: She has a normal mood and affect.  MAU Course  Procedures Results for orders placed or performed during the hospital encounter of 01/05/15 (from the past 24 hour(s))  CBC     Status: Abnormal   Collection Time: 01/05/15  6:10 PM  Result Value Ref Range   WBC 10.5 4.0 - 10.5 K/uL   RBC 4.17 3.87 - 5.11 MIL/uL   Hemoglobin 12.1 12.0 - 15.0 g/dL   HCT 16.1 (L) 09.6 - 04.5 %   MCV 83.7 78.0 - 100.0 fL   MCH 29.0 26.0 - 34.0 pg   MCHC 34.7 30.0 - 36.0 g/dL   RDW 40.9 81.1 - 91.4 %   Platelets 244 150 - 400  K/uL  Comprehensive metabolic panel     Status: Abnormal   Collection Time: 01/05/15  6:10 PM  Result Value Ref Range   Sodium 136 135 - 145 mmol/L   Potassium 3.7 3.5 - 5.1 mmol/L   Chloride 108 101 - 111 mmol/L   CO2 25 22 - 32 mmol/L   Glucose, Bld 111 (H) 65 - 99 mg/dL   BUN 10 6 - 20 mg/dL   Creatinine, Ser 7.82 0.44 - 1.00 mg/dL   Calcium 9.0 8.9 - 95.6 mg/dL   Total Protein 6.6 6.5 - 8.1 g/dL   Albumin 3.3 (L) 3.5 - 5.0 g/dL   AST 17 15 - 41 U/L   ALT 14 14 - 54 U/L   Alkaline Phosphatase 55 38 - 126 U/L   Total Bilirubin 0.8 0.3 - 1.2 mg/dL   GFR calc non Af Amer >60 >60 mL/min   GFR calc Af Amer >60 >60 mL/min   Anion gap 3 (L) 5 - 15  Preliminary ultrasound: SLIUP [redacted]w[redacted]d by [place] on 01/05/2015 with EDD 07/01/2015 Placenta posterior fundal 4.2x1.0x5.3 SCH posteriorly at inferior margin of pacenta Ovaries normal cervix normal Results for orders placed or performed during the hospital encounter of 01/05/15 (from the past 24 hour(s))  CBC     Status: Abnormal   Collection Time: 01/05/15  6:10 PM  Result Value Ref Range   WBC 10.5 4.0 - 10.5 K/uL   RBC 4.17 3.87 - 5.11 MIL/uL   Hemoglobin 12.1 12.0 - 15.0 g/dL   HCT 21.3 (L) 08.6 - 57.8 %   MCV 83.7 78.0 - 100.0 fL   MCH 29.0 26.0 - 34.0 pg   MCHC 34.7 30.0 - 36.0 g/dL   RDW 46.9 62.9 - 52.8 %   Platelets 244 150 - 400 K/uL  Comprehensive metabolic panel     Status: Abnormal   Collection Time: 01/05/15  6:10 PM  Result Value Ref Range   Sodium 136 135 - 145 mmol/L   Potassium 3.7 3.5 - 5.1 mmol/L   Chloride 108 101 - 111 mmol/L   CO2 25 22 - 32 mmol/L   Glucose, Bld 111 (H) 65 - 99 mg/dL   BUN 10 6 - 20 mg/dL   Creatinine, Ser 4.13 0.44 - 1.00 mg/dL   Calcium 9.0 8.9 - 24.4 mg/dL   Total Protein 6.6 6.5 - 8.1 g/dL   Albumin 3.3 (L) 3.5 - 5.0 g/dL   AST 17 15 - 41 U/L   ALT 14 14 - 54 U/L   Alkaline Phosphatase 55 38 - 126 U/L   Total Bilirubin 0.8 0.3 - 1.2 mg/dL   GFR calc non Af Amer >60 >60 mL/min   GFR calc  Af Amer >60 >60 mL/min   Anion gap 3 (L) 5 - 15  Urinalysis, Routine w reflex  microscopic (not at [facility])     Status: Abnormal   Collection Time: 01/05/15  7:30 PM  Result Value Ref Range   Color, Urine YELLOW YELLOW   APPearance CLEAR CLEAR   Specific Gravity, Urine 1.025 1.005 - 1.030   pH 6.0 5.0 - 8.0   Glucose, UA NEGATIVE NEGATIVE mg/dL   Hgb urine dipstick SMALL (A) NEGATIVE   Bilirubin Urine NEGATIVE NEGATIVE   Ketones, ur NEGATIVE NEGATIVE mg/dL   Protein, ur NEGATIVE NEGATIVE mg/dL   Urobilinogen, UA 1.0 0.0 - 1.0 mg/dL   Nitrite NEGATIVE NEGATIVE   Leukocytes, UA NEGATIVE NEGATIVE  Urine microscopic-add on     Status: None   Collection Time: 01/05/15  7:30 PM  Result Value Ref Range   Squamous Epithelial / LPF RARE RARE   Bacteria, UA RARE RARE   Urine-Other MUCOUS PRESENT   reviewed labs and ultrasound with patient and partner Flexeril given for side/back pain- pt's pain relieved  Assessment and Plan  Bleeding in pregnancy [facility]- pelvic rest for 2 weeks SLIUP [redacted]w[redacted]d EDD1/03/2016 Back pain- Rx Flexeril; back exercises; maternity belt F/u with OB appointment  Layn Kye 01/05/2015, 5:59 PM

## 2015-01-05 NOTE — MAU Note (Addendum)
C/o intermittent vaginal bleeding for a month but started bleeding heavier @ 1700 today with stronger cramps; scheduled for u/s this Thurs because of this intermittent bleeding; having sever p[ain on the L lower abdomen; crying with pain and upset; FHR dopplered @ 145; heavy bright red bleeding-no clots since 1700 this afternoon;

## 2015-01-07 ENCOUNTER — Ambulatory Visit: Payer: Medicaid Other

## 2015-01-07 ENCOUNTER — Ambulatory Visit (HOSPITAL_COMMUNITY): Payer: Medicaid Other

## 2015-01-09 ENCOUNTER — Ambulatory Visit (HOSPITAL_COMMUNITY): Admission: RE | Admit: 2015-01-09 | Payer: Medicaid Other | Source: Ambulatory Visit

## 2015-01-30 ENCOUNTER — Ambulatory Visit (INDEPENDENT_AMBULATORY_CARE_PROVIDER_SITE_OTHER): Payer: Medicaid Other | Admitting: Family Medicine

## 2015-01-30 ENCOUNTER — Telehealth: Payer: Self-pay | Admitting: Family Medicine

## 2015-01-30 VITALS — BP 105/61 | HR 74 | Temp 98.5°F | Wt 167.0 lb

## 2015-01-30 DIAGNOSIS — Z3482 Encounter for supervision of other normal pregnancy, second trimester: Secondary | ICD-10-CM

## 2015-01-30 LAB — POCT WET PREP (WET MOUNT): CLUE CELLS WET PREP WHIFF POC: NEGATIVE

## 2015-01-30 LAB — GLUCOSE, CAPILLARY
Comment 1: 1
GLUCOSE-CAPILLARY: 97 mg/dL (ref 65–99)

## 2015-01-30 LAB — POCT URINALYSIS DIPSTICK
BILIRUBIN UA: NEGATIVE
Blood, UA: NEGATIVE
GLUCOSE UA: NEGATIVE
Ketones, UA: NEGATIVE
LEUKOCYTES UA: NEGATIVE
NITRITE UA: NEGATIVE
PH UA: 5.5
Protein, UA: NEGATIVE
Spec Grav, UA: >=1.03
Urobilinogen, UA: 0.2

## 2015-01-30 NOTE — Patient Instructions (Signed)
It was nice seeing you today. Your pregnancy seems to be progressing well. We will get glucose test and UA done today. PAP and wet prep was done. I will contact you with results. Please See Dr Richarda Blade in 4 wks.  Prenatal Care  WHAT IS PRENATAL CARE?  Prenatal care means health care during your pregnancy, before your baby is born. It is very important to take care of yourself and your baby during your pregnancy by:   Getting early prenatal care. If you know you are pregnant, or think you might be pregnant, call your health care provider as soon as possible. Schedule a visit for a prenatal exam.  Getting regular prenatal care. Follow your health care provider's schedule for blood and other necessary tests. Do not miss appointments.  Doing everything you can to keep yourself and your baby healthy during your pregnancy.  Getting complete care. Prenatal care should include evaluation of the medical, dietary, educational, psychological, and social needs of you and your significant other. The medical and genetic history of your family and the family of your baby's father should be discussed with your health care provider.  Discussing with your health care provider:  Prescription, over-the-counter, and herbal medicines that you take.  Any history of substance abuse, alcohol use, smoking, and illegal drug use.  Any history of domestic abuse and violence.  Immunizations you have received.  Your nutrition and diet.  The amount of exercise you do.  Any environmental and occupational hazards to which you are exposed.  History of sexually transmitted infections for both you and your partner.  Previous pregnancies you have had. WHY IS PRENATAL CARE SO IMPORTANT?  By regularly seeing your health care provider, you help ensure that problems can be identified early so that they can be treated as soon as possible. Other problems might be prevented. Many studies have shown that early and regular  prenatal care is important for the health of mothers and their babies.  HOW CAN I TAKE CARE OF MYSELF WHILE I AM PREGNANT?  Here are ways to take care of yourself and your baby:   Start or continue taking your multivitamin with 400 micrograms (mcg) of folic acid every day.  Get early and regular prenatal care. It is very important to see a health care provider during your pregnancy. Your health care provider will check at each visit to make sure that you and your baby are healthy. If there are any problems, action can be taken right away to help you and your baby.  Eat a healthy diet that includes:  Fruits.  Vegetables.  Foods low in saturated fat.  Whole grains.  Calcium-rich foods, such as milk, yogurt, and hard cheeses.  Drink 6-8 glasses of liquids a day.  Unless your health care provider tells you not to, try to be physically active for 30 minutes, most days of the week. If you are pressed for time, you can get your activity in through 10-minute segments, three times a day.  Do not smoke, drink alcohol, or use drugs. These can cause long-term damage to your baby. Talk with your health care provider about steps to take to stop smoking. Talk with a member of your faith community, a counselor, a trusted friend, or your health care provider if you are concerned about your alcohol or drug use.  Ask your health care provider before taking any medicine, even over-the-counter medicines. Some medicines are not safe to take during pregnancy.  Get plenty of rest  and sleep.  Avoid hot tubs and saunas during pregnancy.  Do not have X-rays taken unless absolutely necessary and with the recommendation of your health care provider. A lead shield can be placed on your abdomen to protect your baby when X-rays are taken in other parts of your body.  Do not empty the cat litter when you are pregnant. It may contain a parasite that causes an infection called toxoplasmosis, which can cause birth  defects. Also, use gloves when working in garden areas used by cats.  Do not eat uncooked or undercooked meats or fish.  Do not eat soft, mold-ripened cheeses (Brie, Camembert, and chevre) or soft, blue-veined cheese (Danish blue and Roquefort).  Stay away from toxic chemicals like:  Insecticides.  Solvents (some cleaners or paint thinners).  Lead.  Mercury.  Sexual intercourse may continue until the end of the pregnancy, unless you have a medical problem or there is a problem with the pregnancy and your health care provider tells you not to.  Do not wear high-heel shoes, especially during the second half of the pregnancy. You can lose your balance and fall.  Do not take long trips, unless absolutely necessary. Be sure to see your health care provider before going on the trip.  Do not sit in one position for more than 2 hours when on a trip.  Take a copy of your medical records when going on a trip. Know where a hospital is located in the city you are visiting, in case of an emergency.  Most dangerous household products will have pregnancy warnings on their labels. Ask your health care provider about products if you are unsure.  Limit or eliminate your caffeine intake from coffee, tea, sodas, medicines, and chocolate.  Many women continue working through pregnancy. Staying active might help you stay healthier. If you have a question about the safety or the hours you work at your particular job, talk with your health care provider.  Get informed:  Read books.  Watch videos.  Go to childbirth classes for you and your significant other.  Talk with experienced moms.  Ask your health care provider about childbirth education classes for you and your partner. Classes can help you and your partner prepare for the birth of your baby.  Ask about a baby doctor (pediatrician) and methods and pain medicine for labor, delivery, and possible cesarean delivery. HOW OFTEN SHOULD I SEE MY  HEALTH CARE PROVIDER DURING PREGNANCY?  Your health care provider will give you a schedule for your prenatal visits. You will have visits more often as you get closer to the end of your pregnancy. An average pregnancy lasts about 40 weeks.  A typical schedule includes visiting your health care provider:   About once each month during your first 6 months of pregnancy.  Every 2 weeks during the next 2 months.  Weekly in the last month, until the delivery date. Your health care provider will probably want to see you more often if:  You are older than 35 years.  Your pregnancy is high risk because you have certain health problems or problems with the pregnancy, such as:  Diabetes.  High blood pressure.  The baby is not growing on schedule, according to the dates of the pregnancy. Your health care provider will do special tests to make sure you and your baby are not having any serious problems. WHAT HAPPENS DURING PRENATAL VISITS?   At your first prenatal visit, your health care provider will do a  physical exam and talk to you about your health history and the health history of your partner and your family. Your health care provider will be able to tell you what date to expect your baby to be born on.  Your first physical exam will include checks of your blood pressure, measurements of your height and weight, and an exam of your pelvic organs. Your health care provider will do a Pap test if you have not had one recently and will do cultures of your cervix to make sure there is no infection.  At each prenatal visit, there will be tests of your blood, urine, blood pressure, weight, and the progress of the baby will be checked.  At your later prenatal visits, your health care provider will check how you are doing and how your baby is developing. You may have a number of tests done as your pregnancy progresses.  Ultrasound exams are often used to check on your baby's growth and health.  You  may have more urine and blood tests, as well as special tests, if needed. These may include amniocentesis to examine fluid in the pregnancy sac, stress tests to check how the baby responds to contractions, or a biophysical profile to measure your baby's well-being. Your health care provider will explain the tests and why they are necessary.  You should be tested for high blood sugar (gestational diabetes) between the 24th and 28th weeks of your pregnancy.  You should discuss with your health care provider your plans to breastfeed or bottle-feed your baby.  Each visit is also a chance for you to learn about staying healthy during pregnancy and to ask questions. Document Released: 06/10/2003 Document Revised: 06/12/2013 Document Reviewed: 08/22/2013 [facility] Patient Information 2015 [place], [place]. This information is not intended to replace advice given to you by your health care provider. Make sure you discuss any questions you have with your health care provider.

## 2015-01-30 NOTE — Progress Notes (Signed)
Colleen Daniels is a 24 y.o. G3P2002 at [redacted]w[redacted]d for routine follow up.  She reports vaginal irritation and discharge, no bleeding since last visit. See flow sheet for details.  Exam: CV/Lungs: wnl Abd: FH not measurable at this time. FHR 155, bedside U/S done showing normal fetal heart movement.         Mild suprapubic tenderness GU: Cervix friable.        Small vaginal discharge appears normal.  A/P: Pregnancy at [redacted]w[redacted]d.  Doing well.   Pregnancy issues include: Pecola Leisure is doing. Morning sickness with nausea. Last episode of vomiting was 1 wk ago. Occasional suprapubic pain. Denies dysuria. Anatomy ultrasound ordered to be scheduled at 18-19 weeks. Pt  is not interested in genetic screening. Bleeding and pain precautions reviewed. OB UA repeated due to hx of suprapubic pain and mild tenderness on exam. Patient declined quad screening today. PAP completed with wet prep. I will call her with result. Early 1 hr glucola completed due to Hispanic race. Continue prenatal vitamin. Follow up 4 weeks.

## 2015-01-30 NOTE — Telephone Encounter (Signed)
I discussed wet prep and UA result with patient. Her PAP result is pending. All questions were answered.

## 2015-02-03 ENCOUNTER — Telehealth: Payer: Self-pay | Admitting: Family Medicine

## 2015-02-03 LAB — CYTOLOGY - PAP

## 2015-02-03 MED ORDER — FLUCONAZOLE 150 MG PO TABS: 150.0000 mg | ORAL_TABLET | Freq: Once | ORAL | Status: DC

## 2015-02-03 NOTE — Telephone Encounter (Signed)
I called and discussed PAP result with patient. Normal cytology with neg HPV. + Candida. Diflucan sent to the pharmacy. Patient is aware.

## 2015-02-04 ENCOUNTER — Other Ambulatory Visit: Payer: Self-pay | Admitting: Family Medicine

## 2015-02-04 ENCOUNTER — Ambulatory Visit (HOSPITAL_COMMUNITY): Payer: Self-pay

## 2015-02-04 DIAGNOSIS — Z0489 Encounter for examination and observation for other specified reasons: Secondary | ICD-10-CM

## 2015-02-04 DIAGNOSIS — Z3A19 19 weeks gestation of pregnancy: Secondary | ICD-10-CM

## 2015-02-04 DIAGNOSIS — IMO0002 Reserved for concepts with insufficient information to code with codable children: Secondary | ICD-10-CM

## 2015-02-07 ENCOUNTER — Telehealth: Payer: Self-pay | Admitting: Family Medicine

## 2015-02-07 ENCOUNTER — Ambulatory Visit (HOSPITAL_COMMUNITY)
Admission: RE | Admit: 2015-02-07 | Discharge: 2015-02-07 | Disposition: A | Discharge status: Home / Self Care | Payer: Self-pay | Source: Ambulatory Visit | Attending: Family Medicine | Admitting: Family Medicine

## 2015-02-07 DIAGNOSIS — Z36 Encounter for antenatal screening of mother: Secondary | ICD-10-CM | POA: Insufficient documentation

## 2015-02-07 DIAGNOSIS — Z3A19 19 weeks gestation of pregnancy: Secondary | ICD-10-CM | POA: Insufficient documentation

## 2015-02-07 DIAGNOSIS — Z0489 Encounter for examination and observation for other specified reasons: Secondary | ICD-10-CM

## 2015-02-07 DIAGNOSIS — IMO0002 Reserved for concepts with insufficient information to code with codable children: Secondary | ICD-10-CM

## 2015-02-07 NOTE — Telephone Encounter (Signed)
OB U/S report discussed with patient. I discussed need to repeat U/S in 6 wks. She verbalized understanding. Follow up with PCP who will order U/S

## 2015-02-12 ENCOUNTER — Telehealth: Payer: Self-pay | Admitting: Family Medicine

## 2015-02-12 DIAGNOSIS — Z3481 Encounter for supervision of other normal pregnancy, first trimester: Secondary | ICD-10-CM

## 2015-02-12 MED ORDER — PRENATAL VITAMIN 27-0.8 MG PO TABS: 1.0000 | ORAL_TABLET | Freq: Every day | ORAL | Status: DC

## 2015-02-12 NOTE — Telephone Encounter (Signed)
Pt needs to know what prenatal vitamins to get or if MD can call in an Rx for them, Pt goes to [facility]/[address]

## 2015-02-12 NOTE — Telephone Encounter (Signed)
Rx sent to pharmacy for prenatal vitamins. Please inform patient.

## 2015-02-13 NOTE — Telephone Encounter (Signed)
Patient informed. 

## 2015-02-26 ENCOUNTER — Telehealth: Payer: Self-pay | Admitting: Family Medicine

## 2015-02-26 NOTE — Telephone Encounter (Signed)
Pt is scheduled with Dr. Richarda Blade, wants to cancel b/c she cannot come tomorrow but is also requesting to see someone else, says she did research and wants to see someone else.

## 2015-02-27 ENCOUNTER — Encounter: Payer: Self-pay | Admitting: *Deleted

## 2015-02-27 ENCOUNTER — Encounter: Payer: Self-pay | Admitting: Family Medicine

## 2015-02-28 ENCOUNTER — Telehealth: Payer: Self-pay | Admitting: Family Medicine

## 2015-02-28 NOTE — Telephone Encounter (Signed)
Pt called and would like to speak to Dr. Althea Charon about her pregnancy and how she is feeling at this time. jw

## 2015-02-28 NOTE — Telephone Encounter (Signed)
Reviewed chart. Last initial prenatal OV with me 12/2014, she did go to [facility] few days later in 12/2014 for vaginal bleeding. Next follow-up 01/2015 with [facility], overall seemed to be doing well. Now she is due for next 4 week visit, but calls with questions. I called her back, and she reported a constellation of symptoms that had started about 1 week ago and seemed to be worsening gradually over this past week, today seemed more severe. Described symptoms of generalized weakness, dizziness, felt faint but did not pass out or fall, did have some vision symptoms, significant back pain (similar to previous) and pelvic pain mostly cramping, with mild discharge. Denied any fever, n/v, fluid leakage, vaginal bleeding. She does feel regular fetal movements. I advised her that late Friday PM, especially with our clinic closing within the hour and the severity of her symptoms, most likely she should go to [facility] for evaluation. I am unsure the etiology of her symptoms, but seems like overall worsening pain and now may have component of dehydration, but did not sound to be clinically infectious or PTL. She understood and will seek more immediate treatment likely, and definitely if symptoms worsening.  Saralyn Pilar, DO [facility], PGY-3

## 2015-02-28 NOTE — Telephone Encounter (Signed)
I haven't seen this woman yet but apparently she did "research" and doesn't want to see me so I guess she should continue to see you. She hasn't been seen since that initial visit you did and she got that ultrasound that looked ok.

## 2015-03-07 ENCOUNTER — Ambulatory Visit (INDEPENDENT_AMBULATORY_CARE_PROVIDER_SITE_OTHER): Payer: Self-pay | Admitting: Family Medicine

## 2015-03-07 VITALS — BP 106/58 | HR 74 | Temp 98.2°F | Wt 170.2 lb

## 2015-03-07 DIAGNOSIS — Z3482 Encounter for supervision of other normal pregnancy, second trimester: Secondary | ICD-10-CM

## 2015-03-07 DIAGNOSIS — O26819 Pregnancy related exhaustion and fatigue, unspecified trimester: Secondary | ICD-10-CM | POA: Insufficient documentation

## 2015-03-07 DIAGNOSIS — O26812 Pregnancy related exhaustion and fatigue, second trimester: Secondary | ICD-10-CM

## 2015-03-07 LAB — POCT HEMOGLOBIN: Hemoglobin: 12.2 g/dL (ref 12.2–16.2)

## 2015-03-07 NOTE — Patient Instructions (Signed)
Dear Colleen Daniels, Thank you for coming in to clinic today  1. Overall it sounds like you and baby are doing well and are healthy 2. Normal weight gain, keep up good work on your diet 3. Stay well hydrated! Keep drinking extra water daily 4. For back pain may start Tylenol Extra Str 500mg  tabs take 2 every 6-8 hours, or 3 times a day (max 6 tabs in 24 hours), take this regularly for 1-2 weeks for better relief. Can pick up flexeril from pharmacy if you want to try this for your back, Also relative rest and heating pad will help 5. Your abdominal pain, sounds reassuring, it has resolved, this may happen from time to time with repeat pregnancies 6. For fatigue and your symptoms we will check Hemoglobin (iron count) screening today, and will call you with results - if it is low we can send in an additional iron supplement to take daily with prenatal vitamin, otherwise we will check labs again at next visit 7. You passed the 1 hour diabetes sugar test last time, but we still need to repeat this at next visit  - If you develop worsening Dizziness, Fatigue, Weakness, or nearly Pass out, or abdominal pain that does not get better, again have heavier bleeding, passing tissue or large clots, or any other persistent worsening symptoms (severe HA not improving with Tylenol, continued nausea / vomiting, not able to keep down liquids) it is important that you seek immediate care. Go to [facility] for evaluation (at any time/day of week)  Please schedule a follow-up appointment with [facility] in 4 weeks, and then next visit 4 weeks after that with Dr. Althea Charon ( will continue to be your OB provider here)  If you have any other questions or concerns, please feel free to call the clinic to contact me. You may also schedule an earlier appointment if necessary.  However, if your symptoms get significantly worse, please go to the Emergency Department to seek  immediate medical attention.  Saralyn Pilar, DO [facility]  Second Trimester of Pregnancy The second trimester is from week 13 through week 28, months 4 through 6. The second trimester is often a time when you feel your best. Your body has also adjusted to being pregnant, and you begin to feel better physically. Usually, morning sickness has lessened or quit completely, you may have more energy, and you may have an increase in appetite. The second trimester is also a time when the fetus is growing rapidly. At the end of the sixth month, the fetus is about 9 inches long and weighs about 1 pounds. You will likely begin to feel the baby move (quickening) between 18 and 20 weeks of the pregnancy. BODY CHANGES Your body goes through many changes during pregnancy. The changes vary from woman to woman.   Your weight will continue to increase. You will notice your lower abdomen bulging out.  You may begin to get stretch marks on your hips, abdomen, and breasts.  You may develop headaches that can be relieved by medicines approved by your health care provider.  You may urinate more often because the fetus is pressing on your bladder.  You may develop or continue to have heartburn as a result of your pregnancy.  You may develop constipation because certain hormones are causing the muscles that push waste through your intestines to slow down.  You may develop hemorrhoids or swollen, bulging veins (varicose veins).  You may have back pain because of the weight gain and pregnancy hormones relaxing your joints between the bones in your pelvis and as a result of a shift in weight and the muscles that support your balance.  Your breasts will continue to grow and be tender.  Your gums may bleed and may be sensitive to brushing and flossing.  Dark spots or blotches (chloasma, mask of pregnancy) may develop on your face. This will likely fade after the baby is born.  A dark  line from your belly button to the pubic area (linea nigra) may appear. This will likely fade after the baby is born.  You may have changes in your hair. These can include thickening of your hair, rapid growth, and changes in texture. Some women also have hair loss during or after pregnancy, or hair that feels dry or thin. Your hair will most likely return to normal after your baby is born. WHAT TO EXPECT AT YOUR PRENATAL VISITS During a routine prenatal visit:  You will be weighed to make sure you and the fetus are growing normally.  Your blood pressure will be taken.  Your abdomen will be measured to track your baby's growth.  The fetal heartbeat will be listened to.  Any test results from the previous visit will be discussed. Your health care provider may ask you:  How you are feeling.  If you are feeling the baby move.  If you have had any abnormal symptoms, such as leaking fluid, bleeding, severe headaches, or abdominal cramping.  If you have any questions. Other tests that may be performed during your second trimester include:  Blood tests that check for:  Low iron levels (anemia).  Gestational diabetes (between 24 and 28 weeks).  Rh antibodies.  Urine tests to check for infections, diabetes, or protein in the urine.  An ultrasound to confirm the proper growth and development of the baby.  An amniocentesis to check for possible genetic problems.  Fetal screens for spina bifida and Down syndrome. HOME CARE INSTRUCTIONS   Avoid all smoking, herbs, alcohol, and unprescribed drugs. These chemicals affect the formation and growth of the baby.  Follow your health care provider's instructions regarding medicine use. There are medicines that are either safe or unsafe to take during pregnancy.  Exercise only as directed by your health care provider. Experiencing uterine cramps is a good sign to stop exercising.  Continue to eat regular, healthy meals.  Wear a good  support bra for breast tenderness.  Do not use hot tubs, steam rooms, or saunas.  Wear your seat belt at all times when driving.  Avoid raw meat, uncooked cheese, cat litter boxes, and soil used by cats. These carry germs that can cause birth defects in the baby.  Take your prenatal vitamins.  Try taking a stool softener (if your health care provider approves) if you develop constipation. Eat more high-fiber foods, such as fresh vegetables or fruit and whole grains. Drink plenty of fluids to keep your urine clear or pale yellow.  Take warm sitz baths to soothe any pain or discomfort caused by hemorrhoids. Use hemorrhoid cream if your health care provider approves.  If you develop varicose veins, wear support hose. Elevate your feet for 15 minutes, 3-4 times a day. Limit salt in your diet.  Avoid heavy lifting, wear low heel shoes, and practice good posture.  Rest with your legs elevated if you have leg cramps or low back pain.  Visit your dentist if you  have not gone yet during your pregnancy. Use a soft toothbrush to brush your teeth and be gentle when you floss.  A sexual relationship may be continued unless your health care provider directs you otherwise.  Continue to go to all your prenatal visits as directed by your health care provider. SEEK MEDICAL CARE IF:   You have dizziness.  You have mild pelvic cramps, pelvic pressure, or nagging pain in the abdominal area.  You have persistent nausea, vomiting, or diarrhea.  You have a bad smelling vaginal discharge.  You have pain with urination. SEEK IMMEDIATE MEDICAL CARE IF:   You have a fever.  You are leaking fluid from your vagina.  You have spotting or bleeding from your vagina.  You have severe abdominal cramping or pain.  You have rapid weight gain or loss.  You have shortness of breath with chest pain.  You notice sudden or extreme swelling of your face, hands, ankles, feet, or legs.  You have not felt your  baby move in over an hour.  You have severe headaches that do not go away with medicine.  You have vision changes. Document Released: 06/01/2001 Document Revised: 06/12/2013 Document Reviewed: 08/08/2012 [facility] Patient Information 2015 [place], [place]. This information is not intended to replace advice given to you by your health care provider. Make sure you discuss any questions you have with your health care provider.

## 2015-03-07 NOTE — Progress Notes (Signed)
History provided by patient in [place], preferred language. Ad[employer] a Mom program.  S: Colleen Daniels is a 24 y.o. [id number] at [redacted]w[redacted]d by estimated LMP (10/02/14).   Acute Concerns Today:  ABDOMINAL / BACK PAIN - Resolved Reported abdominal pain started yesterday (since completely resolved), described this as starting from top to bottom of abdomen bilaterally as a severe cramping / sharp pains lasted about 6-8 hours then resolved spontanouesly, started in early morning unrelated to any activity at that time (was resting, not eating or active), however did state that she "may have overdone it" the previous day was out all day, did stay hydrated. She has not tried taking Tylenol. Also admits to associated LBP, seems to be more chronic, she was given Flexeril previously but never picked this up from pharmacy.  FATIGUE / WEAKNESS / DIZZINESS: - Improved. Now seems to be mostly fatigue. See details on prior telephone note 9/9 (when I had advised her to go to [facility] if worsening, she did not seek any other treatment or care), described near pre-syncopal episode at that time, denies any LOC. No fall or trauma. Denies any nausea / vomiting, chest pain, SOB. No clear trigger, now seems to have some constant lingering fatigue or malaise with feeling generalized weak and occasionally dizzy, without room spinning. No improvement with eating / drinking, seems to be staying well hydrated. Rest with some improvement but does not resolve.  Routine Prenatal Issues: - Taking PNV daily, Tylenol PRN (rarely for a HA, resolves) - Weight gain appropriate, tolerating PO - Denies any vision changes, edema, dysuria, constipated, diarrhea  Fetal Movement - regular movement Pain / Contractions - recent abdominal pain (see above) Denies any fluid leakage, vaginal bleeding, discharge (note previously had yeast infection on pap, did not take Diflucan but symptoms resolved)  O: BP 106/58 mmHg  Pulse 74  Temp(Src) 98.2 F  (36.8 C)  Wt 170 lb 4 oz (77.225 kg)  LMP 10/02/2014 (Approximate)  Gen - well-appearing, comfortable, cooperative, NAD Abd - soft, appropriately gravid abdomen for GA, non-tender, no rebound, +active BS, no masses Ext - non-tender, no edema, peripheral pulses intact +2 b/l MSK - back nontender no deformities  A&P: Normal supervision of pregnancy, in 2nd trimester, at possible [redacted]w[redacted]d by estimated LMP (4/13). - Resolved prior vaginal bleeding/spotting  Fatigue / Generalized Weakness - Unclear etiology, without any other red flag or concerning symptoms, clinically well hydrated and non-toxic, well-appearing and comfortable today. History of stable Hgb around 12, concern possible anemia in pregnancy, checked POC Hgb today at 12.2, normal. Recommend continue PNV. No other change today. Improve hydration. Reassurance, return criteria given if worsening or repeat episode concerning for pre-syncope. Abdominal Pain - Resolved, likely round ligament pain in subsequent pregnancy, benign abdomen today, since resolved. Not consistent with contractions. LBP - Chronic during pregnancy, may start taking Tylenol PRN, heating pad. Has flexeril at pharmacy, can resend in future if needed  Reviewed last results: - Pap smear (done 8/11, normal, negative High Risk HPV, advised repeat 1-3 years),  - Early 1 hr GTT (Hispanic), normal at 97. Due for repeat 1 hr GTT at normal screening interval 24-28 weeks - Reviewed [place] results (8/19), overall normal growth, placenta, EFW, anatomy, except not fully visualized RVOT, recommend repeat US within 6 weeks, approx early-mid October 2016  RTC 4 weeks for [facility] - Re-offer flu shot. Declined today. - Will be due for repeat 1 hr GTT screening, CBC, HIV, RPR - Consider ordering repeat US for  fully visualizing anatomy  Saralyn Pilar, DO [facility], PGY-3

## 2015-03-24 ENCOUNTER — Ambulatory Visit (INDEPENDENT_AMBULATORY_CARE_PROVIDER_SITE_OTHER): Payer: Self-pay | Admitting: Family Medicine

## 2015-03-24 VITALS — BP 107/58 | HR 76 | Temp 98.0°F | Wt 164.0 lb

## 2015-03-24 DIAGNOSIS — Z114 Encounter for screening for human immunodeficiency virus [HIV]: Secondary | ICD-10-CM

## 2015-03-24 DIAGNOSIS — O2692 Pregnancy related conditions, unspecified, second trimester: Secondary | ICD-10-CM

## 2015-03-24 DIAGNOSIS — Z113 Encounter for screening for infections with a predominantly sexual mode of transmission: Secondary | ICD-10-CM

## 2015-03-24 DIAGNOSIS — O26892 Other specified pregnancy related conditions, second trimester: Secondary | ICD-10-CM

## 2015-03-24 DIAGNOSIS — M545 Low back pain, unspecified: Secondary | ICD-10-CM | POA: Insufficient documentation

## 2015-03-24 DIAGNOSIS — O2612 Low weight gain in pregnancy, second trimester: Secondary | ICD-10-CM

## 2015-03-24 DIAGNOSIS — O26813 Pregnancy related exhaustion and fatigue, third trimester: Secondary | ICD-10-CM

## 2015-03-24 DIAGNOSIS — Z3482 Encounter for supervision of other normal pregnancy, second trimester: Secondary | ICD-10-CM

## 2015-03-24 DIAGNOSIS — Z23 Encounter for immunization: Secondary | ICD-10-CM

## 2015-03-24 LAB — CBC
HEMATOCRIT: 35.3 % — AB (ref 36.0–46.0)
HEMOGLOBIN: 11.8 g/dL — AB (ref 12.0–15.0)
MCH: 29 pg (ref 26.0–34.0)
MCHC: 33.4 g/dL (ref 30.0–36.0)
MCV: 86.7 fL (ref 78.0–100.0)
MPV: 10.5 fL (ref 8.6–12.4)
PLATELETS: 262 10*3/uL (ref 150–400)
RBC: 4.07 MIL/uL (ref 3.87–5.11)
RDW: 13.8 % (ref 11.5–15.5)
WBC: 9.2 10*3/uL (ref 4.0–10.5)

## 2015-03-24 LAB — GLUCOSE, CAPILLARY
Comment 1: 1
Glucose-Capillary: 65 mg/dL (ref 65–99)

## 2015-03-24 MED ORDER — CYCLOBENZAPRINE HCL 10 MG PO TABS: 10.0000 mg | ORAL_TABLET | Freq: Two times a day (BID) | ORAL | Status: DC | PRN

## 2015-03-24 NOTE — Patient Instructions (Signed)
Dear Rosaland Lao, Thank you for coming in to clinic today  1. Overall it sounds like you and baby are doing well and are healthy 2. Weight gain has reduced - important to gain a little more weight at this point in pregnancy - Try to work on small frequent meals (up to 4- 6 daily), increase protein - Try over the counter - Vitamin B6 and or Doxylamine (ask pharmacist to help you locate these), also may try Ginger Supplement OTC for nausea, these medicines are safe in pregnancy and will help your nausea 3. Stay well hydrated! Keep drinking extra water daily 4. For back pain may start Tylenol Extra Str  tabs take 2 every 6-8 hours, or 3 times a day (max 6 tabs in 24 hours), take this regularly for 1-2 weeks for better relief. Can pick up flexeril from pharmacy if you want to try this for your back, Also relative rest and heating pad will help - Refilled Flexeril - take half tab as needed for back pain, if not helping can take 1 whole tab,  May cause sedation or drowsy  Check blood work and 1 hour sugar test - will call you with any concerning results  - If you develop worsening Dizziness, Fatigue, Weakness, or nearly Pass out, or abdominal pain that does not get better, again have heavier bleeding, passing tissue or large clots, or any other persistent worsening symptoms (severe HA not improving with Tylenol, continued nausea / vomiting, not able to keep down liquids) it is important that you seek immediate care. Go to [facility] for evaluation (at any time/day of week)  Please schedule a follow-up appointment with [facility] ([facility]) in 2 weeks for weight check and routine OB  If you have any other questions or concerns, please feel free to call the clinic to contact me. You may also schedule an earlier appointment if necessary.  However, if your symptoms get significantly worse, please go to the Emergency Department to seek immediate medical  attention.  Saralyn Pilar, DO [facility]  Second Trimester of Pregnancy The second trimester is from week 13 through week 28, months 4 through 6. The second trimester is often a time when you feel your best. Your body has also adjusted to being pregnant, and you begin to feel better physically. Usually, morning sickness has lessened or quit completely, you may have more energy, and you may have an increase in appetite. The second trimester is also a time when the fetus is growing rapidly. At the end of the sixth month, the fetus is about 9 inches long and weighs about 1 pounds. You will likely begin to feel the baby move (quickening) between 18 and 20 weeks of the pregnancy. BODY CHANGES Your body goes through many changes during pregnancy. The changes vary from woman to woman.   Your weight will continue to increase. You will notice your lower abdomen bulging out.  You may begin to get stretch marks on your hips, abdomen, and breasts.  You may develop headaches that can be relieved by medicines approved by your health care provider.  You may urinate more often because the fetus is pressing on your bladder.  You may develop or continue to have heartburn as a result of your pregnancy.  You may develop constipation because certain hormones are causing the muscles that push waste through your intestines to slow down.  You may develop hemorrhoids or swollen, bulging veins (varicose veins).  You may have back pain because of the weight gain and pregnancy hormones relaxing your joints between the bones in your pelvis and as a result of a shift in weight and the muscles that support your balance.  Your breasts will continue to grow and be tender.  Your gums may bleed and may be sensitive to brushing and flossing.  Dark spots or blotches (chloasma, mask of pregnancy) may develop on your face. This will likely fade after the baby is born.  A dark line from your belly  button to the pubic area (linea nigra) may appear. This will likely fade after the baby is born.  You may have changes in your hair. These can include thickening of your hair, rapid growth, and changes in texture. Some women also have hair loss during or after pregnancy, or hair that feels dry or thin. Your hair will most likely return to normal after your baby is born. WHAT TO EXPECT AT YOUR PRENATAL VISITS During a routine prenatal visit:  You will be weighed to make sure you and the fetus are growing normally.  Your blood pressure will be taken.  Your abdomen will be measured to track your baby's growth.  The fetal heartbeat will be listened to.  Any test results from the previous visit will be discussed. Your health care provider may ask you:  How you are feeling.  If you are feeling the baby move.  If you have had any abnormal symptoms, such as leaking fluid, bleeding, severe headaches, or abdominal cramping.  If you have any questions. Other tests that may be performed during your second trimester include:  Blood tests that check for:  Low iron levels (anemia).  Gestational diabetes (between 24 and 28 weeks).  Rh antibodies.  Urine tests to check for infections, diabetes, or protein in the urine.  An ultrasound to confirm the proper growth and development of the baby.  An amniocentesis to check for possible genetic problems.  Fetal screens for spina bifida and Down syndrome. HOME CARE INSTRUCTIONS   Avoid all smoking, herbs, alcohol, and unprescribed drugs. These chemicals affect the formation and growth of the baby.  Follow your health care provider's instructions regarding medicine use. There are medicines that are either safe or unsafe to take during pregnancy.  Exercise only as directed by your health care provider. Experiencing uterine cramps is a good sign to stop exercising.  Continue to eat regular, healthy meals.  Wear a good support bra for breast  tenderness.  Do not use hot tubs, steam rooms, or saunas.  Wear your seat belt at all times when driving.  Avoid raw meat, uncooked cheese, cat litter boxes, and soil used by cats. These carry germs that can cause birth defects in the baby.  Take your prenatal vitamins.  Try taking a stool softener (if your health care provider approves) if you develop constipation. Eat more high-fiber foods, such as fresh vegetables or fruit and whole grains. Drink plenty of fluids to keep your urine clear or pale yellow.  Take warm sitz baths to soothe any pain or discomfort caused by hemorrhoids. Use hemorrhoid cream if your health care provider approves.  If you develop varicose veins, wear support hose. Elevate your feet for 15 minutes, 3-4 times a day. Limit salt in your diet.  Avoid heavy lifting, wear low heel shoes, and practice good posture.  Rest with your legs elevated if you have leg cramps or low back pain.  Visit your dentist if you  have not gone yet during your pregnancy. Use a soft toothbrush to brush your teeth and be gentle when you floss.  A sexual relationship may be continued unless your health care provider directs you otherwise.  Continue to go to all your prenatal visits as directed by your health care provider. SEEK MEDICAL CARE IF:   You have dizziness.  You have mild pelvic cramps, pelvic pressure, or nagging pain in the abdominal area.  You have persistent nausea, vomiting, or diarrhea.  You have a bad smelling vaginal discharge.  You have pain with urination. SEEK IMMEDIATE MEDICAL CARE IF:   You have a fever.  You are leaking fluid from your vagina.  You have spotting or bleeding from your vagina.  You have severe abdominal cramping or pain.  You have rapid weight gain or loss.  You have shortness of breath with chest pain.  You notice sudden or extreme swelling of your face, hands, ankles, feet, or legs.  You have not felt your baby move in over an  hour.  You have severe headaches that do not go away with medicine.  You have vision changes. Document Released: 06/01/2001 Document Revised: 06/12/2013 Document Reviewed: 08/08/2012 [facility] Patient Information 2015 [place], [place]. This information is not intended to replace advice given to you by your health care provider. Make sure you discuss any questions you have with your health care provider.

## 2015-03-24 NOTE — Progress Notes (Signed)
History provided by patient in [place], preferred language. Adopt a Mom program.  S: Colleen Daniels is a 24 y.o. [id number] at [redacted]w[redacted]d by estimated LMP (10/02/14).   Acute Concerns Today:  ABDOMINAL / BACK PAIN - Reports continues to have low back pain without significant radiation, worse with position changes, laying flat or on one side. Improves with rest. Previously given Flexeril in 12/2014 at [facility] but did not fill this rx, but would like to try this today. Additionally continues to report episodes of bilateral abdominal pain with variety of cramping and sharp pains lasting hours then resolving, none currently. Previously discussed round ligament pain.pains worse with position changes - Tried Tylenol 500-1000mg  without significant relief, but does not like taking medicines (infrequently taking Tylenol and not more than 1 dose daily)  REDUCED APPETITE / FATIGUE: - Stable, still has episodic periods with these symptoms, primarily fatigue, none actively and feels "well". Reports recent difficulty with reduced appetite mainly due to nausea without vomiting, does not always try to eat meals, sometimes will skip if not hungry or feeling nauseas, tries to eat fruits or certain foods she tolerates now. Tries to stay well hydrated. Not doing any meal supplement. Denies any vomiting, CP, SOB, HA, vision loss, LOC / pre-syncope, focal weakness, dizziness with vertigo.  Routine Prenatal Issues: - Taking PNV daily, Tylenol PRN (infrequent) - Admits nausea often, without vomiting, limiting her PO intake, has to find certain foods or fruits that she can tolerate - Weight loss 6 lbs in 2 weeks (back to pregnancy wt in 12/2014 at 13 weeks, 164 lbs today from 170 lb last visit) - Denies any vision changes, edema, dysuria, constipated, diarrhea  Fetal Movement - regular movement Pain / Contractions - none regular Admits occasional vaginal discharge (taking yeast topical with relief) - declines pelvic  exam Denies any fluid leakage, vaginal bleeding, discharge (note previously had yeast infection on pap, did not take Diflucan but symptoms resolved)  O: BP 107/58 mmHg  Pulse 76  Temp(Src) 98 F (36.7 C)  Wt 164 lb (74.39 kg)  LMP 10/02/2014 (Approximate)  Gen - well-appearing, comfortable, cooperative, NAD Abd - soft, appropriately gravid abdomen for GA, non-tender, no rebound, +active BS, no masses Ext - non-tender, no edema, peripheral pulses intact +2 b/l  A&P: Normal supervision of pregnancy, in 2nd trimester, at [redacted]w[redacted]d by estimated LMP (4/13). - Continue PNV - Received Flu shot and TDap today (03/24/15), at 26wk - Completed routine repeat 1 hr GTT (CBG 65), previous early 1 hr GTT normal - Check routine CBC, HIV, RPR today (26-28 wks)  Reduced Appetite / Fatigue - Likely reduced energy and fatigue with her reduced appetite and evident mild wt loss over past 2 weeks. Seems to be triggered by nausea, otherwise no vomiting, no hyperemesis, not related to illness / fevers, abdominal pain. Currently well appearing and comfortable. Currently inadequate therapy for nausea, only drinking occasional ginger ale. Recommended OTC therapies first with Vitamin B6, Doxylamine, Ginger supplement. Consider future anti-emetics if not responding and still losing wt. Fatigue unlikely from anemia, last Hgb around 12, re-check today on routine CBC. - Recommend continue PNV. Improve hydration. Reassurance, return criteria given  Abdominal Pain, presumed round ligament - No active pain today. Likely round ligament pain in subsequent pregnancy, benign abdomen today. Irregular and not consistent with contractions.  LBP - Chronic during pregnancy, may start taking Tylenol PRN (increase dose to  TID PRN), heating pad. Refilled Flexeril rx advised to take half tab  BID PRN,  titrate up to 10 TID PRN if tolerated. Cautioned on sedation.  RTC [facility] weeks for [facility] - Follow-up weight gain on OTC  anti-nausea treatment, encouraged small frequent meals - Consider ordering repeat US (last 8/19, normal growth, EFW, anatomy, except no fully visualized RVOT, recommend repeat in 6 weeks) - (patient declined at this visit due to lack of insurance coverage)  Saralyn Pilar, DO [facility], PGY-3

## 2015-03-25 DIAGNOSIS — O2610 Low weight gain in pregnancy, unspecified trimester: Secondary | ICD-10-CM | POA: Insufficient documentation

## 2015-03-25 LAB — RPR

## 2015-03-25 LAB — HIV ANTIBODY (ROUTINE TESTING W REFLEX): HIV 1&2 Ab, 4th Generation: NONREACTIVE

## 2015-03-25 NOTE — Progress Notes (Signed)
Office visit encounter opened in error. Please see "OB Prenatal Visit" dated 03/24/15.

## 2015-03-25 NOTE — Assessment & Plan Note (Signed)
Reduced Appetite / Fatigue - Likely reduced energy and fatigue with her reduced appetite and evident mild wt loss over past 2 weeks. Seems to be triggered by nausea, otherwise no vomiting, no hyperemesis, not related to illness / fevers, abdominal pain. Currently well appearing and comfortable. Currently inadequate therapy for nausea, only drinking occasional ginger ale. Recommended OTC therapies first with Vitamin B6, Doxylamine, Ginger supplement. Consider future anti-emetics if not responding and still losing wt. Fatigue unlikely from anemia, last Hgb around 12, re-check today on routine CBC. - Recommend continue PNV. Improve hydration. Reassurance, return criteria given

## 2015-03-25 NOTE — Assessment & Plan Note (Signed)
LBP - Chronic during pregnancy, may start taking Tylenol PRN (increase dose to  TID PRN), heating pad. Refilled Flexeril rx advised to take half tab  BID PRN, titrate up to 10 TID PRN if tolerated. Cautioned on sedation.

## 2015-03-31 NOTE — Progress Notes (Signed)
Lab was not collected. I am cancelling order 

## 2015-03-31 NOTE — Progress Notes (Signed)
Cancelling order as lab not collected, no need to re-order 

## 2015-04-25 ENCOUNTER — Ambulatory Visit (INDEPENDENT_AMBULATORY_CARE_PROVIDER_SITE_OTHER): Payer: Self-pay | Admitting: Family Medicine

## 2015-04-25 VITALS — BP 99/59 | HR 75 | Temp 97.8°F | Wt 179.8 lb

## 2015-04-25 DIAGNOSIS — B379 Candidiasis, unspecified: Secondary | ICD-10-CM

## 2015-04-25 DIAGNOSIS — Z3483 Encounter for supervision of other normal pregnancy, third trimester: Secondary | ICD-10-CM

## 2015-04-25 MED ORDER — FLUCONAZOLE 150 MG PO TABS: ORAL_TABLET | ORAL | Status: DC

## 2015-04-25 NOTE — Assessment & Plan Note (Signed)
Consistent clinically, diagnosed 01/2015, never tried with diflucan (did not fill), improved on OTC now returned  Plan: 1. Start Diflucan 150mg  PO x 1 tab, repeat dose on Day 3 if persistent symptoms

## 2015-04-25 NOTE — Patient Instructions (Signed)
Dear Colleen Daniels, Thank you for coming in to clinic today  1. Overall it sounds like you and baby are doing well and are healthy  2. Weight gain improved, safe weight 179 lbs now (previously 170 about 2 months ago)  Keep eating small frequent meals, increase protein - Take Vitamin B6 as needed for nasuea  For yeast infection take Diflucan - If not improved in 5 to 7 days, please return for Pelvic Exam to test  3. Stay well hydrated! Keep drinking extra water daily - I think you are having some early contractions and possibly Braxton hicks contractions - Most important thing if contractions getting more frequently or more intense (less than 10 min apart) or leakage of fluid  Please go directly to [facility] ([facility]) if: (also make sure that you tell them you are a Family Medicine Patient)  You begin to have strong, frequent contractions  Your water breaks. Sometimes it is a big gush of fluid, sometimes it is just a trickle that keeps getting your panties wet or running down your legs  You have vaginal bleeding. It is normal to have a small amount of spotting if your cervix was checked.   You don't feel your baby moving like normal. If you don't, get you something to eat and drink and lay down and focus on feeling your baby move. You should feel at least 10 movements in 2 hours. If you don't, you should call the office or go to [facility].  4. For back pain may start Tylenol Extra Str  tabs take 2 every 6-8 hours, or 3 times a day (max 6 tabs in 24 hours), take this regularly for 1-2 weeks for better relief. Can pick up flexeril from pharmacy if you want to try this for your back, Also relative rest and heating pad will help - Refilled Flexeril - take half tab as needed for back pain, if not helping can take 1 whole tab,  May cause sedation or drowsy  Please schedule a follow-up appointment with [facility] (Faculty Doctor) in 2 weeks - (Dr.  Pollie Meyer)  If you have any other questions or concerns, please feel free to call the clinic to contact me. You may also schedule an earlier appointment if necessary.  However, if your symptoms get significantly worse, please go to the Emergency Department to seek immediate medical attention.  Saralyn Pilar, DO [facility]  Third Trimester of Pregnancy The third trimester is from week 29 through week 42, months 7 through 9. The third trimester is a time when the fetus is growing rapidly. At the end of the ninth month, the fetus is about 20 inches in length and weighs 6-10 pounds.  BODY CHANGES Your body goes through many changes during pregnancy. The changes vary from woman to woman.   Your weight will continue to increase. You can expect to gain 25-35 pounds (11-16 kg) by the end of the pregnancy.  You may begin to get stretch marks on your hips, abdomen, and breasts.  You may urinate more often because the fetus is moving lower into your pelvis and pressing on your bladder.  You may develop or continue to have heartburn as a result of your pregnancy.  You may develop constipation because certain hormones are causing the muscles that push waste through your intestines to slow down.  You may develop hemorrhoids or swollen, bulging veins (varicose veins).  You may have pelvic pain because of the  weight gain and pregnancy hormones relaxing your joints between the bones in your pelvis. Backaches may result from overexertion of the muscles supporting your posture.  You may have changes in your hair. These can include thickening of your hair, rapid growth, and changes in texture. Some women also have hair loss during or after pregnancy, or hair that feels dry or thin. Your hair will most likely return to normal after your baby is born.  Your breasts will continue to grow and be tender. A yellow discharge may leak from your breasts called colostrum.  Your belly button  may stick out.  You may feel short of breath because of your expanding uterus.  You may notice the fetus "dropping," or moving lower in your abdomen.  You may have a bloody mucus discharge. This usually occurs a few days to a week before labor begins.  Your cervix becomes thin and soft (effaced) near your due date. WHAT TO EXPECT AT YOUR PRENATAL EXAMS  You will have prenatal exams every 2 weeks until week 36. Then, you will have weekly prenatal exams. During a routine prenatal visit:  You will be weighed to make sure you and the fetus are growing normally.  Your blood pressure is taken.  Your abdomen will be measured to track your baby's growth.  The fetal heartbeat will be listened to.  Any test results from the previous visit will be discussed.  You may have a cervical check near your due date to see if you have effaced. At around 36 weeks, your caregiver will check your cervix. At the same time, your caregiver will also perform a test on the secretions of the vaginal tissue. This test is to determine if a type of bacteria, Group B streptococcus, is present. Your caregiver will explain this further. Your caregiver may ask you:  What your birth plan is.  How you are feeling.  If you are feeling the baby move.  If you have had any abnormal symptoms, such as leaking fluid, bleeding, severe headaches, or abdominal cramping.  If you are using any tobacco products, including cigarettes, chewing tobacco, and electronic cigarettes.  If you have any questions. Other tests or screenings that may be performed during your third trimester include:  Blood tests that check for low iron levels (anemia).  Fetal testing to check the health, activity level, and growth of the fetus. Testing is done if you have certain medical conditions or if there are problems during the pregnancy.  HIV (human immunodeficiency virus) testing. If you are at high risk, you may be screened for HIV during your  third trimester of pregnancy. FALSE LABOR You may feel small, irregular contractions that eventually go away. These are called Braxton Hicks contractions, or false labor. Contractions may last for hours, days, or even weeks before true labor sets in. If contractions come at regular intervals, intensify, or become painful, it is best to be seen by your caregiver.  SIGNS OF LABOR   Menstrual-like cramps.  Contractions that are 5 minutes apart or less.  Contractions that start on the top of the uterus and spread down to the lower abdomen and back.  A sense of increased pelvic pressure or back pain.  A watery or bloody mucus discharge that comes from the vagina. If you have any of these signs before the 37th week of pregnancy, call your caregiver right away. You need to go to the hospital to get checked immediately. HOME CARE INSTRUCTIONS   Avoid all smoking,  herbs, alcohol, and unprescribed drugs. These chemicals affect the formation and growth of the baby.  Do not use any tobacco products, including cigarettes, chewing tobacco, and electronic cigarettes. If you need help quitting, ask your health care provider. You may receive counseling support and other resources to help you quit.  Follow your caregiver's instructions regarding medicine use. There are medicines that are either safe or unsafe to take during pregnancy.  Exercise only as directed by your caregiver. Experiencing uterine cramps is a good sign to stop exercising.  Continue to eat regular, healthy meals.  Wear a good support bra for breast tenderness.  Do not use hot tubs, steam rooms, or saunas.  Wear your seat belt at all times when driving.  Avoid raw meat, uncooked cheese, cat litter boxes, and soil used by cats. These carry germs that can cause birth defects in the baby.  Take your prenatal vitamins.  Take 1500-2000 mg of calcium daily starting at the 20th week of pregnancy until you deliver your baby.  Try  taking a stool softener (if your caregiver approves) if you develop constipation. Eat more high-fiber foods, such as fresh vegetables or fruit and whole grains. Drink plenty of fluids to keep your urine clear or pale yellow.  Take warm sitz baths to soothe any pain or discomfort caused by hemorrhoids. Use hemorrhoid cream if your caregiver approves.  If you develop varicose veins, wear support hose. Elevate your feet for 15 minutes, 3-4 times a day. Limit salt in your diet.  Avoid heavy lifting, wear low heal shoes, and practice good posture.  Rest a lot with your legs elevated if you have leg cramps or low back pain.  Visit your dentist if you have not gone during your pregnancy. Use a soft toothbrush to brush your teeth and be gentle when you floss.  A sexual relationship may be continued unless your caregiver directs you otherwise.  Do not travel far distances unless it is absolutely necessary and only with the approval of your caregiver.  Take prenatal classes to understand, practice, and ask questions about the labor and delivery.  Make a trial run to the hospital.  Pack your hospital bag.  Prepare the baby's nursery.  Continue to go to all your prenatal visits as directed by your caregiver. SEEK MEDICAL CARE IF:  You are unsure if you are in labor or if your water has broken.  You have dizziness.  You have mild pelvic cramps, pelvic pressure, or nagging pain in your abdominal area.  You have persistent nausea, vomiting, or diarrhea.  You have a bad smelling vaginal discharge.  You have pain with urination. SEEK IMMEDIATE MEDICAL CARE IF:   You have a fever.  You are leaking fluid from your vagina.  You have spotting or bleeding from your vagina.  You have severe abdominal cramping or pain.  You have rapid weight loss or gain.  You have shortness of breath with chest pain.  You notice sudden or extreme swelling of your face, hands, ankles, feet, or  legs.  You have not felt your baby move in over an hour.  You have severe headaches that do not go away with medicine.  You have vision changes.   This information is not intended to replace advice given to you by your health care provider. Make sure you discuss any questions you have with your health care provider.   Document Released: 06/01/2001 Document Revised: 06/28/2014 Document Reviewed: 08/08/2012 Elsevier Interactive Patient Education 2016 Elsevier  Inc.  

## 2015-04-25 NOTE — Assessment & Plan Note (Signed)
See vitals and notes, 04/25/15. [facility] 2 weeks

## 2015-04-25 NOTE — Progress Notes (Signed)
History provided by patient in [place], preferred language. [employer] a Mom program.  S: Colleen Daniels is a [age] y.o. [id number] at 4943w3d by estimated LMP (10/02/14).   Routine Prenatal Issues: - Taking PNV daily, Tylenol PRN (infrequent) - Admits nausea without vomiting, now improved on vitamin B6, improved PO intake with regular meals x 3 daily - Admits back pain, stable, improved on flexeril taking 5mg  at night and tylenol - Weight gain now up 9 lbs in 2 months to 179 from 170, previously had lower weight - Denies any vision changes, edema, dysuria, constipated, diarrhea  Fetal Movement - regular movement Pain / Contractions - Describes lower abdominal pelvic pressure cramping most days, lasting 1 min per episode returns within 15-20min then will stop happens about 1-3 x daily, irregular. Prior history of early contractions, previously both other pregnancies 37 wk vaginal deliveries, never required treatment for PTL. Admits occasional vaginal discharge still present with vaginal itching, tried OTC yeast cream not improved, never took Diflucan. Denies any fluid leakage, vaginal bleeding  O: BP 99/59 mmHg  Pulse 75  Temp(Src) 97.8 F (36.6 C)  Wt 179 lb 12.8 oz (81.557 kg)  LMP 10/02/2014 (Approximate)  Gen - well-appearing, comfortable, cooperative, NAD Abd - soft, appropriately gravid abdomen for GA, mild tenderness central lower abdomen, no rebound, +active BS, no masses Ext - non-tender, no edema, peripheral pulses intact +2 b/l  A&P: Normal supervision of pregnancy, in 3rd trimester, at 7543w3d by estimated LMP (4/13). - Continue PNV - UTD on Flu shot, TDap  Reduced Appetite - Significantly Improved - Now taking Vitamin B6, eating 3+ regular meals daily, weight gain normal, mild nausea occasional without vomiting. No further concerns.  Abdominal Pain/Pressure, concern for early contractions vs braxton hicks - No active pain today. Previously consistent with round ligament  pain, now more pressure and history consistent with early contractions, lasting 1 min every 15-20 min about 1-3x daily, irregular, no leakage of fluids or other symptoms. Previous pregnancy had some early contractions, never required treatment. Both 2 prior pregnancies full term 37 weeks. - Counseled on fetal kick counts, pre-term labor precautions, continue good hydration, reviewed criteria when to go to [facility]U (inc frequency < 10 min, inc intensity, fluid leakage)  LBP - Improved - Chronic during pregnancy, on Tylenol and Flexeril 5mg  at night, not due for refill. Advised can take during day if needed for pain, also may inc to full dose 10mg , however does make her sleepy.  Yeast vaginitis - Diagnosed on wet prep in 01/2015, never filled Diflucan rx. Still with thick discharge and vaginal itching. Repeat empiric treatment today, refilled Diflucan 150mg  today and 2nd dose on Day 3. If not resolved, return for wet prep and further eval.  RTC 2 weeks for [facility] - Follow-up early irregular contractions vs braxton hicks, counseled to follow-up at [facility] if more frequent or inc intensity - Consider ordering repeat US (last 8/19, normal growth, EFW, anatomy, except no fully visualized RVOT, recommend repeat in 6 weeks)  Saralyn PilarAlexander Karamalegos, DO [facility], PGY-3

## 2015-05-08 ENCOUNTER — Ambulatory Visit (INDEPENDENT_AMBULATORY_CARE_PROVIDER_SITE_OTHER): Payer: Self-pay | Admitting: Family Medicine

## 2015-05-08 VITALS — BP 103/61 | HR 105 | Temp 98.0°F | Wt 178.4 lb

## 2015-05-08 DIAGNOSIS — Z3493 Encounter for supervision of normal pregnancy, unspecified, third trimester: Secondary | ICD-10-CM

## 2015-05-08 NOTE — Patient Instructions (Signed)

## 2015-05-08 NOTE — Progress Notes (Signed)
Colleen Daniels is a 24yo female presenting at 32 weeks and 2 days for prenatal visit. Notes Braxton Hicks contractions occasionally at night, lasting less than one hour and spaced approximately five minutes apart before resolving. Denies nausea, vomiting, vaginal bleeding, vaginal discharge, loss of fluid. Notes some loss of urine when baby moves lower in abdomen. Continues to note fetal movement. Up to date on lab work. Up to date on immunizations. Anatomy ultrasound with incomplete views, discussed importance of follow up ultrasound which she has previously declined. Agrees to go for repeat ultrasound and scheduled for 11/28. Appeared breeched at bedside ultrasound today. Also notes pressure in abdomen, improved with superior pressure. Recommend prenatal cradle to help with pressure. No further concerns today. Follow up in 2 weeks.

## 2015-05-19 ENCOUNTER — Ambulatory Visit (HOSPITAL_COMMUNITY)
Admission: RE | Admit: 2015-05-19 | Discharge: 2015-05-19 | Disposition: A | Discharge status: Home / Self Care | Payer: Self-pay | Source: Ambulatory Visit | Attending: Family Medicine | Admitting: Family Medicine

## 2015-05-19 DIAGNOSIS — Z36 Encounter for antenatal screening of mother: Secondary | ICD-10-CM | POA: Insufficient documentation

## 2015-05-19 DIAGNOSIS — Z3493 Encounter for supervision of normal pregnancy, unspecified, third trimester: Secondary | ICD-10-CM

## 2015-05-19 DIAGNOSIS — Z3A33 33 weeks gestation of pregnancy: Secondary | ICD-10-CM | POA: Insufficient documentation

## 2015-05-21 ENCOUNTER — Encounter: Payer: Self-pay | Admitting: Internal Medicine

## 2015-05-23 ENCOUNTER — Ambulatory Visit (INDEPENDENT_AMBULATORY_CARE_PROVIDER_SITE_OTHER): Payer: Self-pay | Admitting: Family Medicine

## 2015-05-23 ENCOUNTER — Encounter: Payer: Self-pay | Admitting: Family Medicine

## 2015-05-23 VITALS — BP 103/54 | HR 96 | Temp 98.3°F | Wt 184.3 lb

## 2015-05-23 DIAGNOSIS — O26813 Pregnancy related exhaustion and fatigue, third trimester: Secondary | ICD-10-CM

## 2015-05-23 DIAGNOSIS — Z3483 Encounter for supervision of other normal pregnancy, third trimester: Secondary | ICD-10-CM

## 2015-05-23 LAB — CBC
HCT: 34.8 % — ABNORMAL LOW (ref 36.0–46.0)
Hemoglobin: 11.7 g/dL — ABNORMAL LOW (ref 12.0–15.0)
MCH: 28.7 pg (ref 26.0–34.0)
MCHC: 33.6 g/dL (ref 30.0–36.0)
MCV: 85.3 fL (ref 78.0–100.0)
MPV: 10.6 fL (ref 8.6–12.4)
PLATELETS: 257 10*3/uL (ref 150–400)
RBC: 4.08 MIL/uL (ref 3.87–5.11)
RDW: 13.4 % (ref 11.5–15.5)
WBC: 10 10*3/uL (ref 4.0–10.5)

## 2015-05-23 NOTE — Patient Instructions (Signed)
Checking blood counts today  If you have any contractions which occur 7-10 minutes apart, vaginal bleeding, fluid leaking, or are worried that baby is not moving well, go immediately to [facility] to be evaluated.   Next visit in 2 weeks.  Be well, Dr. Pollie Meyer   Third Trimester of Pregnancy The third trimester is from week 29 through week 42, months 7 through 9. The third trimester is a time when the fetus is growing rapidly. At the end of the ninth month, the fetus is about 20 inches in length and weighs 6-10 pounds.  BODY CHANGES Your body goes through many changes during pregnancy. The changes vary from woman to woman.   Your weight will continue to increase. You can expect to gain 25-35 pounds (11-16 kg) by the end of the pregnancy.  You may begin to get stretch marks on your hips, abdomen, and breasts.  You may urinate more often because the fetus is moving lower into your pelvis and pressing on your bladder.  You may develop or continue to have heartburn as a result of your pregnancy.  You may develop constipation because certain hormones are causing the muscles that push waste through your intestines to slow down.  You may develop hemorrhoids or swollen, bulging veins (varicose veins).  You may have pelvic pain because of the weight gain and pregnancy hormones relaxing your joints between the bones in your pelvis. Backaches may result from overexertion of the muscles supporting your posture.  You may have changes in your hair. These can include thickening of your hair, rapid growth, and changes in texture. Some women also have hair loss during or after pregnancy, or hair that feels dry or thin. Your hair will most likely return to normal after your baby is born.  Your breasts will continue to grow and be tender. A yellow discharge may leak from your breasts called colostrum.  Your belly button may stick out.  You may feel short of breath because of your expanding  uterus.  You may notice the fetus "dropping," or moving lower in your abdomen.  You may have a bloody mucus discharge. This usually occurs a few days to a week before labor begins.  Your cervix becomes thin and soft (effaced) near your due date. WHAT TO EXPECT AT YOUR PRENATAL EXAMS  You will have prenatal exams every 2 weeks until week 36. Then, you will have weekly prenatal exams. During a routine prenatal visit:  You will be weighed to make sure you and the fetus are growing normally.  Your blood pressure is taken.  Your abdomen will be measured to track your baby's growth.  The fetal heartbeat will be listened to.  Any test results from the previous visit will be discussed.  You may have a cervical check near your due date to see if you have effaced. At around 36 weeks, your caregiver will check your cervix. At the same time, your caregiver will also perform a test on the secretions of the vaginal tissue. This test is to determine if a type of bacteria, Group B streptococcus, is present. Your caregiver will explain this further. Your caregiver may ask you:  What your birth plan is.  How you are feeling.  If you are feeling the baby move.  If you have had any abnormal symptoms, such as leaking fluid, bleeding, severe headaches, or abdominal cramping.  If you are using any tobacco products, including cigarettes, chewing tobacco, and electronic cigarettes.  If you have  any questions. Other tests or screenings that may be performed during your third trimester include:  Blood tests that check for low iron levels (anemia).  Fetal testing to check the health, activity level, and growth of the fetus. Testing is done if you have certain medical conditions or if there are problems during the pregnancy.  HIV (human immunodeficiency virus) testing. If you are at high risk, you may be screened for HIV during your third trimester of pregnancy. FALSE LABOR You may feel small,  irregular contractions that eventually go away. These are called Braxton Hicks contractions, or false labor. Contractions may last for hours, days, or even weeks before true labor sets in. If contractions come at regular intervals, intensify, or become painful, it is best to be seen by your caregiver.  SIGNS OF LABOR   Menstrual-like cramps.  Contractions that are 5 minutes apart or less.  Contractions that start on the top of the uterus and spread down to the lower abdomen and back.  A sense of increased pelvic pressure or back pain.  A watery or bloody mucus discharge that comes from the vagina. If you have any of these signs before the 37th week of pregnancy, call your caregiver right away. You need to go to the hospital to get checked immediately. HOME CARE INSTRUCTIONS   Avoid all smoking, herbs, alcohol, and unprescribed drugs. These chemicals affect the formation and growth of the baby.  Do not use any tobacco products, including cigarettes, chewing tobacco, and electronic cigarettes. If you need help quitting, ask your health care provider. You may receive counseling support and other resources to help you quit.  Follow your caregiver's instructions regarding medicine use. There are medicines that are either safe or unsafe to take during pregnancy.  Exercise only as directed by your caregiver. Experiencing uterine cramps is a good sign to stop exercising.  Continue to eat regular, healthy meals.  Wear a good support bra for breast tenderness.  Do not use hot tubs, steam rooms, or saunas.  Wear your seat belt at all times when driving.  Avoid raw meat, uncooked cheese, cat litter boxes, and soil used by cats. These carry germs that can cause birth defects in the baby.  Take your prenatal vitamins.  Take 1500-2000 mg of calcium daily starting at the 20th week of pregnancy until you deliver your baby.  Try taking a stool softener (if your caregiver approves) if you develop  constipation. Eat more high-fiber foods, such as fresh vegetables or fruit and whole grains. Drink plenty of fluids to keep your urine clear or pale yellow.  Take warm sitz baths to soothe any pain or discomfort caused by hemorrhoids. Use hemorrhoid cream if your caregiver approves.  If you develop varicose veins, wear support hose. Elevate your feet for 15 minutes, 3-4 times a day. Limit salt in your diet.  Avoid heavy lifting, wear low heal shoes, and practice good posture.  Rest a lot with your legs elevated if you have leg cramps or low back pain.  Visit your dentist if you have not gone during your pregnancy. Use a soft toothbrush to brush your teeth and be gentle when you floss.  A sexual relationship may be continued unless your caregiver directs you otherwise.  Do not travel far distances unless it is absolutely necessary and only with the approval of your caregiver.  Take prenatal classes to understand, practice, and ask questions about the labor and delivery.  Make a trial run to the hospital.  Pack your hospital bag.  Prepare the baby's nursery.  Continue to go to all your prenatal visits as directed by your caregiver. SEEK MEDICAL CARE IF:  You are unsure if you are in labor or if your water has broken.  You have dizziness.  You have mild pelvic cramps, pelvic pressure, or nagging pain in your abdominal area.  You have persistent nausea, vomiting, or diarrhea.  You have a bad smelling vaginal discharge.  You have pain with urination. SEEK IMMEDIATE MEDICAL CARE IF:   You have a fever.  You are leaking fluid from your vagina.  You have spotting or bleeding from your vagina.  You have severe abdominal cramping or pain.  You have rapid weight loss or gain.  You have shortness of breath with chest pain.  You notice sudden or extreme swelling of your face, hands, ankles, feet, or legs.  You have not felt your baby move in over an hour.  You have severe  headaches that do not go away with medicine.  You have vision changes.   This information is not intended to replace advice given to you by your health care provider. Make sure you discuss any questions you have with your health care provider.   Document Released: 06/01/2001 Document Revised: 06/28/2014 Document Reviewed: 08/08/2012 Elsevier Interactive Patient Education [employer]

## 2015-05-25 NOTE — Progress Notes (Signed)
Colleen Daniels is a 24 y.o. G3P2002 at 5648w3d here for routine follow up.  She reports she's doing well. Denies fluid leaking, vaginal bleeding, or decreased fetal movement. Has contractions for about 1 hour total per day, about every 5-8 minutes, then they stop. Otherwise no contractions. Endorses pelvic pressure and back pain. Taking tylenol and flexeril without much help. Also feels lightheaded occasionally. Eats 3-4 times per day. No passing out. Drinks lots of water. Sometimes short of breath. See flow sheet for details.  Objective: NAD, pleasant, cooperative Lungs clear bilaterally. Heart regular rate and rhythm without murmur.  Abdomen: gravid but nontender.  A/P: Pregnancy at 4948w3d.  Doing well.   Repeat U/s reviewed - anatomy survey now complete.   -for lightheadedness- encouraged even more water intake, with frequent snacks. Counseled patient that many of the symptoms that she complains of are normal in pregnancy. Patient well appearing today. Due to report of more fatigue, will check CBC to ensure no anemia.  Preterm labor and fetal movement precautions reviewed. Follow up 2 weeks - needs GBS/gc/chlamydia at that visit.

## 2015-06-09 ENCOUNTER — Other Ambulatory Visit (HOSPITAL_COMMUNITY): Admission: RE | Admit: 2015-06-09 | Payer: Self-pay | Source: Ambulatory Visit | Admitting: Family Medicine

## 2015-06-09 ENCOUNTER — Ambulatory Visit (INDEPENDENT_AMBULATORY_CARE_PROVIDER_SITE_OTHER): Payer: Self-pay | Admitting: Family Medicine

## 2015-06-09 VITALS — BP 107/59 | HR 75 | Temp 97.8°F | Wt 185.0 lb

## 2015-06-09 DIAGNOSIS — Z3483 Encounter for supervision of other normal pregnancy, third trimester: Secondary | ICD-10-CM

## 2015-06-09 NOTE — Patient Instructions (Signed)

## 2015-06-09 NOTE — Progress Notes (Signed)
Colleen Daniels is a 24 y.o. G3P2002 at 6024w6d here for routine follow up.  She reports contractions 5 minutes apart less than 1 hour per day, difficulty sleeping because of pelvic pain when turning in bed.  Denies vaginal discharge, bleeding.  Good fetal movement.   See flow sheet for details.  A/P: Pregnancy at 4924w6d.  Doing well.    Infant feeding choice: breastfeeding Contraception choice: Nuvaring Infant circumcision desired: yes  Tdap was not given today. Patient reports she received this earlier in pregnancy GBS and gc/chlamydia testing was performed today.  Preterm labor and fetal movement precautions reviewed. Safe sleep discussed. Follow up 1 week(s).

## 2015-06-10 LAB — CERVICOVAGINAL ANCILLARY ONLY
Chlamydia: NEGATIVE
NEISSERIA GONORRHEA: NEGATIVE

## 2015-06-10 LAB — STREP B DNA PROBE: STREP GROUP B AG: NOT DETECTED

## 2015-06-12 ENCOUNTER — Encounter: Payer: Self-pay | Admitting: *Deleted

## 2015-06-15 ENCOUNTER — Encounter (HOSPITAL_COMMUNITY): Payer: Self-pay | Admitting: *Deleted

## 2015-06-15 ENCOUNTER — Inpatient Hospital Stay (HOSPITAL_COMMUNITY)
Admission: AD | Admit: 2015-06-15 | Discharge: 2015-06-16 | DRG: 775 | Disposition: A | Discharge status: Home / Self Care | Payer: Medicaid Other | Source: Intra-hospital | Attending: Family Medicine | Admitting: Family Medicine

## 2015-06-15 DIAGNOSIS — O4292 Full-term premature rupture of membranes, unspecified as to length of time between rupture and onset of labor: Principal | ICD-10-CM | POA: Diagnosis present

## 2015-06-15 DIAGNOSIS — Z8249 Family history of ischemic heart disease and other diseases of the circulatory system: Secondary | ICD-10-CM | POA: Diagnosis not present

## 2015-06-15 DIAGNOSIS — Z833 Family history of diabetes mellitus: Secondary | ICD-10-CM | POA: Diagnosis not present

## 2015-06-15 DIAGNOSIS — IMO0001 Reserved for inherently not codable concepts without codable children: Secondary | ICD-10-CM

## 2015-06-15 DIAGNOSIS — Z3480 Encounter for supervision of other normal pregnancy, unspecified trimester: Secondary | ICD-10-CM

## 2015-06-15 DIAGNOSIS — O4290 Premature rupture of membranes, unspecified as to length of time between rupture and onset of labor, unspecified weeks of gestation: Secondary | ICD-10-CM

## 2015-06-15 LAB — TYPE AND SCREEN
ABO/RH(D): A POS
ANTIBODY SCREEN: NEGATIVE

## 2015-06-15 LAB — RPR: RPR Ser Ql: NONREACTIVE

## 2015-06-15 LAB — CBC
HEMATOCRIT: 35.6 % — AB (ref 36.0–46.0)
HEMOGLOBIN: 12.3 g/dL (ref 12.0–15.0)
MCH: 29.4 pg (ref 26.0–34.0)
MCHC: 34.6 g/dL (ref 30.0–36.0)
MCV: 85.2 fL (ref 78.0–100.0)
Platelets: 257 10*3/uL (ref 150–400)
RBC: 4.18 MIL/uL (ref 3.87–5.11)
RDW: 13.7 % (ref 11.5–15.5)
WBC: 9.2 10*3/uL (ref 4.0–10.5)

## 2015-06-15 LAB — POCT FERN TEST: POCT FERN TEST: POSITIVE

## 2015-06-15 LAB — HIV ANTIBODY (ROUTINE TESTING W REFLEX): HIV SCREEN 4TH GENERATION: NONREACTIVE

## 2015-06-15 MED ORDER — LACTATED RINGERS IV SOLN: INTRAVENOUS | Status: DC

## 2015-06-15 MED ORDER — ACETAMINOPHEN 325 MG PO TABS: 650.0000 mg | ORAL_TABLET | ORAL | Status: DC | PRN

## 2015-06-15 MED ORDER — LACTATED RINGERS IV SOLN
500.0000 mL | INTRAVENOUS | Status: DC | PRN
  Administered 2015-06-15: 500 mL via INTRAVENOUS

## 2015-06-15 MED ORDER — ONDANSETRON HCL 4 MG/2ML IJ SOLN: 4.0000 mg | Freq: Four times a day (QID) | INTRAMUSCULAR | Status: DC | PRN

## 2015-06-15 MED ORDER — OXYCODONE-ACETAMINOPHEN 5-325 MG PO TABS: 2.0000 | ORAL_TABLET | ORAL | Status: DC | PRN

## 2015-06-15 MED ORDER — ONDANSETRON HCL 4 MG/2ML IJ SOLN: 4.0000 mg | INTRAMUSCULAR | Status: DC | PRN

## 2015-06-15 MED ORDER — OXYTOCIN 40 UNITS IN LACTATED RINGERS INFUSION - SIMPLE MED
1.0000 m[IU]/min | INTRAVENOUS | Status: DC
  Administered 2015-06-15: 2 m[IU]/min via INTRAVENOUS

## 2015-06-15 MED ORDER — SIMETHICONE 80 MG PO CHEW: 80.0000 mg | CHEWABLE_TABLET | ORAL | Status: DC | PRN

## 2015-06-15 MED ORDER — BENZOCAINE-MENTHOL 20-0.5 % EX AERO
1.0000 | INHALATION_SPRAY | CUTANEOUS | Status: DC | PRN
Start: 2015-06-15 — End: 2015-06-16
  Administered 2015-06-15: 1 via TOPICAL
  Filled 2015-06-15: qty 56

## 2015-06-15 MED ORDER — LACTATED RINGERS IV SOLN: 500.0000 mL | INTRAVENOUS | Status: DC | PRN

## 2015-06-15 MED ORDER — LANOLIN HYDROUS EX OINT: TOPICAL_OINTMENT | CUTANEOUS | Status: DC | PRN

## 2015-06-15 MED ORDER — DIBUCAINE 1 % RE OINT
1.0000 | TOPICAL_OINTMENT | RECTAL | Status: DC | PRN
Start: 2015-06-15 — End: 2015-06-16

## 2015-06-15 MED ORDER — TETANUS-DIPHTH-ACELL PERTUSSIS 5-2.5-18.5 LF-MCG/0.5 IM SUSP: 0.5000 mL | Freq: Once | INTRAMUSCULAR | Status: DC

## 2015-06-15 MED ORDER — OXYTOCIN 40 UNITS IN LACTATED RINGERS INFUSION - SIMPLE MED: 62.5000 mL/h | INTRAVENOUS | Status: DC

## 2015-06-15 MED ORDER — OXYCODONE-ACETAMINOPHEN 5-325 MG PO TABS
1.0000 | ORAL_TABLET | ORAL | Status: DC | PRN
Start: 2015-06-15 — End: 2015-06-15

## 2015-06-15 MED ORDER — OXYTOCIN BOLUS FROM INFUSION
500.0000 mL | INTRAVENOUS | Status: DC
  Administered 2015-06-15: 500 mL via INTRAVENOUS

## 2015-06-15 MED ORDER — FLEET ENEMA 7-19 GM/118ML RE ENEM: 1.0000 | ENEMA | RECTAL | Status: DC | PRN

## 2015-06-15 MED ORDER — ZOLPIDEM TARTRATE 5 MG PO TABS: 5.0000 mg | ORAL_TABLET | Freq: Every evening | ORAL | Status: DC | PRN

## 2015-06-15 MED ORDER — OXYTOCIN BOLUS FROM INFUSION: 500.0000 mL | INTRAVENOUS | Status: DC

## 2015-06-15 MED ORDER — CITRIC ACID-SODIUM CITRATE 334-500 MG/5ML PO SOLN: 30.0000 mL | ORAL | Status: DC | PRN

## 2015-06-15 MED ORDER — IBUPROFEN 600 MG PO TABS
600.0000 mg | ORAL_TABLET | Freq: Four times a day (QID) | ORAL | Status: DC
  Administered 2015-06-15 – 2015-06-16 (×4): 600 mg via ORAL
  Filled 2015-06-15 (×4): qty 1

## 2015-06-15 MED ORDER — FENTANYL CITRATE (PF) 100 MCG/2ML IJ SOLN
100.0000 ug | INTRAMUSCULAR | Status: DC | PRN
  Administered 2015-06-15: 100 ug via INTRAVENOUS
  Filled 2015-06-15: qty 2

## 2015-06-15 MED ORDER — LACTATED RINGERS IV SOLN
INTRAVENOUS | Status: DC
Start: 2015-06-15 — End: 2015-06-15
  Administered 2015-06-15 (×2): via INTRAVENOUS

## 2015-06-15 MED ORDER — SENNOSIDES-DOCUSATE SODIUM 8.6-50 MG PO TABS
2.0000 | ORAL_TABLET | ORAL | Status: DC
  Administered 2015-06-15: 2 via ORAL
  Filled 2015-06-15: qty 2

## 2015-06-15 MED ORDER — ONDANSETRON HCL 4 MG PO TABS
4.0000 mg | ORAL_TABLET | ORAL | Status: DC | PRN
Start: 2015-06-15 — End: 2015-06-16

## 2015-06-15 MED ORDER — LIDOCAINE HCL (PF) 1 % IJ SOLN
30.0000 mL | INTRAMUSCULAR | Status: DC | PRN
  Filled 2015-06-15: qty 30

## 2015-06-15 MED ORDER — PRENATAL MULTIVITAMIN CH
1.0000 | ORAL_TABLET | Freq: Every day | ORAL | Status: DC
  Administered 2015-06-16: 1 via ORAL
  Filled 2015-06-15: qty 1

## 2015-06-15 MED ORDER — DIPHENHYDRAMINE HCL 25 MG PO CAPS: 25.0000 mg | ORAL_CAPSULE | Freq: Four times a day (QID) | ORAL | Status: DC | PRN

## 2015-06-15 MED ORDER — FENTANYL 2.5 MCG/ML BUPIVACAINE 1/10 % EPIDURAL INFUSION (WH - ANES): 14.0000 mL/h | INTRAMUSCULAR | Status: DC | PRN

## 2015-06-15 MED ORDER — EPHEDRINE 5 MG/ML INJ
10.0000 mg | INTRAVENOUS | Status: DC | PRN
  Filled 2015-06-15: qty 2

## 2015-06-15 MED ORDER — OXYCODONE-ACETAMINOPHEN 5-325 MG PO TABS: 1.0000 | ORAL_TABLET | ORAL | Status: DC | PRN

## 2015-06-15 MED ORDER — WITCH HAZEL-GLYCERIN EX PADS: 1.0000 "application " | MEDICATED_PAD | CUTANEOUS | Status: DC | PRN

## 2015-06-15 MED ORDER — DIPHENHYDRAMINE HCL 50 MG/ML IJ SOLN: 12.5000 mg | INTRAMUSCULAR | Status: DC | PRN

## 2015-06-15 MED ORDER — OXYTOCIN 40 UNITS IN LACTATED RINGERS INFUSION - SIMPLE MED
62.5000 mL/h | INTRAVENOUS | Status: DC
  Filled 2015-06-15: qty 1000

## 2015-06-15 MED ORDER — PHENYLEPHRINE 40 MCG/ML (10ML) SYRINGE FOR IV PUSH (FOR BLOOD PRESSURE SUPPORT)
80.0000 ug | PREFILLED_SYRINGE | INTRAVENOUS | Status: DC | PRN
  Filled 2015-06-15: qty 2

## 2015-06-15 NOTE — MAU Note (Signed)
Started leaking at 0530, has soaked 3 pads.  No bleeding. Some contractions.  Was 1 cm when last checked

## 2015-06-15 NOTE — H&P (Signed)
Colleen Daniels is a 24 y.o. female presenting for premature rupture of membranes for clear fluid at 0530.  Pt reports intermittent contractions for past 2-3 weeks.  Feels irregular contractions at this time.  Denies vaginal bleeding.  Received prenatal care at [facility], denies any problems with pregnancy.  Pregnancy dated by LMP consistent with 14 week ultrasound.  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  Hepatitis B surface antigen negative July 2016.    Maternal Medical History:  Reason for admission: Rupture of membranes.   Contractions: Onset was more than 2 days ago.   Frequency: irregular.    Fetal activity: Perceived fetal activity is normal.    Prenatal complications: no prenatal complications Prenatal Complications - Diabetes: none.    Patient Active Problem List   Diagnosis Date Noted  . Low back pain during pregnancy in second trimester 03/24/2015  . Fatigue during pregnancy 03/07/2015  . [redacted] weeks gestation of pregnancy   . Abdominal pain affecting pregnancy   . Subchorionic hematoma   . Encounter for fetal anatomic survey   . Abdominal pain in pregnancy 01/01/2015  . Vaginal bleeding in pregnant patient at less than [redacted] weeks gestation 01/01/2015  . Encounter for supervision of other normal pregnancy 09/13/2011  . Yeast infection 09/13/2011   OB History    Gravida Para Term Preterm AB TAB SAB Ectopic Multiple Living   0 0 0 0 0 0 2     Past Medical History  Diagnosis Date  . No pertinent past medical history   . Hepatitis B     as a child   Past Surgical History  Procedure Laterality Date  . No past surgeries     Family History: family history includes Diabetes in her maternal grandmother; Hypertension in her maternal grandmother. Social History:  reports that she has never smoked. She has never used smokeless tobacco. She reports that she does not drink alcohol or use illicit drugs.   Prenatal Transfer Tool  Maternal Diabetes:  No Genetic Screening: Declined Maternal Ultrasounds/Referrals: Normal Fetal Ultrasounds or other Referrals:  None Maternal Substance Abuse:  No Significant Maternal Medications:  None Significant Maternal Lab Results:  Lab values include: Group B Strep negative Other Comments:  Reports Hep B as child; Hepatitis Surface antigen negative July 2016.  Review of Systems  Gastrointestinal: Positive for abdominal pain.  Genitourinary:       Leaking of fluid  All other systems reviewed and are negative.   Dilation: 1.5 Effacement (%): 50 Station: -3 Exam by:: jolynn Blood pressure 108/58, pulse 68, temperature 97.4 F (36.3 C), temperature source Oral, resp. rate 18, height  (1.499 m), weight 84.823 kg (187 lb), last menstrual period 10/02/2014. Maternal Exam:  Uterine Assessment: Contraction strength is moderate.  Contraction frequency is irregular.   Abdomen: Patient reports no abdominal tenderness. Estimated fetal weight is 6.5-7lbs.   Fetal presentation: vertex  Introitus: Vagina is positive for vaginal discharge (mucusy).  Amniotic fluid character: clear.     Physical Exam  Constitutional: She is oriented to person, place, and time. She appears well-developed and well-nourished.  HENT:  Head: Normocephalic.  Neck: Normal range of motion. Neck supple.  Cardiovascular: Normal rate, regular rhythm and normal heart sounds.   Respiratory: Effort normal and breath sounds normal. No respiratory distress.  GI: Soft. There is no tenderness.  Genitourinary: No bleeding in the vagina. Vaginal discharge (mucusy) found.  Musculoskeletal: Normal range of motion. She exhibits no edema.  Neurological: She is alert and oriented to person, place, and time.  Skin: Skin is warm and dry.    Prenatal labs: ABO, Rh: --/--/A POS (12/25 0825) Antibody: NEG (12/25 0825) Rubella: 1.61 (07/06 1436) RPR: NON REAC (10/03 1138)  HBsAg: NEGATIVE (07/06 1436)  HIV: NONREACTIVE (10/03 1138)   GBS: NOT DETECTED (12/19 0857)   Assessment/Plan: 24 y.o. G3P2002 at 2976w5d IUP Premature Rupture of Membranes GBS NOT DETECTED (12/19 0857)  [facility]: [facility] augmentation  Rochele PagesKARIM, WALIDAH N 06/15/2015, 11:07 AM

## 2015-06-15 NOTE — Lactation Note (Signed)
This note was copied from the chart of Colleen Exxon Mobil CorporationMaricruz Malpica Daniels. Lactation Consultation Note Initial visit at 7 hours of age.  Mom reports a few feedings with baby latched now.  Baby has wide flanged latch with rhythmic sucking.  Mom unsure how much baby is getting.  Discussed normal volume and stomach size.  Encouraged exclusively breastfeeding as long as baby is doing well.  Mom is experienced with older child nursing for 3 months. Mom denies pain or concerns.  FOB at bedside supportive.  [facility] given and discussed.  Encouraged to feed with early cues on demand, mom to wake baby every 3 hours as need for feedings due to weight under 6#.  Early newborn behavior discussed.  Hand expression reported by mom  with colostrum visible.  Mom to call for assist as needed.      Patient Name: Colleen Daniels WUJWJ'XToday's Date: 06/15/2015 Reason for consult: Initial assessment   Maternal Data Has patient been taught Hand Expression?: Yes Does the patient have breastfeeding experience prior to this delivery?: Yes  Feeding Feeding Type: Breast Fed Length of feed:  (didnt observe latch on, but observed several  minutes)  LATCH Score/Interventions Latch: Grasps breast easily, tongue down, lips flanged, rhythmical sucking. Intervention(s): Adjust position;Assist with latch;Breast massage;Breast compression  Audible Swallowing: A few with stimulation Intervention(s): Skin to skin;Hand expression;Alternate breast massage  Type of Nipple: Everted at rest and after stimulation  Comfort (Breast/Nipple): Soft / non-tender     Hold (Positioning): No assistance needed to correctly position infant at breast.  LATCH Score: 9  Lactation Tools Discussed/Used     Consult Status Consult Status: Follow-up Date: 06/16/15 Follow-up type: In-patient    Colleen Daniels, Colleen Daniels 06/15/2015, 10:35 PM

## 2015-06-15 NOTE — Progress Notes (Signed)
  Subjective: Pt reports increased pain; requesting pain medication.    Objective: BP 113/65 mmHg  Pulse 68  Temp(Src) 97.8 F (36.6 C) (Oral)  Resp 18  Ht 4\' 11"  (1.499 m)  Wt 84.823 kg (187 lb)  BMI 37.75 kg/m2  LMP 10/02/2014 (Approximate)      FHT:  FHR: 120's bpm, variability: moderate,  accelerations:  Present,  decelerations:  Present intermittent variable decel (<60 sec), return to baseline UC:   regular, every 2-3 minutes SVE:   Dilation: 4 Effacement (%): 60 Station: -2 Exam by:: ConAg[employer] FoodsSmith  Labs: Lab Results  Component Value Date   WBC 9.2 06/15/2015   HGB 12.3 06/15/2015   HCT 35.6* 06/15/2015   MCV 85.2 06/15/2015   PLT 257 06/15/2015    Assessment / Plan: Augmentation of labor, progressing well  Labor: Progressing normally Preeclampsia:  n/a Fetal Wellbeing:  Category II Pain Control:  Fentanyl I/D:  GBS neg Anticipated MOD:  NSVD  KARIM, WALIDAH N 06/15/2015, 1:51 PM

## 2015-06-16 DIAGNOSIS — IMO0001 Reserved for inherently not codable concepts without codable children: Secondary | ICD-10-CM

## 2015-06-16 DIAGNOSIS — O4290 Premature rupture of membranes, unspecified as to length of time between rupture and onset of labor, unspecified weeks of gestation: Secondary | ICD-10-CM

## 2015-06-16 MED ORDER — IBUPROFEN 600 MG PO TABS: 600.0000 mg | ORAL_TABLET | Freq: Four times a day (QID) | ORAL | Status: DC

## 2015-06-16 NOTE — Discharge Instructions (Signed)

## 2015-06-16 NOTE — Progress Notes (Signed)
Discharge education complete, discharge instructions and follow up appointment discussed. Patient verbalized understanding. 

## 2015-06-16 NOTE — Discharge Summary (Signed)
OB Discharge Summary     Patient Name: Colleen Daniels DOB: [birth year] MRN: [id number]  Date of admission: 06/15/2015 Delivering MD: Colleen FalconMUHAMMAD, Colleen Daniels   Date of discharge: 06/16/2015  Admitting diagnosis: 37 wks water broke ctx every 5 min Intrauterine pregnancy: 8524w5d     Secondary diagnosis:  Active Problems:   Encounter for supervision of other normal pregnancy   Status post vaginal delivery   PROM (premature rupture of membranes)  Additional problems: None      Discharge diagnosis: Term Pregnancy Delivered                                                                                                Post partum procedures:None  Augmentation: Pitocin  Complications: None  Hospital course:  Onset of Labor With Vaginal Delivery     24 y.o. yo [id number] at 2224w5d was admitted with PROM on 06/15/2015. Patient was augmented with Pitocin. Patient had an uncomplicated labor course as follows:  Membrane Rupture Time/Date: 5:30 AM ,06/15/2015   Intrapartum Procedures: Episiotomy: None [1]                                         Lacerations:  None [1]  Patient had a delivery of a Viable infant. 06/15/2015  Information for the patient's newborn:  Colleen Daniels, Colleen Daniels [[id number]][[id number]]  Delivery Method: Vaginal, Spontaneous Delivery (Filed from Delivery Summary)   Pateint had an uncomplicated postpartum course.  She is ambulating, tolerating a regular diet, passing flatus, and urinating well. Patient is discharged home in stable condition on 06/16/2015   Physical exam  Filed Vitals:   06/15/15 1715 06/15/15 1804 06/15/15 2200 06/16/15 0535  BP: 106/62 108/65 104/58 90/52  Pulse: 63 70 97 88  Temp: 98.2 F (36.8 C) 98.2 F (36.8 C) 98.2 F (36.8 C) 98 F (36.7 C)  TempSrc: Oral Oral Oral   Resp: 20 20 20 18   Height:      Weight:      SpO2:   99%    General: alert and cooperative Lochia: appropriate Uterine Fundus: firm Incision: N/A DVT Evaluation: No  evidence of DVT seen on physical exam. Labs: Lab Results  Component Value Date   WBC 9.2 06/15/2015   HGB 12.3 06/15/2015   HCT 35.6* 06/15/2015   MCV 85.2 06/15/2015   PLT 257 06/15/2015   CMP Latest Ref Rng 01/05/2015  Glucose 65 - 99 mg/dL 244(W111(H)  BUN 6 - 20 mg/dL 10  Creatinine 1.020.44 - 7.251.00 mg/dL 3.660.57  Sodium 440135 - 347145 mmol/L 136  Potassium 3.5 - 5.1 mmol/L 3.7  Chloride 101 - 111 mmol/L 108  CO2 22 - 32 mmol/L 25  Calcium 8.9 - 10.3 mg/dL 9.0  Total Protein 6.5 - 8.1 g/dL 6.6  Total Bilirubin 0.3 - 1.2 mg/dL 0.8  Alkaline Phos 38 - 126 U/L 55  AST 15 - 41 U/L 17  ALT 14 - 54 U/L 14    Discharge instruction: per After Visit Summary and "Baby  and Me Booklet".  After visit meds:    Medication List    ASK your doctor about these medications        acetaminophen 500 MG tablet  Commonly known as:  TYLENOL  Take 500 mg by mouth every 6 (six) hours as needed for mild pain.     Prenatal Vitamin 27-0.8 MG Tabs  Take 1 tablet by mouth daily.        Diet: routine diet  Activity: Advance as tolerated. Pelvic rest for 6 weeks.   Outpatient follow up:6 weeks Follow up Appt: Future Appointments Date Time Provider Department Center  06/17/2015 9:45 AM Colleen Rude, MD [facility]   Follow up Visit:No Follow-up on file.  Postpartum contraception: Nuvaring  Newborn Data: Live born female  Birth Weight: 5 lb 13.1 oz (2640 g) APGAR: 9, 9  Baby Feeding: Breast Disposition:home with mother   06/16/2015 Colleen Maiers, MD   OB CNM attestation I have seen and examined this patient and agree with above documentation in the resident's note.   Colleen Daniels is a 24 y.o. [phone] s/p NSVD.   Pain is well controlled.  Plan for birth control is OCPs.  Method of Feeding: breast/bottle  PE:  BP 90/52 mmHg  Pulse 88  Temp(Src) 98 F (36.7 C) (Oral)  Resp 18  Ht  (1.499 m)  Wt 84.823 kg (187 lb)  BMI 37.75 kg/m2  SpO2 99%  LMP 10/02/2014  (Approximate)  Breastfeeding? Unknown Fundus firm   Recent Labs  06/15/15 0825  HGB 12.3  HCT 35.6*     Plan: discharge today - postpartum care discussed - f/u clinic in 6 weeks for postpartum visit   Colleen Daniels, Colleen Daniels, CNM 7:51 AM

## 2015-06-16 NOTE — Lactation Note (Signed)
This note was copied from the chart of Colleen Daniels. Lactation Consultation Note  Patient Name: Colleen Daniels ZOXWR'UToday's Date: 06/16/2015 Reason for consult: Follow-up assessment Baby at 20 hr of life and mom is worried that she is not making enough milk. Discussed baby behavior and soothing. Encouraged mom to manually express and spoon feed what she gets if the baby is still acting hungry. Went over breast changes and nipple care. Mom reports bilateral nipple soreness. The L is red and the R is dark pink, given comfort gels. Encouraged mom to maintain a deep latch throughout the entire feeding. She is aware of OP services and support group. She will call as needed for bf help.    Maternal Data    Feeding Feeding Type: Breast Milk Length of feed: 20 min  LATCH Score/Interventions Latch: Grasps breast easily, tongue down, lips flanged, rhythmical sucking.  Audible Swallowing: Spontaneous and intermittent  Type of Nipple: Everted at rest and after stimulation  Comfort (Breast/Nipple): Filling, red/small blisters or bruises, mild/mod discomfort  Problem noted: Mild/Moderate discomfort  Hold (Positioning): No assistance needed to correctly position infant at breast. (adjusted position)  LATCH Score: 9  Lactation Tools Discussed/Used     Consult Status Consult Status: Follow-up Date: 06/17/15 Follow-up type: In-patient    [name] 06/16/2015, 12:30 PM

## 2015-06-17 ENCOUNTER — Encounter: Payer: Self-pay | Admitting: Family Medicine

## 2015-06-19 ENCOUNTER — Telehealth: Payer: Self-pay | Admitting: Family Medicine

## 2015-06-19 NOTE — Telephone Encounter (Signed)
Patient is seeking advice because it is really painful for her to breastfeed her newborn son. There are bumps forming and she doesn't know what to do. Colleen Daniels, ASA

## 2015-06-19 NOTE — Telephone Encounter (Signed)
Return call to patient regarding breast pain.  Patient has tried warm compresses and OTC creams with no relief.  Advised patient that she should be seen to check for infections.  She has lumps on breast and arm pit.  No appointments for this afternoon or tomorrow.  Advised patient to walk-in between 8:30 AM-11 AM.  Patient stated understanding.  Clovis PuMartin, Ilyanna Baillargeon L, RN

## 2015-07-31 ENCOUNTER — Encounter: Payer: Self-pay | Admitting: Family Medicine

## 2015-07-31 ENCOUNTER — Ambulatory Visit (INDEPENDENT_AMBULATORY_CARE_PROVIDER_SITE_OTHER): Payer: Self-pay | Admitting: Family Medicine

## 2015-07-31 VITALS — BP 96/48 | HR 80 | Temp 97.9°F | Ht 59.0 in | Wt 167.0 lb

## 2015-07-31 DIAGNOSIS — O9089 Other complications of the puerperium, not elsewhere classified: Secondary | ICD-10-CM

## 2015-07-31 DIAGNOSIS — O8612 Endometritis following delivery: Secondary | ICD-10-CM

## 2015-07-31 DIAGNOSIS — R52 Pain, unspecified: Secondary | ICD-10-CM

## 2015-07-31 LAB — CBC WITH DIFFERENTIAL/PLATELET
BASOS ABS: 0 10*3/uL (ref 0.0–0.1)
BASOS PCT: 0 % (ref 0–1)
EOS ABS: 0.2 10*3/uL (ref 0.0–0.7)
Eosinophils Relative: 4 % (ref 0–5)
HCT: 37.1 % (ref 36.0–46.0)
HEMOGLOBIN: 12.3 g/dL (ref 12.0–15.0)
Lymphocytes Relative: 25 % (ref 12–46)
Lymphs Abs: 1.6 10*3/uL (ref 0.7–4.0)
MCH: 28 pg (ref 26.0–34.0)
MCHC: 33.2 g/dL (ref 30.0–36.0)
MCV: 84.3 fL (ref 78.0–100.0)
MPV: 9.7 fL (ref 8.6–12.4)
Monocytes Absolute: 0.5 10*3/uL (ref 0.1–1.0)
Monocytes Relative: 8 % (ref 3–12)
NEUTROS ABS: 3.9 10*3/uL (ref 1.7–7.7)
NEUTROS PCT: 63 % (ref 43–77)
PLATELETS: 339 10*3/uL (ref 150–400)
RBC: 4.4 MIL/uL (ref 3.87–5.11)
RDW: 13.4 % (ref 11.5–15.5)
WBC: 6.2 10*3/uL (ref 4.0–10.5)

## 2015-07-31 LAB — POCT URINALYSIS DIPSTICK
BILIRUBIN UA: NEGATIVE
Glucose, UA: NEGATIVE
LEUKOCYTES UA: NEGATIVE
NITRITE UA: NEGATIVE
PH UA: 8.5
PROTEIN UA: NEGATIVE
RBC UA: NEGATIVE
Spec Grav, UA: 1.02
Urobilinogen, UA: 1

## 2015-07-31 LAB — POCT WET PREP (WET MOUNT): CLUE CELLS WET PREP WHIFF POC: NEGATIVE

## 2015-07-31 LAB — POCT URINE PREGNANCY: Preg Test, Ur: NEGATIVE

## 2015-07-31 MED ORDER — AMOXICILLIN-POT CLAVULANATE 875-125 MG PO TABS: 1.0000 | ORAL_TABLET | Freq: Two times a day (BID) | ORAL | Status: DC

## 2015-07-31 NOTE — Progress Notes (Signed)
Date of Visit: 07/31/2015   HPI:  Postpartum visit: patient is 6 weeks postpartum following a normal vaginal delivery. No complications during labor other than PROM at term. -Breastfeeding: yes, this and bottle. No problems with breasts. -Contraception: plans to use micronor/progesterone only pill. Previously used mirena and nexplanon, and didn't do well with those. Does have trouble remembering to take pills at the same time each day. -Bleeding: none now (see below) -Bonding with infant: yes -Sexual activity: none yet, not interested -Mood: doing well. Denies SI/HI.  Patient reports that after delivery, she had bleeding for one month. Last day of bleeding was on January 25. She had foul smelling discharge and blood during that time. Since bleeding stopped, has had abdominal and pelvic pain, especially with palpation of her abdomen. Feels lots of cramping. Has not taken her temperature, but feels achy and then takes tylenol, after which she breaks out into sweats.  ROS: See HPI.  PMFSH: [id number]   PHYSICAL EXAM: BP 96/48 mmHg  Pulse 80  Temp(Src) 97.9 F (36.6 C) (Oral)  Ht  (1.499 m)  Wt 167 lb (75.751 kg)  BMI 33.71 kg/m2 Gen: NAD, pleasant, cooperative, well appearing HEENT: NCAT GU: normal appearing external genitalia without lesions. Vagina is moist with clear discharge. Cervix normal in appearance and is closed, but is noted to be slightly low in the vagina. No cervical motion tenderness. No adnexal masses. Does have tenderness of uterus during bimanual exam. Uterus does not feel particularly enlarged (limited somewhat by body habitus).  ASSESSMENT/PLAN:  25 yo G3P3003 here for 6 week postpartum check. With report of prior prolonged bleeding, and current pelvic/uterine pain, sweats after taking tylenol, concern for late onset postpartum endometritis versus retained products of conception/placenta. Discussed options today with patient, including empiric treatment,  ultrasound, labs. Patient has insurance to cover labs but not ultrasound. Will thus hold on scheduling u/s and just obtain labwork - labs: urinalysis, urine preg, serum beta hcg, cbc with diff, cmet - Treat empirically for endometritis with augmentin twice daily for 7 days. - hold on starting hormonal contraception for now, until acute issue is resolved. - Follow up in 1 week.   FOLLOW UP: Follow up in 1 week for above issues.  [place] J. Pollie Meyer, MD [facility]

## 2015-07-31 NOTE — Patient Instructions (Addendum)
Take augmentin to treat for possible infection of uterus Checking labwork and ultrasound Follow up in 1 week to ensure things are getting better Can possibly start birth control then, but want to make sure everything is normal before doing that  Be well, Dr. Pollie Meyer

## 2015-08-01 ENCOUNTER — Ambulatory Visit: Payer: Self-pay | Admitting: Family Medicine

## 2015-08-01 LAB — COMPREHENSIVE METABOLIC PANEL
ALBUMIN: 3.8 g/dL (ref 3.6–5.1)
ALT: 17 U/L (ref 6–29)
AST: 21 U/L (ref 10–30)
Alkaline Phosphatase: 77 U/L (ref 33–115)
BUN: 11 mg/dL (ref 7–25)
CALCIUM: 8.8 mg/dL (ref 8.6–10.2)
CHLORIDE: 107 mmol/L (ref 98–110)
CO2: 25 mmol/L (ref 20–31)
CREATININE: 0.61 mg/dL (ref 0.50–1.10)
GLUCOSE: 68 mg/dL (ref 65–99)
Potassium: 3.9 mmol/L (ref 3.5–5.3)
SODIUM: 141 mmol/L (ref 135–146)
Total Bilirubin: 0.9 mg/dL (ref 0.2–1.2)
Total Protein: 6.6 g/dL (ref 6.1–8.1)

## 2015-08-01 LAB — CERVICOVAGINAL ANCILLARY ONLY
Chlamydia: NEGATIVE
NEISSERIA GONORRHEA: NEGATIVE

## 2015-08-01 LAB — HCG, QUANTITATIVE, PREGNANCY

## 2015-08-08 ENCOUNTER — Ambulatory Visit: Payer: Medicaid Other | Admitting: Family Medicine

## 2015-08-14 ENCOUNTER — Telehealth: Payer: Self-pay | Admitting: Family Medicine

## 2015-08-14 MED ORDER — AMOXICILLIN 500 MG PO CAPS: 500.0000 mg | ORAL_CAPSULE | Freq: Three times a day (TID) | ORAL | Status: DC

## 2015-08-14 NOTE — Telephone Encounter (Signed)
Pt calling to request a cheaper antibiotic.  The amoxicillin is too expensive for her.  Send to [facility] on [address].

## 2015-08-14 NOTE — Telephone Encounter (Signed)
While not ideal, we can try to use regular amoxicillin (had previously prescribed augmentin, which adds coverage for anaerobes). I will send this in. One pill three times daily for 7 days.  Please call patient and let her know that if she is still having symptoms after treatment she should be seen. I last saw her several weeks ago and she no-showed to her follow up appointment.  Thanks, Latrelle Dodrill, MD

## 2015-08-18 NOTE — Telephone Encounter (Signed)
Patient informed, expressed understanding. 

## 2015-08-20 ENCOUNTER — Ambulatory Visit: Payer: Self-pay

## 2016-01-20 ENCOUNTER — Ambulatory Visit (INDEPENDENT_AMBULATORY_CARE_PROVIDER_SITE_OTHER): Payer: BLUE CROSS/BLUE SHIELD | Admitting: Internal Medicine

## 2016-01-20 ENCOUNTER — Encounter: Payer: Self-pay | Admitting: Internal Medicine

## 2016-01-20 ENCOUNTER — Telehealth: Payer: Self-pay

## 2016-01-20 VITALS — BP 134/81 | HR 63 | Temp 98.1°F | Wt 174.0 lb

## 2016-01-20 DIAGNOSIS — R1012 Left upper quadrant pain: Secondary | ICD-10-CM | POA: Diagnosis not present

## 2016-01-20 DIAGNOSIS — R109 Unspecified abdominal pain: Secondary | ICD-10-CM | POA: Diagnosis not present

## 2016-01-20 LAB — HEMOCCULT GUIAC POC 1CARD (OFFICE): FECAL OCCULT BLD: POSITIVE — AB

## 2016-01-20 LAB — CBC
HEMATOCRIT: 38.9 % (ref 35.0–45.0)
Hemoglobin: 13.2 g/dL (ref 11.7–15.5)
MCH: 28.2 pg (ref 27.0–33.0)
MCHC: 33.9 g/dL (ref 32.0–36.0)
MCV: 83.1 fL (ref 80.0–100.0)
MPV: 10.4 fL (ref 7.5–12.5)
PLATELETS: 378 10*3/uL (ref 140–400)
RBC: 4.68 MIL/uL (ref 3.80–5.10)
RDW: 13.6 % (ref 11.0–15.0)
WBC: 9.7 10*3/uL (ref 3.8–10.8)

## 2016-01-20 LAB — POCT SEDIMENTATION RATE: POCT SED RATE: 30 mm/hr — AB (ref 0–22)

## 2016-01-20 NOTE — Progress Notes (Signed)
   [facility] Noralee Chars, MD Phone: [phone]  Reason For Visit: Same Day for Abdominal Pain   # During pregancy patient had abdominal pain that was specifically down in the lower pelvic area. After pregnancy, patient continued to have abdominal pain. Patient saw Dr. Clifton Custard and was treated for possibly endometritis with a 7 day course of Augmentin. Pain never really improved, however patient had limited insurance at the time and therefore did not come back for a follow up as instructed.  Patient with new pain that started about 3 weeks ago. Pain has been constant for about 3 weeks. Pain starts in LUQ and shoots down to suprapubic area on the left side. Patient had a bowel movement this morning, and wiped and noticed blood on tissue coming from her rectum. She states blood was bigger than a quarter size. Pain not associated with eating. Patient having loose stools, and has been having 4-5 stools a day. No hx of exposure to farm animals. Patient is able to sleep at night, putting a pillow in between her legs for pressure at night. Indicates tht pressure on the stomach improves the pain. No fevers or chills. No nausea or vomiting. Patient still has an appetite and is able to eat well.   Past Medical History - Hx of persistent abdominal pain  Reviewed problem list.  Medications- reviewed and updated No additions to family history Social history- patient is a non-smoker  Objective: BP 134/81   Pulse 63   Temp 98.1 F (36.7 C) (Oral)   Wt 174 lb (78.9 kg)   LMP  (LMP Unknown) Comment: Depo injections   SpO2 98%   BMI 35.14 kg/m  Gen: NAD, alert, cooperative with exam CV: RRR, good S1/S2, no murmur, Resp: CTABL, no wheezes, non-labored Abd: BS present, soft with no guarding, tenderness to palpation throughout, however pain most significant in left upper quadrant, negative Murphy sign. No organomegaly, no CVA tenderness.   Assessment/Plan: See problem based  a/p  Abdominal pain, left upper quadrant Unlikely an acute issue as patient with chronic abdominal pain. Therefore will doubt ischemic bowel disease versus infectious proponents. No association with food and location of pain make less likely to be duodenal ulcer. Differential currently considering includes inflammatory such as Crohn's or ulcerative colitis versus IBS versus nephrolithiasis - Hemoccult - 1 Card (office) - POCT SEDIMENTATION RATE - CBC - POCT urinalysis dipstick -Follow up in 1 week -Depending on results will consider GI consult versus CT scan if blood found to be in urine   -

## 2016-01-20 NOTE — Patient Instructions (Addendum)
Please follow up in 1 week to further discuss next steps for this abdominal pain. Will let you know the results as soon as possible

## 2016-01-20 NOTE — Telephone Encounter (Signed)
Called pt to inform her we missed her urine dip- pt is to come by sometime tomm to leave a urine sample. Pt could not give me a time bc she has to arrange for child care. Please just call me when she gets here. Page.

## 2016-01-21 DIAGNOSIS — R109 Unspecified abdominal pain: Secondary | ICD-10-CM | POA: Insufficient documentation

## 2016-01-21 NOTE — Assessment & Plan Note (Addendum)
Unlikely an acute issue as patient with chronic abdominal pain. Therefore doubt ischemic bowel disease versus infectious proponents. No association with food and location of pain make less likely to be duodenal/gastric ulcer. Differential currently considering includes inflammatory such as Crohn's or ulcerative colitis versus IBS versus nephrolithiasis (though no CVA tenderness) - Hemoccult - 1 Card (office) - POCT SEDIMENTATION RATE - CBC - POCT urinalysis dipstick -Follow up in 1 week -Depending on results will consider GI consult versus CT scan if blood found to be in urine   -

## 2016-01-23 ENCOUNTER — Other Ambulatory Visit (INDEPENDENT_AMBULATORY_CARE_PROVIDER_SITE_OTHER): Payer: BLUE CROSS/BLUE SHIELD

## 2016-01-23 DIAGNOSIS — R1012 Left upper quadrant pain: Secondary | ICD-10-CM | POA: Diagnosis not present

## 2016-01-23 LAB — POCT URINALYSIS DIPSTICK
BILIRUBIN UA: NEGATIVE
GLUCOSE UA: NEGATIVE
Ketones, UA: NEGATIVE
Nitrite, UA: NEGATIVE
Protein, UA: NEGATIVE
SPEC GRAV UA: 1.025
Urobilinogen, UA: 0.2
pH, UA: 6

## 2016-01-23 LAB — POCT UA - MICROSCOPIC ONLY

## 2016-01-27 ENCOUNTER — Ambulatory Visit (INDEPENDENT_AMBULATORY_CARE_PROVIDER_SITE_OTHER): Payer: BLUE CROSS/BLUE SHIELD | Admitting: Internal Medicine

## 2016-01-27 VITALS — BP 115/69 | HR 71 | Temp 97.9°F | Wt 174.0 lb

## 2016-01-27 DIAGNOSIS — R1012 Left upper quadrant pain: Secondary | ICD-10-CM

## 2016-01-27 LAB — CBC
HCT: 39 % (ref 35.0–45.0)
HEMOGLOBIN: 12.9 g/dL (ref 11.7–15.5)
MCH: 27.8 pg (ref 27.0–33.0)
MCHC: 33.1 g/dL (ref 32.0–36.0)
MCV: 84.1 fL (ref 80.0–100.0)
MPV: 10.5 fL (ref 7.5–12.5)
Platelets: 370 10*3/uL (ref 140–400)
RBC: 4.64 MIL/uL (ref 3.80–5.10)
RDW: 13.7 % (ref 11.0–15.0)
WBC: 8.8 10*3/uL (ref 3.8–10.8)

## 2016-01-27 LAB — POCT URINALYSIS DIPSTICK
Bilirubin, UA: NEGATIVE
Blood, UA: NEGATIVE
Glucose, UA: NEGATIVE
Ketones, UA: NEGATIVE
LEUKOCYTES UA: NEGATIVE
Nitrite, UA: NEGATIVE
PROTEIN UA: NEGATIVE
Spec Grav, UA: 1.025
UROBILINOGEN UA: 0.2
pH, UA: 6

## 2016-01-27 LAB — POCT H PYLORI SCREEN: H Pylori Screen, POC: NEGATIVE

## 2016-01-27 MED ORDER — PANTOPRAZOLE SODIUM 40 MG PO TBEC: 40.0000 mg | DELAYED_RELEASE_TABLET | Freq: Every day | ORAL | 0 refills | Status: DC

## 2016-01-27 NOTE — Progress Notes (Signed)
   [facility] Colleen Mo, MD Phone: [phone]  Reason For Visit: Follow up Abdominal Pain. Previously seen at [facility] visit   #Follow up for abdominal pain. Patient states pain continues to occur as before. Into mainly severe pain, with small amount of relief from Tylenol. No further bleeding noticed in her stools, no fever,  no chills,  no nausea, no vomiting. Severe pain later on in the afternoon at times. When pain is severe, it is a 9-10. No diarrhea or constipation. Previous workup included a Hemoccult that was positive for blood and a mildly elevated ESR at 30. CBC was stable.   -No skin findings. Positive for joint pains. No weight loss   Past Medical History Reviewed problem list.  Medications- reviewed and updated No additions to family history Social history- patient is a non smoker  Objective: BP 115/69 (BP Location: Right Arm, Patient Position: Sitting, Cuff Size: Normal)   Pulse 71   Temp 97.9 F (36.6 C) (Oral)   Wt 174 lb (78.9 kg)   LMP  (LMP Unknown) Comment: Depo injections   BMI 35.14 kg/m  Gen: NAD, alert, cooperative with exam Abd: LUQ tenderness and suprapubic pain tenderness, no guarding or organomegaly Skin: no rashes no lesions  Assessment/Plan: See problem based a/p  Abdominal pain, left upper quadrant Seen diagnosis includes inflammatory, patient is positive for joint pain. UA initially positive for small lysed red blood cells will recheck again, if present will consider getting a CT scan for nephrolithiasis. Doubt that patient has a UTI as no dysuria or increased frequency however will also check a urine culture to be through. Will refer patient to GI for further workup. If patient with possible ulcer, will provide Protonix, to see if patient has any relief.  - H.pylori screen, POC - POCT urinalysis dipstick - Urine culture - Ambulatory referral to Gastroenterology - CBC - pantoprazole (PROTONIX) 40 MG tablet; Take 1 tablet  (40 mg total) by mouth daily.  Dispense: 30 tablet; Refill: 0

## 2016-01-27 NOTE — Patient Instructions (Signed)
Please start protonix once daily. Follow up with GI.

## 2016-01-28 ENCOUNTER — Telehealth: Payer: Self-pay | Admitting: Gastroenterology

## 2016-01-28 NOTE — Telephone Encounter (Signed)
I put her with Doug SouJessica Zehr PA 02/02/16 at 8:30 am. Please contact her and add to the appointment notes.  Thank you

## 2016-01-28 NOTE — Telephone Encounter (Signed)
Needs first available provider visit (MD or extender) within next 3-5 days.  Double book with me next week if no other spots available in 3-5 days).  thanks

## 2016-01-28 NOTE — Assessment & Plan Note (Signed)
Seen diagnosis includes inflammatory, patient is positive for joint pain. UA initially positive for small lysed red blood cells will recheck again, if present will consider getting a CT scan for nephrolithiasis. Doubt that patient has a UTI as no dysuria or increased frequency however will also check a urine culture to be through. Will refer patient to GI for further workup. If patient with possible ulcer, will provide Protonix, to see if patient has any relief.  - H.pylori screen, POC - POCT urinalysis dipstick - Urine culture - Ambulatory referral to Gastroenterology - CBC - pantoprazole (PROTONIX) 40 MG tablet; Take 1 tablet (40 mg total) by mouth daily.  Dispense: 30 tablet; Refill: 0

## 2016-01-28 NOTE — Telephone Encounter (Signed)
Patient is aware of appointment °

## 2016-01-29 LAB — URINE CULTURE: Organism ID, Bacteria: NO GROWTH

## 2016-02-02 ENCOUNTER — Ambulatory Visit (INDEPENDENT_AMBULATORY_CARE_PROVIDER_SITE_OTHER): Payer: BLUE CROSS/BLUE SHIELD | Admitting: Gastroenterology

## 2016-02-02 ENCOUNTER — Encounter: Payer: Self-pay | Admitting: Gastroenterology

## 2016-02-02 ENCOUNTER — Other Ambulatory Visit: Payer: BLUE CROSS/BLUE SHIELD

## 2016-02-02 ENCOUNTER — Ambulatory Visit: Payer: BLUE CROSS/BLUE SHIELD | Admitting: Gastroenterology

## 2016-02-02 VITALS — BP 132/80 | HR 68 | Ht 63.0 in | Wt 176.0 lb

## 2016-02-02 DIAGNOSIS — R197 Diarrhea, unspecified: Secondary | ICD-10-CM | POA: Diagnosis not present

## 2016-02-02 DIAGNOSIS — R109 Unspecified abdominal pain: Secondary | ICD-10-CM | POA: Diagnosis not present

## 2016-02-02 DIAGNOSIS — R195 Other fecal abnormalities: Secondary | ICD-10-CM

## 2016-02-02 DIAGNOSIS — R6883 Chills (without fever): Secondary | ICD-10-CM

## 2016-02-02 MED ORDER — DICYCLOMINE HCL 10 MG PO CAPS: ORAL_CAPSULE | ORAL | 2 refills | Status: DC

## 2016-02-02 NOTE — Patient Instructions (Addendum)
Please go to the basement level to our lab for a urine pregnancy test.  We sent prescriptions to [facility].  1. Suprep for the colonoscopy prep. 2. Dicyclomine.   You have been scheduled for a colonoscopy. Please follow written instructions given to you at your visit today.  Please pick up your prep supplies at the pharmacy within the next 1-3 days. [employer]. If you use inhalers (even only as needed), please bring them with you on the day of your procedure. Your physician has requested that you go to www.startemmi.com and enter the access code given to you at your visit today. This web site gives a general overview about your procedure. However, you should still follow specific instructions given to you by our office regarding your preparation for the procedure.   You have been scheduled for a CT scan of the abdomen and pelvis at [facility] ([address]---this is in the same building as Press [facility]).   You are scheduled on Wed 02-04-2016 at 2:30 PM. You should arrive at 2:15 minutes prior to your appointment time for registration. Please follow the written instructions below on the day of your exam:  WARNING: IF YOU ARE ALLERGIC TO IODINE/X-RAY DYE, PLEASE NOTIFY RADIOLOGY IMMEDIATELY AT [phone]! YOU WILL BE GIVEN A 13 HOUR PREMEDICATION PREP.  1) Do not eat or drink anything after 11:30 am (4 hours prior to your test) 2) You have been given 2 bottles of oral contrast to drink. The solution may taste               better if refrigerated, but do NOT add ice or any other liquid to this solution. Shake             well before drinking.    Drink 1 bottle of contrast @ 1:30 PM (2 hours prior to your exam)  Drink 1 bottle of contrast @ 2:30 PM (1 hour prior to your exam)  You may take any medications as prescribed with a small amount of water except for the following: Metformin, Glucophage, Glucovance, Avandamet, Riomet, Fortamet, Actoplus  Met, Janumet, Glumetza or Metaglip. The above medications must be held the day of the exam AND 48 hours after the exam.  The purpose of you drinking the oral contrast is to aid in the visualization of your intestinal tract. The contrast solution may cause some diarrhea. Before your exam is started, you will be given a small amount of fluid to drink. Depending on your individual set of symptoms, you may also receive an intravenous injection of x-ray contrast/dye. Plan on being at [facility] for 30 minutes or long, depending on the type of exam you are having performed.  If you have any questions regarding your exam or if you need to reschedule, you may call the CT department at [phone] between the hours of 8:00 am and 5:00 pm, Monday-Friday.  ________________________________________________________________________

## 2016-02-03 ENCOUNTER — Telehealth: Payer: Self-pay | Admitting: Internal Medicine

## 2016-02-03 LAB — PREGNANCY, URINE: PREG TEST UR: NEGATIVE

## 2016-02-03 NOTE — Telephone Encounter (Signed)
Called patient to discuss results of labs. Voice mail, therefore left message letting patient know that all results of exam were negative. Follow up with GI.

## 2016-02-04 ENCOUNTER — Inpatient Hospital Stay: Admission: RE | Admit: 2016-02-04 | Payer: BLUE CROSS/BLUE SHIELD | Source: Ambulatory Visit

## 2016-02-05 ENCOUNTER — Ambulatory Visit (INDEPENDENT_AMBULATORY_CARE_PROVIDER_SITE_OTHER)
Admission: RE | Admit: 2016-02-05 | Discharge: 2016-02-05 | Disposition: A | Discharge status: Home / Self Care | Payer: BLUE CROSS/BLUE SHIELD | Source: Ambulatory Visit | Attending: Gastroenterology | Admitting: Gastroenterology

## 2016-02-05 ENCOUNTER — Telehealth: Payer: Self-pay | Admitting: Gastroenterology

## 2016-02-05 DIAGNOSIS — R109 Unspecified abdominal pain: Secondary | ICD-10-CM

## 2016-02-05 DIAGNOSIS — R197 Diarrhea, unspecified: Secondary | ICD-10-CM | POA: Diagnosis not present

## 2016-02-05 DIAGNOSIS — R6883 Chills (without fever): Secondary | ICD-10-CM | POA: Diagnosis not present

## 2016-02-05 MED ORDER — NA SULFATE-K SULFATE-MG SULF 17.5-3.13-1.6 GM/177ML PO SOLN: 1.0000 | Freq: Once | ORAL | 0 refills | Status: AC

## 2016-02-05 MED ORDER — IOPAMIDOL (ISOVUE-300) INJECTION 61%
100.0000 mL | Freq: Once | INTRAVENOUS | Status: AC | PRN
  Administered 2016-02-05: 100 mL via INTRAVENOUS

## 2016-02-05 NOTE — Telephone Encounter (Signed)
Prescription resent to patient's pharmacy. 

## 2016-02-09 ENCOUNTER — Encounter: Payer: Self-pay | Admitting: Gastroenterology

## 2016-02-09 DIAGNOSIS — R6883 Chills (without fever): Secondary | ICD-10-CM | POA: Insufficient documentation

## 2016-02-09 DIAGNOSIS — R195 Other fecal abnormalities: Secondary | ICD-10-CM | POA: Insufficient documentation

## 2016-02-09 DIAGNOSIS — R197 Diarrhea, unspecified: Secondary | ICD-10-CM | POA: Insufficient documentation

## 2016-02-09 NOTE — Progress Notes (Signed)
02/02/2016 Colleen Daniels [id number] [birth year]   HISTORY OF PRESENT ILLNESS:  This is a pleasant 25 year old female who is new to our office and has been referred here by her PCP, Dr. Cathlean CowerMikell, for evaluation of left sided abdominal pain, diarrhea, and chills.  She says that she always has diarrhea for the most part.  Then for the past 3 weeks or so she has been experiencing left sided abdominal pain (mostly LLQ) that has been worsening.  Reaches 9-10 in severity at times.  Reports that she had an ultrasound to evaluate her ovaries and that was negative.  Says that it feels sore to the touch all over her abdomen.  Has nausea and chills at time as well.  CBC ok and Hpylori study negative.  Was hemoccult positive, however, and sed rate slightly elevate at 30.  Has seen blood in her stool once that she can recall.   Past Medical History:  Diagnosis Date  . 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 No pertinent past medical history    Past Surgical History:  Procedure Laterality Date  . NO PAST SURGERIES      reports that she has never smoked. She has never used smokeless tobacco. She reports that she does not drink alcohol or use drugs. family history includes Diabetes in her maternal grandmother; Hypertension in her maternal grandmother. No Known Allergies    Outpatient Encounter Prescriptions as of 02/02/2016  Medication Sig  . acetaminophen (TYLENOL) 500 MG tablet Take 500 mg by mouth every 6 (six) hours as needed for mild pain.  . pantoprazole (PROTONIX) 40 MG tablet Take 1 tablet (40 mg total) by mouth daily.  Marland Kitchen. dicyclomine (BENTYL) 10 MG capsule Take 1 tab twice daily.  . [DISCONTINUED] amoxicillin (AMOXIL) 500 MG capsule Take 1 capsule (500 mg total) by mouth 3 (three) times daily.  . [DISCONTINUED] ibuprofen (ADVIL,MOTRIN) 600 MG tablet Take 1 tablet (600 mg total) by mouth every 6 (six) hours.  . [DISCONTINUED] Prenatal Vit-Fe Fumarate-FA (PRENATAL VITAMIN) 27-0.8 MG TABS Take 1  tablet by mouth daily.   No facility-administered encounter medications on file as of 02/02/2016.     REVIEW OF SYSTEMS  : All other systems reviewed and negative except where noted in the History of Present Illness.   PHYSICAL EXAM: BP 132/80 (BP Location: Right Arm, Patient Position: Sitting, Cuff Size: Normal)   Pulse 68   Ht 5\' 3"  (1.6 m)   Wt 176 lb (79.8 kg)   LMP  (LMP Unknown) Comment: Depo injections, stopped 01/23/16  Breastfeeding? No   BMI 31.18 kg/m  General: Well developed female in no acute distress Head: Normocephalic and atraumatic Eyes:  Sclerae anicteric, conjunctiva pink. Ears: Normal auditory acuity Lungs: Clear throughout to auscultation Heart: Regular rate and rhythm Abdomen: Soft, non-distended.  BS present.  Moderate LLQ TTP. Rectal:  Will be done at the time of colonoscopy. Musculoskeletal: Symmetrical with no gross deformities  Skin: No lesions on visible extremities Extremities: No edema  Neurological: Alert oriented x 4, grossly non-focal Psychological:  Alert and cooperative. Normal mood and affect  ASSESSMENT AND PLAN: -25 year old female with complaints of left sided abdominal pain, diarrhea, and chills.  Was hemoccult positive as well.  She is not in the typical age range for diverticulitis although it is certainly possible.  Also need to consider IBD (sed rate slightly elevated).  Will plan for CT scan of the abdomen and pelvis with contrast  and if that is negative then will plan to proceed with colonoscopy.  Will give dicyclomine to use in the meantime to see if this helps with her pain.  CC:  Wendee BeaversMcMullen, David J, DO

## 2016-02-09 NOTE — Progress Notes (Signed)
Reviewed and agree with initial management plan.  Malcolm T. Stark, MD FACG 

## 2016-02-10 ENCOUNTER — Other Ambulatory Visit: Payer: Self-pay

## 2016-02-10 MED ORDER — NA SULFATE-K SULFATE-MG SULF 17.5-3.13-1.6 GM/177ML PO SOLN: 1.0000 | Freq: Once | ORAL | 0 refills | Status: AC

## 2016-03-01 ENCOUNTER — Encounter: Payer: BLUE CROSS/BLUE SHIELD | Admitting: Gastroenterology

## 2016-03-15 ENCOUNTER — Encounter: Payer: Self-pay | Admitting: Gastroenterology

## 2016-03-15 ENCOUNTER — Ambulatory Visit (AMBULATORY_SURGERY_CENTER): Payer: BLUE CROSS/BLUE SHIELD | Admitting: Gastroenterology

## 2016-03-15 VITALS — BP 98/62 | HR 63 | Temp 98.7°F | Resp 13 | Ht 63.0 in | Wt 176.0 lb

## 2016-03-15 DIAGNOSIS — R197 Diarrhea, unspecified: Secondary | ICD-10-CM

## 2016-03-15 DIAGNOSIS — R195 Other fecal abnormalities: Secondary | ICD-10-CM | POA: Diagnosis not present

## 2016-03-15 DIAGNOSIS — R109 Unspecified abdominal pain: Secondary | ICD-10-CM | POA: Diagnosis present

## 2016-03-15 MED ORDER — DICYCLOMINE HCL 10 MG PO CAPS: 10.0000 mg | ORAL_CAPSULE | Freq: Three times a day (TID) | ORAL | 0 refills | Status: DC

## 2016-03-15 MED ORDER — SODIUM CHLORIDE 0.9 % IV SOLN: 500.0000 mL | INTRAVENOUS | Status: DC

## 2016-03-15 NOTE — Patient Instructions (Signed)
YOU HAD AN ENDOSCOPIC PROCEDURE TODAY AT THE [facility]:   Refer to the procedure report that was given to you for any specific questions about what was found during the examination.  If the procedure report does not answer your questions, please call your gastroenterologist to clarify.  If you requested that your care partner not be given the details of your procedure findings, then the procedure report has been included in a sealed envelope for you to review at your convenience later.  YOU SHOULD EXPECT: Some feelings of bloating in the abdomen. Passage of more gas than usual.  Walking can help get rid of the air that was put into your GI tract during the procedure and reduce the bloating. If you had a lower endoscopy (such as a colonoscopy or flexible sigmoidoscopy) you may notice spotting of blood in your stool or on the toilet paper. If you underwent a bowel prep for your procedure, you may not have a normal bowel movement for a few days.  Please Note:  You might notice some irritation and congestion in your nose or some drainage.  This is from the oxygen used during your procedure.  There is no need for concern and it should clear up in a day or so.  SYMPTOMS TO REPORT IMMEDIATELY:   Following lower endoscopy (colonoscopy or flexible sigmoidoscopy):  Excessive amounts of blood in the stool  Significant tenderness or worsening of abdominal pains  Swelling of the abdomen that is new, acute  Fever of 100F or higher   For urgent or emergent issues, a gastroenterologist can be reached at any hour by calling [phone].   DIET:  We do recommend a small meal at first, but then you may proceed to your regular diet.  Drink plenty of fluids but you should avoid alcoholic beverages for 24 hours.  ACTIVITY:  You should plan to take it easy for the rest of today and you should NOT DRIVE or use heavy machinery until tomorrow (because of the sedation medicines used during the test).     FOLLOW UP: Our staff will call the number listed on your records the next business day following your procedure to check on you and address any questions or concerns that you may have regarding the information given to you following your procedure. If we do not reach you, we will leave a message.  However, if you are feeling well and you are not experiencing any problems, there is no need to return our call.  We will assume that you have returned to your regular daily activities without incident.  If any biopsies were taken you will be contacted by phone or by letter within the next 1-3 weeks.  Please call us at [phone] if you have not heard about the biopsies in 3 weeks.    SIGNATURES/CONFIDENTIALITY: You and/or your care partner have signed paperwork which will be entered into your electronic medical record.  These signatures attest to the fact that that the information above on your After Visit Summary has been reviewed and is understood.  Full responsibility of the confidentiality of this discharge information lies with you and/or your care-partner.  Your bentyl prescription was called to your pharmacy.  The imodium is available over the counter.   Read the handout given to you by your recovery rpopom nurse.   Thank-you for choosing us for your healthcare needs today.

## 2016-03-15 NOTE — Progress Notes (Signed)
Report to PACU, RN, vss, BBS= Clear.  

## 2016-03-15 NOTE — Op Note (Signed)
[facility] Patient Name: Colleen Daniels Procedure Date: 03/15/2016 4:04 PM MRN: [id number] Endoscopist: Meryl Dare , MD Age: 25 Referring MD:  Date of Birth: 26-Oct-1990 Gender: Female Account #: [id number] Procedure:                Colonoscopy Indications:              Abdominal pain in the left lower quadrant,                            Clinically significant diarrhea of unexplained                            origin, Heme positive stool Medicines:                Monitored Anesthesia Care Procedure:                Pre-Anesthesia Assessment:                           - Prior to the procedure, a History and Physical                            was performed, and patient medications and                            allergies were reviewed. The patient's tolerance of                            previous anesthesia was also reviewed. The risks                            and benefits of the procedure and the sedation                            options and risks were discussed with the patient.                            All questions were answered, and informed consent                            was obtained. Prior Anticoagulants: The patient has                            taken no previous anticoagulant or antiplatelet                            agents. ASA Grade Assessment: II - A patient with                            mild systemic disease. After reviewing the risks                            and benefits, the patient was deemed in  satisfactory condition to undergo the procedure.                           After obtaining informed consent, the colonoscope                            was passed under direct vision. Throughout the                            procedure, the patient's blood pressure, pulse, and                            oxygen saturations were monitored continuously. The                            Model PCF-H190L [id number])  scope was introduced                            through the anus and advanced to the the terminal                            ileum. The terminal ileum, ileocecal valve,                            appendiceal orifice, and rectum were photographed.                            The quality of the bowel preparation was excellent.                            The colonoscopy was performed without difficulty.                            The patient tolerated the procedure well. Scope In: 4:27:11 PM Scope Out: 4:39:32 PM Scope Withdrawal Time: 0 hours 9 minutes 56 seconds  Total Procedure Duration: 0 hours 12 minutes 21 seconds  Findings:                 The perianal and digital rectal examinations were                            normal.                           The entire examined colon appeared normal on direct                            and retroflexion views.                           The terminal ileum appeared normal. Complications:            No immediate complications. Estimated blood loss:                            None. Estimated Blood Loss:  Estimated blood loss: none. Impression:               - The entire examined colon is normal on direct and                            retroflexion views.                           - The examined portion of the ileum was normal.                           - No specimens collected. Recommendation:           - Repeat colonoscopy at appointment at age 25 for                            screening purposes.                           - Patient has a contact number available for                            emergencies. The signs and symptoms of potential                            delayed complications were discussed with the                            patient. Return to normal activities tomorrow.                            Written discharge instructions were provided to the                            patient.                           - Resume previous  diet.                           - Continue present medications.                           - Bentyl 10 mg tid prn abdominal pain/cramping                           - Imodium 1 po bid prn diarrhea                           - Office appt in 6-8 weeks Meryl DareMalcolm T Stark, MD 03/15/2016 4:46:03 PM This report has been signed electronically.

## 2016-03-16 ENCOUNTER — Telehealth: Payer: Self-pay | Admitting: *Deleted

## 2016-03-16 NOTE — Telephone Encounter (Signed)
  Follow up Call-  Call back number 03/15/2016  Post procedure Call Back phone  # [phone]  Permission to leave phone message Yes  Some recent data might be hidden     Patient questions:  Do you have a fever, pain , or abdominal swelling? No. Pain Score  0 *  Have you tolerated food without any problems? Yes.    Have you been able to return to your normal activities? Yes.    Do you have any questions about your discharge instructions: Diet   No. Medications  No. Follow up visit  No.  Do you have questions or concerns about your Care? No.  Actions: * If pain score is 4 or above: No action needed, pain <4.

## 2016-04-16 ENCOUNTER — Encounter: Payer: Self-pay | Admitting: Family Medicine

## 2016-04-16 ENCOUNTER — Ambulatory Visit (INDEPENDENT_AMBULATORY_CARE_PROVIDER_SITE_OTHER): Payer: BLUE CROSS/BLUE SHIELD | Admitting: Family Medicine

## 2016-04-16 DIAGNOSIS — J069 Acute upper respiratory infection, unspecified: Secondary | ICD-10-CM

## 2016-04-16 MED ORDER — ACETAMINOPHEN-CODEINE #3 300-30 MG PO TABS: 1.0000 | ORAL_TABLET | Freq: Four times a day (QID) | ORAL | 0 refills | Status: DC | PRN

## 2016-04-16 NOTE — Progress Notes (Signed)
   Subjective:   Patient ID: Colleen Daniels    DOB: 05-04-1991, 25 y.o. female   MRN: [id number]  CC: Cough  HPI: Colleen Daniels is a 25 y.o. female who presents to clinic today for SDA concerning cough. Problems discussed today are as follows:  Cough: onset 3 weeks ago with clear rhinorrhea, sore throat, and right ear pain. Symptoms have cleared except for persistent non-productive cough. Patient has been taking robitussin with minimal relief. Cough has been keeping her up at night and has cause an episode of post-tusive vomiting with non-bloody non-bilius emesis. Other symptoms including wheezing which have resolved. No h/o asthma. Patient denies fevers, chills, change in appetite, chest pain, dyspnea, abdominal pain, joint aches, myalgias, headaches, hearing loss, or muffled voice. Does note family members with similar complaints including husband and eight children. Continues to decline flu vaccine after resolution of symptoms.  ROS: Complete ROS performed, see HPI for pertinent ROS.  PMFSH: Pertinent past medical, surgical, family, and social history were reviewed and updated as appropriate. Smoking status reviewed.  Medications reviewed. Current Outpatient Prescriptions  Medication Sig Dispense Refill  . pantoprazole (PROTONIX) 40 MG tablet Take 1 tablet (40 mg total) by mouth daily. 30 tablet 0  . acetaminophen (TYLENOL) 500 MG tablet Take 500 mg by mouth every 6 (six) hours as needed for mild pain.    Marland Kitchen. acetaminophen-codeine (TYLENOL #3) 300-30 MG tablet Take 1 tablet by mouth every 6 (six) hours as needed for moderate pain. 30 tablet 0   No current facility-administered medications for this visit.     Objective:   BP 117/74   Pulse 74   Temp 98.2 F (36.8 C) (Oral)   Wt 169 lb (76.7 kg)   SpO2 95%   BMI 29.94 kg/m  Vitals and nursing note reviewed.  General: well nourished, well developed, in no acute distress with non-toxic appearance HEENT:  normocephalic, atraumatic, moist mucous membranes Neck: supple, non-tender without lymphadenopathy CV: regular rate and rhythm without murmurs, rubs, or gallops, no lower extremity edema Lungs: clear to auscultation bilaterally with normal work of breathing, non-productive cough present Abdomen: soft, non-tender, non-distended, no masses or organomegaly palpable, normoactive bowel sounds Skin: warm, dry, no rashes or lesions, cap refill < 2 seconds Extremities: warm and well perfused, normal tone  Assessment & Plan:   Acute upper respiratory infection Acute. Symptoms consistent with viral URI. No flu or bacterial symptoms. Sick contacts with similar complaints. Cough is main complaint with h/o posttusive emesis and keepinger her up at night. - Tylenol #3 q6h PRN cough, #30 tabs filled, 0 refills - Hot tea with honey as needed to help sooth throat - Encouraged hydration - Keep hands washed and avoid touching face - Red flags discussed with patient  No orders of the defined types were placed in this encounter.  Meds ordered this encounter  Medications  . acetaminophen-codeine (TYLENOL #3) 300-30 MG tablet    Sig: Take 1 tablet by mouth every 6 (six) hours as needed for moderate pain.    Dispense:  30 tablet    Refill:  0    Durward Parcelavid Damyen Knoll, DO [facility], PGY-1 04/16/2016 3:41 PM

## 2016-04-16 NOTE — Patient Instructions (Addendum)
It was a pleasure to meet you today. Please see below to review our plan for today's visit.  1. Your symptoms are consistent with a upper respiratory viral infection. Your symptoms are not consistent with the flu or a bacterial infection. Please take the tylenol #3 every 6 hours for cough as needed. 2. Continue taking Robitussin for cough Supressant at night to help you sleep. You can elevated yourself with pillows due to drainage from your recovering infection. Honey and tea are great supplements to help sooth your symptoms. 3. Symptoms to watch for include a fever of >100.2F, begin having prolonged shortness of breath or wheezing, or cough becomes productive.  Please call the clinic at [phone] if your symptoms worsen or you have any concerns. It was my pleasure to see you. -- Durward Parcel, DO [facility], PGY-1   Upper Respiratory Infection, Adult Most upper respiratory infections (URIs) are a viral infection of the air passages leading to the lungs. A URI affects the nose, throat, and upper air passages. The most common type of URI is nasopharyngitis and is typically referred to as "the common cold." URIs run their course and usually go away on their own. Most of the time, a URI does not require medical attention, but sometimes a bacterial infection in the upper airways can follow a viral infection. This is called a secondary infection. Sinus and middle ear infections are common types of secondary upper respiratory infections. Bacterial pneumonia can also complicate a URI. A URI can worsen asthma and chronic obstructive pulmonary disease (COPD). Sometimes, these complications can require emergency medical care and may be life threatening.  CAUSES Almost all URIs are caused by viruses. A virus is a type of germ and can spread from one person to another.  RISKS FACTORS You may be at risk for a URI if:   You smoke.   You have chronic heart or lung disease.  You have  a weakened defense (immune) system.   You are very young or very old.   You have nasal allergies or asthma.  You work in crowded or poorly ventilated areas.  You work in health care facilities or schools. SIGNS AND SYMPTOMS  Symptoms typically develop 2-3 days after you come in contact with a cold virus. Most viral URIs last 7-10 days. However, viral URIs from the influenza virus (flu virus) can last 14-18 days and are typically more severe. Symptoms may include:   Runny or stuffy (congested) nose.   Sneezing.   Cough.   Sore throat.   Headache.   Fatigue.   Fever.   Loss of appetite.   Pain in your forehead, behind your eyes, and over your cheekbones (sinus pain).  Muscle aches.  DIAGNOSIS  Your health care provider may diagnose a URI by:  Physical exam.  Tests to check that your symptoms are not due to another condition such as:  Strep throat.  Sinusitis.  Pneumonia.  Asthma. TREATMENT  A URI goes away on its own with time. It cannot be cured with medicines, but medicines may be prescribed or recommended to relieve symptoms. Medicines may help:  Reduce your fever.  Reduce your cough.  Relieve nasal congestion. HOME CARE INSTRUCTIONS   Take medicines only as directed by your health care provider.   Gargle warm saltwater or take cough drops to comfort your throat as directed by your health care provider.  Use a warm mist humidifier or inhale steam from a shower to increase air  moisture. This may make it easier to breathe.  Drink enough fluid to keep your urine clear or pale yellow.   Eat soups and other clear broths and maintain good nutrition.   Rest as needed.   Return to work when your temperature has returned to normal or as your health care provider advises. You may need to stay home longer to avoid infecting others. You can also use a face mask and careful hand washing to prevent spread of the virus.  Increase the usage of your  inhaler if you have asthma.   Do not use any tobacco products, including cigarettes, chewing tobacco, or electronic cigarettes. If you need help quitting, ask your health care provider. PREVENTION  The best way to protect yourself from getting a cold is to practice good hygiene.   Avoid oral or hand contact with people with cold symptoms.   Wash your hands often if contact occurs.  There is no clear evidence that vitamin C, vitamin E, echinacea, or exercise reduces the chance of developing a cold. However, it is always recommended to get plenty of rest, exercise, and practice good nutrition.  SEEK MEDICAL CARE IF:   You are getting worse rather than better.   Your symptoms are not controlled by medicine.   You have chills.  You have worsening shortness of breath.  You have brown or red mucus.  You have yellow or brown nasal discharge.  You have pain in your face, especially when you bend forward.  You have a fever.  You have swollen neck glands.  You have pain while swallowing.  You have white areas in the back of your throat. SEEK IMMEDIATE MEDICAL CARE IF:   You have severe or persistent:  Headache.  Ear pain.  Sinus pain.  Chest pain.  You have chronic lung disease and any of the following:  Wheezing.  Prolonged cough.  Coughing up blood.  A change in your usual mucus.  You have a stiff neck.  You have changes in your:  Vision.  Hearing.  Thinking.  Mood. MAKE SURE YOU:   Understand these instructions.  Will watch your condition.  Will get help right away if you are not doing well or get worse.   This information is not intended to replace advice given to you by your health care provider. Make sure you discuss any questions you have with your health care provider.   Document Released: 12/01/2000 Document Revised: 10/22/2014 Document Reviewed: 09/12/2013 Elsevier Interactive Patient Education [employer]2016 Elsevier Inc.

## 2016-04-16 NOTE — Assessment & Plan Note (Addendum)
Acute. Symptoms consistent with viral URI. No flu or bacterial symptoms. Sick contacts with similar complaints. Cough is main complaint with h/o posttusive emesis and keepinger her up at night. - Tylenol #3 q6h PRN cough, #30 tabs filled, 0 refills - Hot tea with honey as needed to help sooth throat - Encouraged hydration - Keep hands washed and avoid touching face - Red flags discussed with patient

## 2016-09-28 IMAGING — CT CT ABD-PELV W/ CM
2 of 4 series · 16 of 46 positions shown, 18 images · IV contrast (ISOVUE 300)
Comparison: None.

CLINICAL DATA: Left lower quadrant left-sided abdominal pain with
diarrhea for 1 month. Chills. Question diverticulitis.

EXAM:
CT ABDOMEN AND PELVIS WITH CONTRAST
TECHNIQUE: Multidetector CT imaging of the abdomen and pelvis was performed
using the standard protocol following bolus administration of
intravenous contrast.
CONTRAST:  100mL ZUSUXR-ACC IOPAMIDOL (ZUSUXR-ACC) INJECTION 61%

[Series 2: abd/ pelvis · axial · 0.74mm/px · z∈[-461,-21]mm · 13 of 96 slices shown, 15 images]
[im 4/96  soft-tissue]
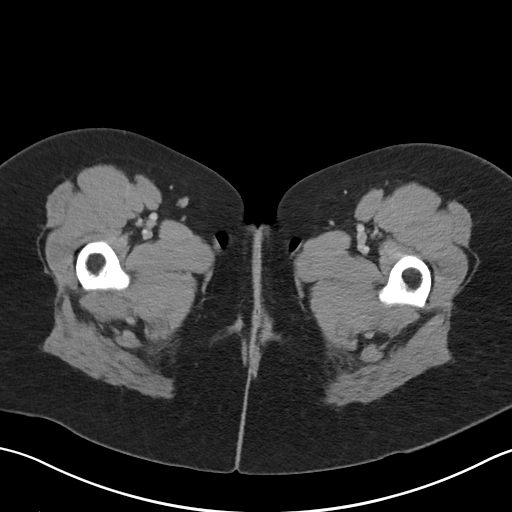
[im 4/96  bone]
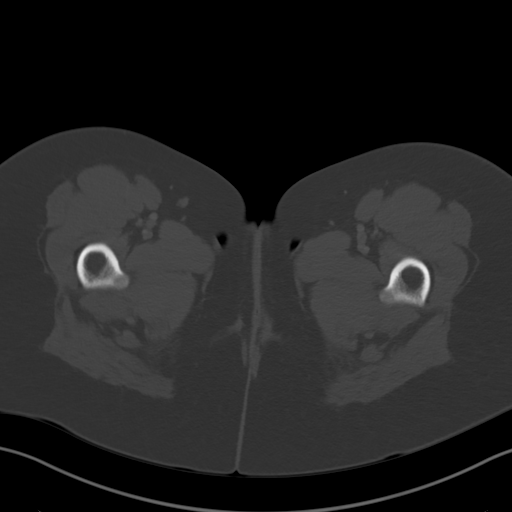
[im 12/96  soft-tissue]
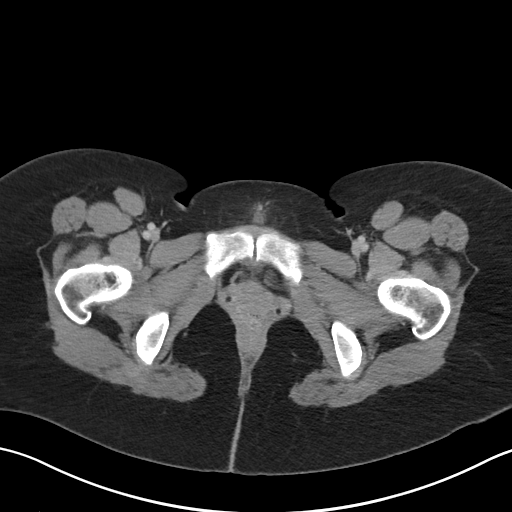
[im 20/96  soft-tissue]
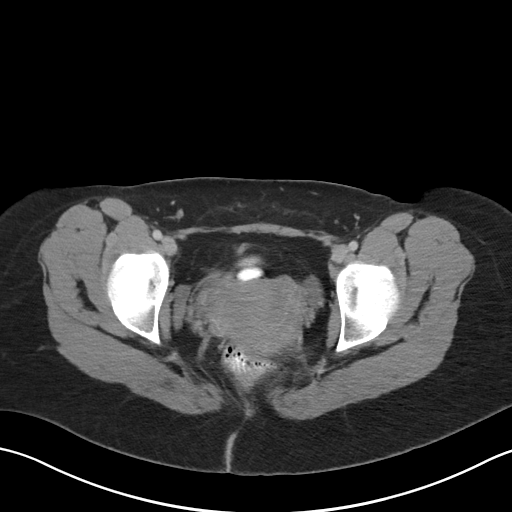
[im 27/96  soft-tissue]
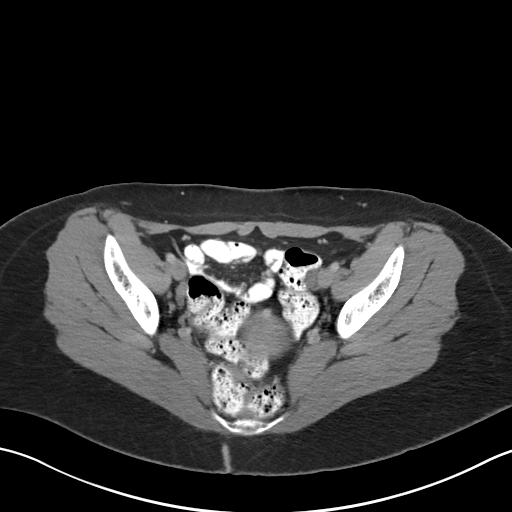
[im 35/96  soft-tissue]
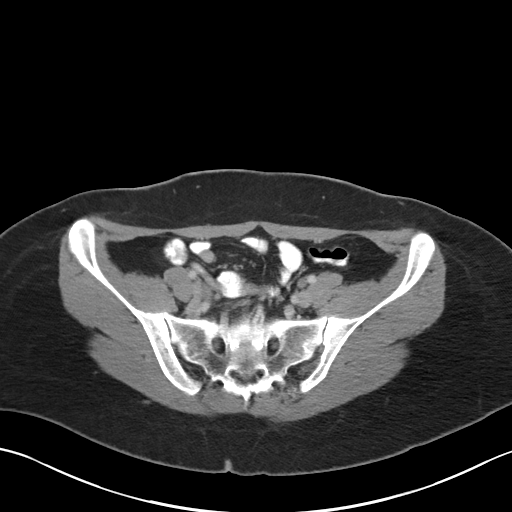
[im 42/96  soft-tissue]
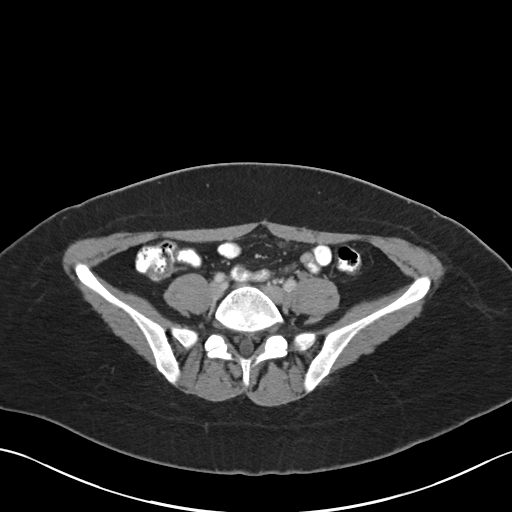
[im 50/96  soft-tissue]
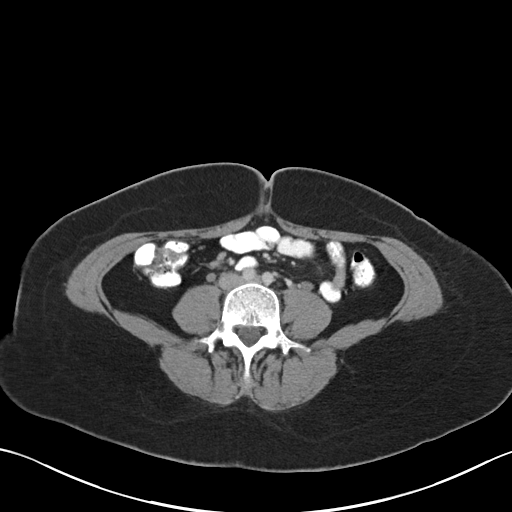
[im 54/96  soft-tissue]
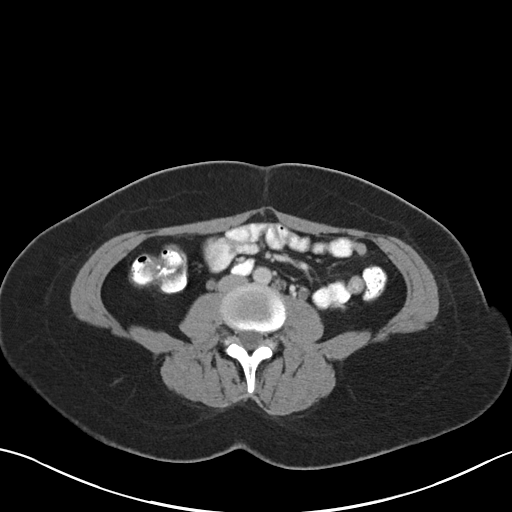
[im 61/96  soft-tissue]
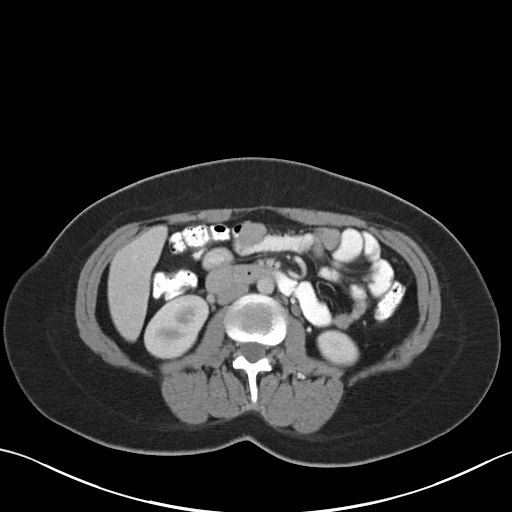
[im 61/96  bone]
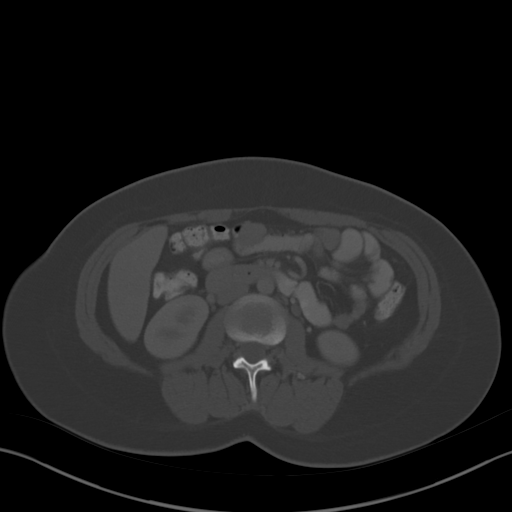
[im 69/96  soft-tissue]
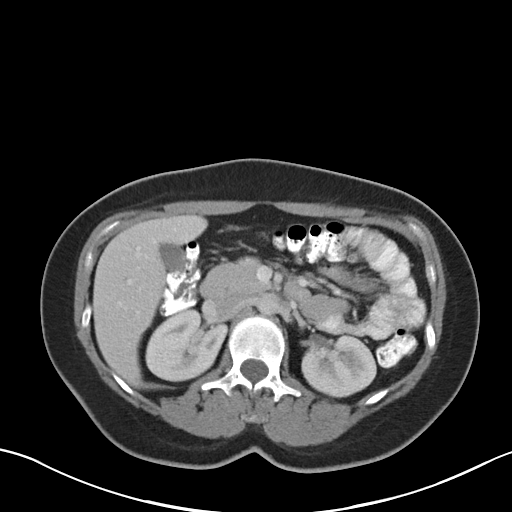
[im 77/96  soft-tissue]
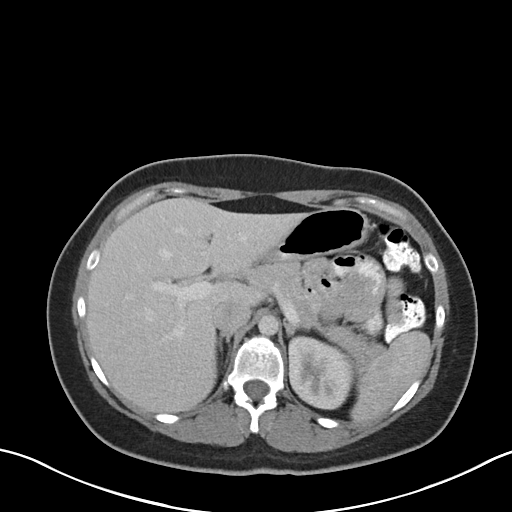
[im 84/96  soft-tissue]
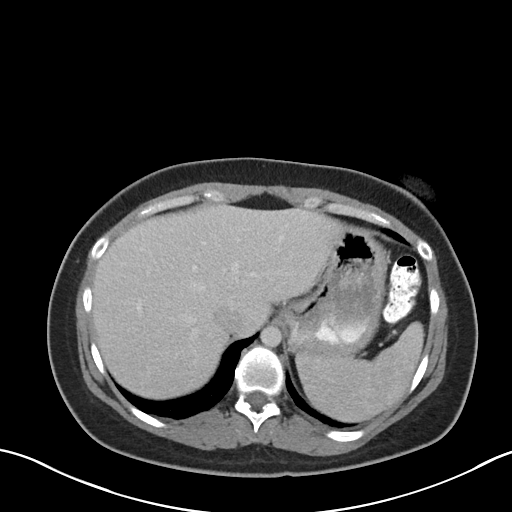
[im 92/96  soft-tissue]
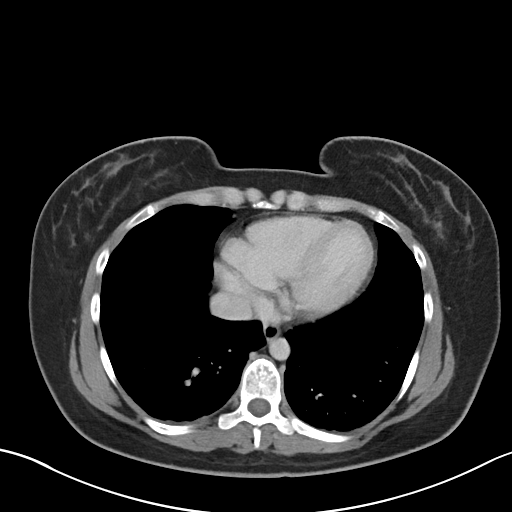

[Series 5: coronal soft tissue · coronal · 0.65mm/px · 3 of 64 slices shown]
[im 22/64  soft-tissue]
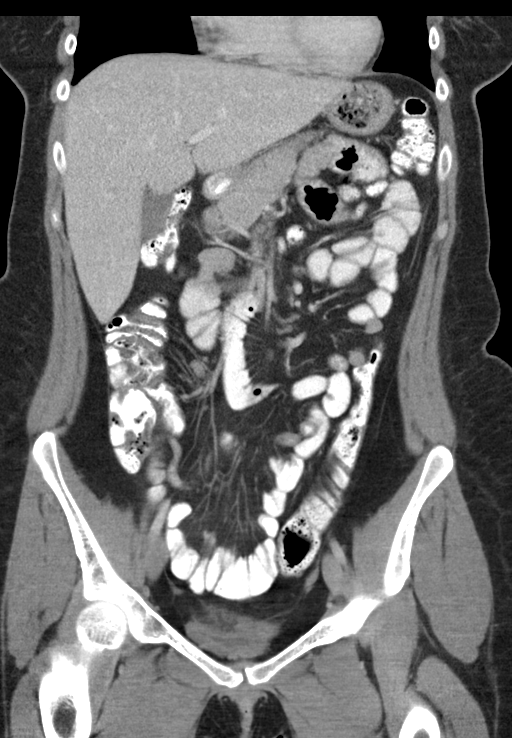
[im 29/64  soft-tissue]
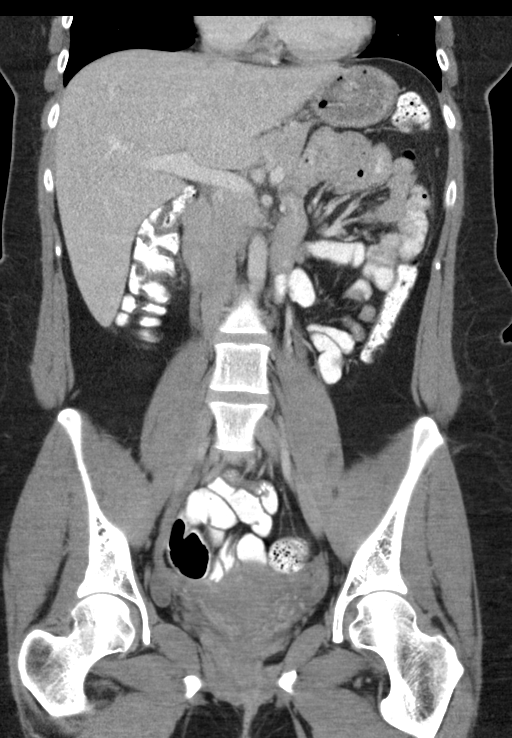
[im 36/64  soft-tissue]
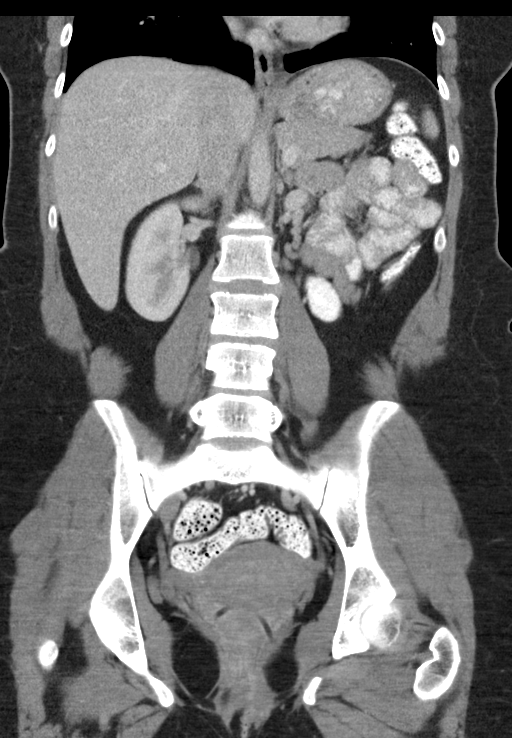

[16 of 46 positions shown; findings below may reference images not displayed]

FINDINGS: Lower chest: Clear lung bases. Normal heart size without pericardial
or pleural effusion.

Hepatobiliary: Normal liver. Normal gallbladder, without biliary
ductal dilatation.

Pancreas: Normal, without mass or ductal dilatation.

Spleen: Normal in size, without focal abnormality.

Adrenals/Urinary Tract: Normal adrenal glands. Normal kidneys,
without hydronephrosis. Normal urinary bladder.

Stomach/Bowel: Normal stomach, without wall thickening. Normal
colon, appendix, and terminal ileum. Normal small bowel.

Vascular/Lymphatic: Normal caliber of the aorta and branch vessels.
No abdominopelvic adenopathy.

Reproductive: Retroverted uterus. No adnexal mass. Vaginal birth
control ring in place.

Other: No significant free fluid.

Musculoskeletal: No acute osseous abnormality.
IMPRESSION: No acute process in the abdomen or pelvis.

## 2017-04-12 ENCOUNTER — Ambulatory Visit (INDEPENDENT_AMBULATORY_CARE_PROVIDER_SITE_OTHER): Payer: Self-pay | Admitting: Family Medicine

## 2017-04-12 ENCOUNTER — Telehealth: Payer: Self-pay | Admitting: Family Medicine

## 2017-04-12 ENCOUNTER — Encounter: Payer: Self-pay | Admitting: Family Medicine

## 2017-04-12 ENCOUNTER — Ambulatory Visit (HOSPITAL_COMMUNITY)
Admission: RE | Admit: 2017-04-12 | Discharge: 2017-04-12 | Disposition: A | Discharge status: Home / Self Care | Payer: Self-pay | Source: Ambulatory Visit | Attending: Family Medicine | Admitting: Family Medicine

## 2017-04-12 VITALS — BP 100/60 | HR 66 | Temp 98.1°F | Ht 63.0 in | Wt 183.6 lb

## 2017-04-12 DIAGNOSIS — M5412 Radiculopathy, cervical region: Secondary | ICD-10-CM | POA: Insufficient documentation

## 2017-04-12 DIAGNOSIS — M5010 Cervical disc disorder with radiculopathy, unspecified cervical region: Secondary | ICD-10-CM

## 2017-04-12 DIAGNOSIS — S29011A Strain of muscle and tendon of front wall of thorax, initial encounter: Secondary | ICD-10-CM

## 2017-04-12 MED ORDER — CYCLOBENZAPRINE HCL 10 MG PO TABS: 10.0000 mg | ORAL_TABLET | Freq: Three times a day (TID) | ORAL | 0 refills | Status: DC | PRN

## 2017-04-12 NOTE — Progress Notes (Signed)
      Subjective:  Colleen Daniels is a 26 y.o. female who [facility] with a chief complaint of chest pain  HPI:  CHEST PAIN  Chest pain began 5 days ago. Pain is: sharp pain and hurts to breath and feels heavy in her left arm and sometimes gets numb going down the arm How severe is the pain: 8.5/10 What worsens the pain: hurts at rest and with movement What makes the pain better: tried ibuprofen without improvement Radiation: down left arm   PMH History of blood clot or heart problems or aneurysms: none  Symptoms Nausea/vomiting: no Shortness of breath: no Pleuritic pain: yes Cough: no Swelling of legs: no Syncope: ? Reports dizziness Heart burn or food sticking: no Immobility: no recent travel or periods of prolonged immobility  PMH: no Tobacco use: no Medication: reviewed and updated ROS: see HPI   Objective:  Physical Exam: BP 100/60   Pulse 66   Temp 98.1 F (36.7 C) (Oral)   Ht 5\' 3"  (1.6 m)   Wt 183 lb 9.6 oz (83.3 kg)   LMP 04/10/2017   SpO2 99%   BMI 32.52 kg/m   Gen: 25yo F in NAD, resting comfortably CV: RRR with no murmurs appreciated Pulm: NWOB, CTAB with no crackles, wheezes, or rhonchi GI: Normal bowel sounds present. Soft, Nontender, Nondistended. MSK: no edema, cyanosis, or clubbing noted. TTP in region of pectoralis major on left side. Worse with overhead movement and shoulder adduction. TTP at sternocleidomastoid L side and L trapezius. TTP at C-spine and pos spurlings.  Skin: warm, dry Neuro: grossly normal, moves all extremities Psych: Normal affect and thought content  No results found for this or any previous visit (from the past 72 hour(s)).   Assessment/Plan:  Cervical radiculopathy TTP in c-spine with radiculopathy. No numbness, tingling or weakness and pos spurlings. Placed order for xray c-spine. Will call patient to discuss results. Return precautions discussed.   Chest pain TTP at pectoralis major  muscle. Worse with shoulder adduction and overhead movement. Wells score of 0 and no exertional chest pain. No known FH of heart disease. Very unlikely to be cardiac in origin. Rx for flexeril 10mg  TID PRN. Also recommending avoiding aggravating movements and PRN ibuprofen. Return precautions discussed.   Daniel L. Myrtie SomanWarden, MD [facility] Resident PGY-2 04/12/2017 12:57 PM

## 2017-04-12 NOTE — Telephone Encounter (Signed)
Called patient to let her know about negative c-spine. Discussed return precautions and asked to her to schedule an appointment in 2 weeks.   Dawson Hollman L. Myrtie SomanWarden, MD [facility] Resident PGY-2 04/12/2017 3:20 PM

## 2017-04-12 NOTE — Assessment & Plan Note (Addendum)
TTP in c-spine with radiculopathy. No numbness, tingling or weakness and pos spurlings. Placed order for xray c-spine. Will call patient to discuss results. Return precautions discussed.

## 2017-04-12 NOTE — Patient Instructions (Signed)
You most likely strained a muscle in your chest when you picked up that heavy water jug.  You also have numbness down your left arm and tenderness on your spine in your neck.  I want to get an xray to make sure everything is ok.   In the meantime I have prescribed you some muscle relaxer. You can also take ibuprofen as needed to help with the discomfort.  I will call you with the results from your xray.  Take care, Daniel L. Myrtie SomanWarden, MD [facility] Resident PGY-2 04/12/2017 11:49 AM

## 2017-04-14 ENCOUNTER — Telehealth: Payer: Self-pay | Admitting: *Deleted

## 2017-04-14 NOTE — Telephone Encounter (Signed)
Patient lm on nurse line and  states that the med she was given on Tuesday makes her very sleepy and drowsy.  She young children and needs to be alert. Will forward to MD Ariella Voit, Maryjo RochesterJessica Dawn, CMA

## 2017-04-15 ENCOUNTER — Other Ambulatory Visit: Payer: Self-pay | Admitting: Family Medicine

## 2017-04-15 DIAGNOSIS — S29011D Strain of muscle and tendon of front wall of thorax, subsequent encounter: Secondary | ICD-10-CM

## 2017-04-15 MED ORDER — BACLOFEN 10 MG PO TABS: 10.0000 mg | ORAL_TABLET | Freq: Three times a day (TID) | ORAL | 0 refills | Status: DC

## 2018-09-20 ENCOUNTER — Other Ambulatory Visit: Payer: Self-pay

## 2018-09-20 ENCOUNTER — Telehealth: Payer: Self-pay | Admitting: Family Medicine

## 2018-09-20 ENCOUNTER — Ambulatory Visit (INDEPENDENT_AMBULATORY_CARE_PROVIDER_SITE_OTHER): Payer: Self-pay | Admitting: Family Medicine

## 2018-09-20 DIAGNOSIS — N912 Amenorrhea, unspecified: Secondary | ICD-10-CM

## 2018-09-20 DIAGNOSIS — R42 Dizziness and giddiness: Secondary | ICD-10-CM

## 2018-09-20 LAB — POCT URINE PREGNANCY: Preg Test, Ur: NEGATIVE

## 2018-09-20 NOTE — Progress Notes (Signed)
   CC: dizziness   HPI  Has been having daily HA x 1 month. Has dizzy spells for about 2 weeks, which make her have to lie down. She feels better lying down, then when she sits up again it starts again. She also feels like she has a lump behind her R eye. This morning she also had ringing in her both ears. Dizzy spells are not associated with headache. Dizzy spells are feeling like she might pass out. Has had ringing in both ears with these. Denies decreased heading. Never had anything similar before. Felt her heart was racing at the time of the dizzy spell. No blood in BMs. No change to menses. Eats 3-4 meals per day. Drinks water and 1 cup coffee. Drinks 2 bottles of water per day. Dizziness doesn't seem to be associated with getting up from somewhere. Can happen lying down as well.   Med hx reports IBS. Taking low dose OCPs. Hopes she is not pregnant.   ROS: Denies CP, SOB, abdominal pain, dysuria, changes in BMs.   CC, SH/smoking status, and VS noted  Objective: Vitals will not autopopulate, see flow sheet. Orthostatics normal.  Gen: NAD, alert, cooperative, and pleasant. HEENT: NCAT, EOMI, PERRL, TMs and canals normal bilaterally.  CV: RRR, no murmur Resp: CTAB, no wheezes, non-labored Abd: SNTND, BS present, no guarding or organomegaly Ext: No edema, warm Neuro: Alert and oriented, Speech clear, No gross deficits, CN II-XII intact. Dix hallpike negative.   Assessment and plan:  Dizziness: unclear etiology. Upreg negative. Orthostatic vitals reassuring. Question Meniere's disease, although lack of hearing loss would be nonclassical. We will get labs to rule out electrolyte or anemia causes, then could potentially trial meclizine despite a negative dix hallpike. Patient does seem anxious in general, and describes body tingling prior to dizzy spells, which makes me question somatic sxs of anxiety. Encouraged her to drink more water.   Orders Placed This Encounter  Procedures  . CBC   . TSH  . POCT urine pregnancy    No orders of the defined types were placed in this encounter.   Loni Muse, MD, PGY3 09/21/2018 4:09 PM

## 2018-09-20 NOTE — Telephone Encounter (Signed)
Patient forwarded to me after talking with nurse.  Patient has been having lightheadedness that is been increasing in intensity over the past week.  She had episode where she had to lay down on the floor yesterday because she became so lightheaded.  No actual syncope.  No loss of consciousness.  She reports that she has a history of low blood pressure.  Her mother has a blood pressure machine which she checks regularly.  Runs anywhere from 80s to 90 systolic per her report.  She has been isolating herself at home along with her family to stay safe and therefore has not checked her blood pressure over the past several weeks.  She denies any cough.  No URI symptoms except for allergy itchy nose.  She has been having regular periods without increased bleeding.  No hematochezia.  No nausea or vomiting.  She is otherwise eating and drinking well.  She is not taking any daily medicines.  She is only on vitamins.  After talking with her I think it would be best to be evaluated this afternoon.  Come in for blood pressure check as well as possible labs/hemoglobin check.  Patient agreeable.  Scheduled today at 130 for access to care.

## 2018-09-20 NOTE — Patient Instructions (Signed)
It was a pleasure to see you today! Thank you for choosing [facility] for your primary care. Colleen Daniels was seen for dizzy spells.   Our plans for today were:  We will check lab work.   Please try to drink at least 4 bottles of water per day and eat regular meals.   We will call you with results.    Best,  Dr. Chanetta Marshall

## 2018-09-21 ENCOUNTER — Telehealth: Payer: Self-pay | Admitting: Family Medicine

## 2018-09-21 LAB — CBC
Hematocrit: 39.5 % (ref 34.0–46.6)
Hemoglobin: 13.1 g/dL (ref 11.1–15.9)
MCH: 28.9 pg (ref 26.6–33.0)
MCHC: 33.2 g/dL (ref 31.5–35.7)
MCV: 87 fL (ref 79–97)
Platelets: 360 10*3/uL (ref 150–450)
RBC: 4.54 x10E6/uL (ref 3.77–5.28)
RDW: 12.7 % (ref 11.7–15.4)
WBC: 7.6 10*3/uL (ref 3.4–10.8)

## 2018-09-21 LAB — TSH: TSH: 2.72 u[IU]/mL (ref 0.450–4.500)

## 2018-09-21 MED ORDER — MECLIZINE HCL 25 MG PO TABS: 25.0000 mg | ORAL_TABLET | Freq: Three times a day (TID) | ORAL | 0 refills | Status: DC | PRN

## 2018-09-21 NOTE — Telephone Encounter (Signed)
Patient was seen in the office yesterday afternoon for dizziness, calls today with complaints of same.  Patient states she is still getting dizzy, reports her dizziness feels like the room is spinning with associated ringing in her ears, nausea and headaches.  She denies vomiting, fevers, changes in menses or bowel habits.  Denies loss of consciousness or head trauma.  During her visit yesterday, orthostatics were negative with normal neurologic and cardiac exams.  Dix-Hallpike negative. CBC, TSH normal.  UPreg negative.   She now reports she began having chest pressure, stabbing in nature with left arm tingling and numbness that happened about 10 minutes prior to calling.  The pain came on while laying down.  She denies any difficulties breathing.  Does report her heart racing some.  Is on OCPs.  No recent immobilization or surgeries.  Does not smoke.  No significant past medical history with unknown family history of cardiac issues per patient.  Explained to patient that given she is having new and persistent symptoms, she likely would need to be reevaluated.  Dizziness, nausea, ringing in her ears consistent with possible vertigo symptoms although would not explain new chest pressure.  Chest pain atypical in nature and likely not cardiac etiology however cannot rule out at this time.  Offered patient options regarding evaluation in the ED versus following up in clinic tomorrow, patient elected to be seen in the clinic tomorrow afternoon.  Appointment made.  Given patient's dizziness, nausea, ringing in her ears are consistent with possible vertigo, will provide patient meclizine.  Emergency precautions provided to patient, she verbalized understanding.  Precepted with Dr. Laureen Ochs.  Ellwood Dense, DO PGY-2, [facility] 09/21/2018 4:27 PM

## 2018-09-22 ENCOUNTER — Ambulatory Visit (INDEPENDENT_AMBULATORY_CARE_PROVIDER_SITE_OTHER): Payer: Self-pay | Admitting: Family Medicine

## 2018-09-22 ENCOUNTER — Other Ambulatory Visit: Payer: Self-pay

## 2018-09-22 DIAGNOSIS — H8110 Benign paroxysmal vertigo, unspecified ear: Secondary | ICD-10-CM

## 2018-09-22 NOTE — Progress Notes (Signed)
  Subjective:   Patient ID: Colleen Daniels    DOB: [birth year], 28 y.o. female   MRN: [id number]  Colleen Daniels is a 28 y.o. female with no significant PMH here for   Dizziness - Seen 4/1 for dizziness which was described as both lightheadedness as well as the room spinning with associated ringing in her ears, nausea and headaches. She denied vomiting, fevers, changes in menses or bowel habits.  Denies loss of consciousness or head trauma.  During that visit, orthostatics were negative with normal neurologic and cardiac exams.  Dix-Hallpike that time negative. CBC, TSH normal.  UPreg negative. - Telephone encounter 4/2 with persistent dizziness with additional stabbing chest pressure with left arm tingling and numbness that happened about 10 minutes prior to call while laying down.  At that time denied any difficulties breathing.  Does report her heart racing some.  Is on OCPs.  No recent immobilization or surgeries.  Does not smoke.  No significant past medical history with unknown family history of cardiac issues per patient.  - Prescribed meclizine after telephone encounter 4/2. Has not been able to pick this up form the pharmacy yet. -Today she reports her dizziness is the same.  Reports her dizziness worsens with looking down or in different directions.  Still with associated nausea and headache. - Reports her chest pressure is now gone.  It lasted about 40 mins and went away with advil and hasn't come back.    Review of Systems:  Per HPI.  PMFSH, medications and smoking status reviewed.  Objective:   BP 100/62   Pulse 70   Temp 98.5 F (36.9 C) (Oral)   Wt 168 lb 4 oz (76.3 kg)   SpO2 98%   BMI 29.80 kg/m  Vitals and nursing note reviewed.  General: well nourished, well developed, in no acute distress with non-toxic appearance.  Slightly anxious appearing. HEENT: normocephalic, atraumatic, moist mucous membranes.  TMs clear bilaterally.  No battle sign. Neck:  supple, non-tender without lymphadenopathy CV: regular rate and rhythm without murmurs, rubs, or gallops Lungs: clear to auscultation bilaterally with normal work of breathing Skin: warm, dry, no rashes or lesions Extremities: warm and well perfused, normal tone MSK: ROM grossly intact, strength 5/5 to U/LE bilaterally, normal gait.  No edema.  Neuro: Alert and oriented, speech normal.  Romberg negative.  Extraocular movements intact, dizziness worsened with EOM testing, no nystagmus.  Intact symmetric sensation to light touch of face bilaterally.  Hearing grossly intact bilaterally.  Tongue protrudes normally with no deviation.  Shoulder shrug, smile symmetric.   Assessment & Plan:   BPPV (benign paroxysmal positional vertigo) Symptoms and exam consistent with BPPV.  No cardiac abnormalities appreciated on exam, chest pressure now resolved.  No need for further work-up at this time.  Patient neurologically intact and with dizziness worsened by rapid EOM testing and changing of positions although no nystagmus appreciated.  Will trial meclizine.  If patient continues to have symptoms could consider vestibular PT.  No orders of the defined types were placed in this encounter.  No orders of the defined types were placed in this encounter.   Ellwood Dense, DO PGY-2,  Family [facility] 09/22/2018 5:10 PM

## 2018-09-22 NOTE — Patient Instructions (Signed)
It was great to see you!  Our plans for today:  - Take the meclizine for your dizziness, if you don't see relief in about 2-3 doses, let us know.  Take care and seek immediate care sooner if you develop any concerns.   Dr. [facility]  Vertigo Vertigo is the feeling that you or your surroundings are moving when they are not. Vertigo can be dangerous if it occurs while you are doing something that could endanger you or others, such as driving. What are the causes? This condition is caused by a disturbance in the signals that are sent by your body's sensory systems to your brain. Different causes of a disturbance can lead to vertigo, including:  Infections, especially in the inner ear.  A bad reaction to a drug, or misuse of alcohol and medicines.  Withdrawal from drugs or alcohol.  Quickly changing positions, as when lying down or rolling over in bed.  Migraine headaches.  Decreased blood flow to the brain.  Decreased blood pressure.  Increased pressure in the brain from a head or neck injury, stroke, infection, tumor, or bleeding.  Central nervous system disorders. What are the signs or symptoms? Symptoms of this condition usually occur when you move your head or your eyes in different directions. Symptoms may start suddenly, and they usually last for less than a minute. Symptoms may include:  Loss of balance and falling.  Feeling like you are spinning or moving.  Feeling like your surroundings are spinning or moving.  Nausea and vomiting.  Blurred vision or double vision.  Difficulty hearing.  Slurred speech.  Dizziness.  Involuntary eye movement (nystagmus). Symptoms can be mild and cause only slight annoyance, or they can be severe and interfere with daily life. Episodes of vertigo may return (recur) over time, and they are often triggered by certain movements. Symptoms may improve over time. How is this diagnosed? This condition may be  diagnosed based on medical history and the quality of your nystagmus. Your health care provider may test your eye movements by asking you to quickly change positions to trigger the nystagmus. This may be called the Dix-Hallpike test, head thrust test, or roll test. You may be referred to a health care provider who specializes in ear, nose, and throat (ENT) problems (otolaryngologist) or a provider who specializes in disorders of the central nervous system (neurologist). You may have additional testing, including:  A physical exam.  Blood tests.  MRI.  A CT scan.  An electrocardiogram (ECG). This records electrical activity in your heart.  An electroencephalogram (EEG). This records electrical activity in your brain.  Hearing tests. How is this treated? Treatment for this condition depends on the cause and the severity of the symptoms. Treatment options include:  Medicines to treat nausea or vertigo. These are usually used for severe cases. Some medicines that are used to treat other conditions may also reduce or eliminate vertigo symptoms. These include: ? Medicines that control allergies (antihistamines). ? Medicines that control seizures (anticonvulsants). ? Medicines that relieve depression (antidepressants). ? Medicines that relieve anxiety (sedatives).  Head movements to adjust your inner ear back to normal. If your vertigo is caused by an ear problem, your health care provider may recommend certain movements to correct the problem.  Surgery. This is rare. Follow these instructions at home: Safety  Move slowly.Avoid sudden body or head movements.  Avoid driving.  Avoid operating heavy machinery.  Avoid doing any tasks that would cause danger to you  or others if you would have a vertigo episode during the task.  If you have trouble walking or keeping your balance, try using a cane for stability. If you feel dizzy or unstable, sit down right away.  Return to your normal  activities as told by your health care provider. Ask your health care provider what activities are safe for you. General instructions  Take over-the-counter and prescription medicines only as told by your health care provider.  Avoid certain positions or movements as told by your health care provider.  Drink enough fluid to keep your urine clear or pale yellow.  Keep all follow-up visits as told by your health care provider. This is important. Contact a health care provider if:  Your medicines do not relieve your vertigo or they make it worse.  You have a fever.  Your condition gets worse or you develop new symptoms.  Your family or friends notice any behavioral changes.  Your nausea or vomiting gets worse.  You have numbness or a "pins and needles" sensation in part of your body. Get help right away if:  You have difficulty moving or speaking.  You are always dizzy.  You faint.  You develop severe headaches.  You have weakness in your hands, arms, or legs.  You have changes in your hearing or vision.  You develop a stiff neck.  You develop sensitivity to light. This information is not intended to replace advice given to you by your health care provider. Make sure you discuss any questions you have with your health care provider. Document Released: 03/17/2005 Document Revised: 11/19/2015 Document Reviewed: 09/30/2014 Elsevier Interactive Patient Education  2019 [employer].

## 2018-09-22 NOTE — Assessment & Plan Note (Signed)
Symptoms and exam consistent with BPPV.  No cardiac abnormalities appreciated on exam, chest pressure now resolved.  No need for further work-up at this time.  Patient neurologically intact and with dizziness worsened by rapid EOM testing and changing of positions although no nystagmus appreciated.  Will trial meclizine.  If patient continues to have symptoms could consider vestibular PT.

## 2018-10-20 DIAGNOSIS — U071 COVID-19: Secondary | ICD-10-CM

## 2018-10-20 HISTORY — DX: COVID-19: U07.1

## 2018-11-20 ENCOUNTER — Telehealth (INDEPENDENT_AMBULATORY_CARE_PROVIDER_SITE_OTHER): Payer: Self-pay | Admitting: Family Medicine

## 2018-11-20 ENCOUNTER — Other Ambulatory Visit: Payer: Self-pay

## 2018-11-20 DIAGNOSIS — R059 Cough, unspecified: Secondary | ICD-10-CM

## 2018-11-20 DIAGNOSIS — R05 Cough: Secondary | ICD-10-CM | POA: Insufficient documentation

## 2018-11-20 DIAGNOSIS — J988 Other specified respiratory disorders: Secondary | ICD-10-CM

## 2018-11-20 DIAGNOSIS — U071 COVID-19: Secondary | ICD-10-CM | POA: Insufficient documentation

## 2018-11-20 MED ORDER — ACETAMINOPHEN-CODEINE #3 300-30 MG PO TABS: 1.0000 | ORAL_TABLET | ORAL | 0 refills | Status: DC | PRN

## 2018-11-20 NOTE — Assessment & Plan Note (Addendum)
Due to COVID-19.  No red flags.  Primary issue has been nonproductive cough, however she is without respiratory distress. - Reviewed return precautions and reasons to call 911 - Advised patient to remain in isolation for full 14 days following testing - Given short-term prescription with Tylenol #3 advised to supplement with ibuprofen as needed (she has a friend who will pick up medication)

## 2018-11-20 NOTE — Progress Notes (Signed)
[facility] Telemedicine Visit  Patient consented to have virtual visit. Method of visit: Telephone  Encounter participants: Patient: Colleen Daniels - located at home  Provider: Wendee Beavers - located at office  Chief Complaint: "I have COVID-19"  HPI:  Ms. Colleen Daniels is calling today due to persistent dry coughing since being diagnosed with COVID 19, 5 days ago.  She was tested 8 days ago since the onset of her symptoms.  Her husband who is been living with her has also developed similar symptoms and has been tested earlier today.  They have 3 children, however husband has been taking care of them since Ms. Colleen Daniels is been in isolation.  Her children have been without symptoms.  She says her whole family have been quarantining themselves at the house.  Her primary complaint is her chronic nonproductive cough.  She does also report fevers and chills with thoracic back pain.  She does have some feelings of shortness of breath limited to her cough, but otherwise has been without difficulty breathing.  He does have some sharp chest pain associated with the cough.  She has been taking some Delsym without improvement along with honey with lemon and baby aspirin.   ROS: per HPI  Pertinent PMHx: reviewed  Exam:  Respiratory: speaking in complete sentences with frequent coughing  Assessment/Plan:  Cough Due to COVID-19.  No red flags.  Primary issue has been nonproductive cough, however she is without respiratory distress. - Reviewed return precautions and reasons to call 911 - Advised patient to remain in isolation for full 14 days following testing - Given short-term prescription with Tylenol #3 advised to supplement with ibuprofen as needed (she has a friend who will pick up medication)    Time spent during visit with patient: 15 minutes

## 2018-11-27 ENCOUNTER — Other Ambulatory Visit: Payer: Self-pay

## 2018-11-27 ENCOUNTER — Encounter: Payer: Self-pay | Admitting: Family Medicine

## 2018-11-27 ENCOUNTER — Telehealth: Payer: Self-pay

## 2018-11-27 ENCOUNTER — Telehealth (INDEPENDENT_AMBULATORY_CARE_PROVIDER_SITE_OTHER): Payer: Self-pay | Admitting: Family Medicine

## 2018-11-27 DIAGNOSIS — U071 COVID-19: Secondary | ICD-10-CM

## 2018-11-27 DIAGNOSIS — J988 Other specified respiratory disorders: Secondary | ICD-10-CM

## 2018-11-27 MED ORDER — BENZONATATE 200 MG PO CAPS: 200.0000 mg | ORAL_CAPSULE | Freq: Two times a day (BID) | ORAL | 0 refills | Status: DC | PRN

## 2018-11-27 NOTE — Progress Notes (Signed)
Agrees to phone visit Initially sick on May 22.  Tested positive for COVID May 24 test was done. Cough and chest pain still Improving in that no chills, fever.  Only short of breath with bad coughing fit.  Doing well with self isolation. Husband recently sick and did test positive for COVID.   Cough medication is not working.  Medication is a codeine containing cough medicine.  Three children, none have any respiratory symptoms. Duration of visit was 16 minutes.

## 2018-11-27 NOTE — Assessment & Plan Note (Signed)
Clearly, she is clinically improving and in that she is beyond 2 weeks of illness, she is highly unlikely to worsen at this point.  No further testing.  Will try tessalon pearles in addition to her codeine containing cough medications.  Continue isolation and include isolation of all family members.

## 2018-11-30 ENCOUNTER — Emergency Department (HOSPITAL_COMMUNITY)
Admission: EM | Admit: 2018-11-30 | Discharge: 2018-11-30 | Disposition: A | Discharge status: Home / Self Care | Payer: Self-pay | Attending: Emergency Medicine | Admitting: Emergency Medicine

## 2018-11-30 ENCOUNTER — Other Ambulatory Visit: Payer: Self-pay

## 2018-11-30 ENCOUNTER — Emergency Department (HOSPITAL_COMMUNITY): Payer: Self-pay

## 2018-11-30 ENCOUNTER — Telehealth (INDEPENDENT_AMBULATORY_CARE_PROVIDER_SITE_OTHER): Payer: Self-pay | Admitting: Family Medicine

## 2018-11-30 DIAGNOSIS — Z8709 Personal history of other diseases of the respiratory system: Secondary | ICD-10-CM | POA: Insufficient documentation

## 2018-11-30 DIAGNOSIS — J189 Pneumonia, unspecified organism: Secondary | ICD-10-CM | POA: Insufficient documentation

## 2018-11-30 DIAGNOSIS — R0602 Shortness of breath: Secondary | ICD-10-CM | POA: Insufficient documentation

## 2018-11-30 DIAGNOSIS — Z793 Long term (current) use of hormonal contraceptives: Secondary | ICD-10-CM | POA: Insufficient documentation

## 2018-11-30 DIAGNOSIS — Z20828 Contact with and (suspected) exposure to other viral communicable diseases: Secondary | ICD-10-CM | POA: Insufficient documentation

## 2018-11-30 DIAGNOSIS — R05 Cough: Secondary | ICD-10-CM | POA: Insufficient documentation

## 2018-11-30 DIAGNOSIS — Z79899 Other long term (current) drug therapy: Secondary | ICD-10-CM | POA: Insufficient documentation

## 2018-11-30 LAB — COMPREHENSIVE METABOLIC PANEL
ALT: 22 U/L (ref 0–44)
AST: 18 U/L (ref 15–41)
Albumin: 3.3 g/dL — ABNORMAL LOW (ref 3.5–5.0)
Alkaline Phosphatase: 57 U/L (ref 38–126)
Anion gap: 10 (ref 5–15)
BUN: 9 mg/dL (ref 6–20)
CO2: 19 mmol/L — ABNORMAL LOW (ref 22–32)
Calcium: 8.9 mg/dL (ref 8.9–10.3)
Chloride: 109 mmol/L (ref 98–111)
Creatinine, Ser: 0.7 mg/dL (ref 0.44–1.00)
GFR calc Af Amer: >60 mL/min (ref >60)
GFR calc non Af Amer: >60 mL/min (ref >60)
Glucose, Bld: 88 mg/dL (ref 70–99)
Potassium: 3.6 mmol/L (ref 3.5–5.1)
Sodium: 138 mmol/L (ref 135–145)
Total Bilirubin: 1.2 mg/dL (ref 0.3–1.2)
Total Protein: 7.1 g/dL (ref 6.5–8.1)

## 2018-11-30 LAB — I-STAT BETA HCG BLOOD, ED (MC, WL, AP ONLY): I-stat hCG, quantitative: <5 m[IU]/mL (ref <5)

## 2018-11-30 LAB — CBC WITH DIFFERENTIAL/PLATELET
Abs Immature Granulocytes: 0.02 10*3/uL (ref 0.00–0.07)
Basophils Absolute: 0 10*3/uL (ref 0.0–0.1)
Basophils Relative: 1 %
Eosinophils Absolute: 0.1 10*3/uL (ref 0.0–0.5)
Eosinophils Relative: 1 %
HCT: 38.8 % (ref 36.0–46.0)
Hemoglobin: 12.8 g/dL (ref 12.0–15.0)
Immature Granulocytes: 0 %
Lymphocytes Relative: 21 %
Lymphs Abs: 1.7 10*3/uL (ref 0.7–4.0)
MCH: 27.9 pg (ref 26.0–34.0)
MCHC: 33 g/dL (ref 30.0–36.0)
MCV: 84.7 fL (ref 80.0–100.0)
Monocytes Absolute: 0.6 10*3/uL (ref 0.1–1.0)
Monocytes Relative: 7 %
Neutro Abs: 5.6 10*3/uL (ref 1.7–7.7)
Neutrophils Relative %: 70 %
Platelets: 454 10*3/uL — ABNORMAL HIGH (ref 150–400)
RBC: 4.58 MIL/uL (ref 3.87–5.11)
RDW: 12.6 % (ref 11.5–15.5)
WBC: 7.9 10*3/uL (ref 4.0–10.5)
nRBC: 0 % (ref 0.0–0.2)

## 2018-11-30 LAB — SARS CORONAVIRUS 2: SARS Coronavirus 2: NOT DETECTED

## 2018-11-30 MED ORDER — ALBUTEROL SULFATE HFA 108 (90 BASE) MCG/ACT IN AERS
2.0000 | INHALATION_SPRAY | Freq: Once | RESPIRATORY_TRACT | Status: AC
  Administered 2018-11-30: 2 via RESPIRATORY_TRACT
  Filled 2018-11-30: qty 6.7

## 2018-11-30 MED ORDER — ACETAMINOPHEN 500 MG PO TABS
1000.0000 mg | ORAL_TABLET | Freq: Once | ORAL | Status: AC
  Administered 2018-11-30: 1000 mg via ORAL
  Filled 2018-11-30: qty 2

## 2018-11-30 MED ORDER — IOHEXOL 350 MG/ML SOLN
100.0000 mL | Freq: Once | INTRAVENOUS | Status: AC | PRN
  Administered 2018-11-30: 19:00:00 100 mL via INTRAVENOUS

## 2018-11-30 MED ORDER — LEVOFLOXACIN 750 MG PO TABS: 750.0000 mg | ORAL_TABLET | Freq: Every day | ORAL | 0 refills | Status: DC

## 2018-11-30 MED ORDER — AEROCHAMBER PLUS FLO-VU LARGE MISC
Status: AC
  Filled 2018-11-30: qty 1

## 2018-11-30 MED ORDER — BENZONATATE 100 MG PO CAPS: 100.0000 mg | ORAL_CAPSULE | Freq: Three times a day (TID) | ORAL | 0 refills | Status: DC

## 2018-11-30 NOTE — Assessment & Plan Note (Signed)
Patient has worsening shortness of breath and chronic cough. She has now developed chest pain with deep breathing concerning for possible developing pneumonia. Given worsening respiratory status and likely need for CXR would recommend going to the ED for further evaluation. Patient may need admission if continue to worsen.

## 2018-11-30 NOTE — Progress Notes (Signed)
T- 97.1 BP- N/A WT- [place]

## 2018-11-30 NOTE — ED Notes (Signed)
O2 via nasal cannula at 1L/min applied to facilitate breathing and minimize SOB.

## 2018-11-30 NOTE — ED Triage Notes (Signed)
Pt arrived pov from home c/o increasing SOB this am. Pt has had cough. Pt is alert and oriented at time of triage.

## 2018-11-30 NOTE — ED Notes (Signed)
Patient verbalizes understanding of discharge instructions. Opportunity for questioning and answers were provided. Armband removed by staff, pt discharged from ED.  

## 2018-11-30 NOTE — Progress Notes (Signed)
[place] Telemedicine Visit  Patient consented to have virtual visit. Method of visit: Video  Encounter participants: Patient: Colleen Daniels - located at Home  Provider: Marjie Skiff - located at [facility] Others (if applicable): None  Chief Complaint: worsening SOB  HPI: Patient is a 28 yo femal with a recent diagnosis of Covid (5/24) who is calling in today to discuss worsening shortness of breath. Patient reports she has had more shallow breathing, shortness of breath and pain with deep breathing. She continues to endorse significant non productive cough for which she has been taking tessalon Perles and a codeine based cough syrup. Patient is concerned about her worsening SOB of breath. She denies any fevers, but is feeling tired and drained. Husband was also diagnosed with Covid.   ROS: per HPI  Pertinent PMHx: recent diagnosis of Covid  Exam:  Respiratory: Shallow breathing, worsening SOB, Chest pain with deep breathing  Assessment/Plan:  SOB (shortness of breath) Patient has worsening shortness of breath and chronic cough. She has now developed chest pain with deep breathing concerning for possible developing pneumonia. Given worsening respiratory status and likely need for CXR would recommend going to the ED for further evaluation. Patient may need admission if continue to worsen.    Time spent during visit with patient: 5 minutes

## 2018-11-30 NOTE — ED Provider Notes (Signed)
[facility]GENCY [facility] Provider Note   CSN: [id number] Arrival date & time: 11/30/18  1226    History   Chief Complaint Chief Complaint  Patient presents with   Shortness of Breath    HPI Colleen Daniels is a 28 y.o. female history of hepatitis B, recently diagnosed with COVID-19 who presents for evaluation of progressively worsening shortness of breath x1 week.  Patient reports that she was recently diagnosed with COVID-19 on 11/12/18.  Since then, she has been at home managing her symptoms.  She reports that she did a telemedicine visit with her doctor who prescribed her medication for her cough.  Patient states she has been taking it.  She does not think she has been on any antibiotics.  She noticed about a week ago, she started having some mild shortness of breath.  She states that that time, is mostly associated with her cough.  She states cough is not productive of phlegm or hemoptysis.  Patient states that over the last 2 days, the shortness of breath has worsened.  She states that when she is sitting down, she feel like she cannot take a deep breath and when she gets up and ambulates, her shortness of breath increases.  She also reports some pain with deep inspiration.  No chest pain otherwise.  States that she has no chest pain with exertion or associated diaphoresis.  She has not noted any fevers.  Patient states that she does not have any history of asthma or COPD.  She is not a current smoker.  Patient denies any nausea/vomiting, abdominal pain, urinary complaints.  Patient is on OCPs which she states she is still been taking.  She denies any history of blood clots in her legs or lungs, recent travel, recent surgeries, recent hospitalizations.  She does not use any other exogenous hormone.     The history is provided by the patient.  Shortness of Breath Associated symptoms: cough   Associated symptoms: no abdominal pain, no chest pain, no fever,  no headaches and no vomiting     Past Medical History:  Diagnosis Date   Hepatitis B    as a child   No pertinent past medical history     Patient Active Problem List   Diagnosis Date Noted   SOB (shortness of breath) 11/30/2018   Cough 11/20/2018   COVID-19 11/20/2018   BPPV (benign paroxysmal positional vertigo) 09/22/2018   Acute upper respiratory infection 04/16/2016   Left sided abdominal pain 01/21/2016    Past Surgical History:  Procedure Laterality Date   NO PAST SURGERIES       OB History    Gravida  3   Para  3   Term  3   Preterm  0   AB  0   Living  3     SAB  0   TAB  0   Ectopic  0   Multiple  0   Live Births  3            Home Medications    Prior to Admission medications   Medication Sig Start Date End Date Taking? Authorizing Provider  Multiple Vitamin (MULTIVITAMIN PO) Take 1 tablet by mouth daily.   Yes [provider]  SPRINTEC 28 0.25-35 MG-MCG tablet Take 1 tablet by mouth daily. 10/10/18  Yes [provider]  benzonatate (TESSALON) 100 MG capsule Take 1 capsule (100 mg total) by mouth every 8 (eight) hours. 11/30/18  Tran, Bowie, PA-C  levoFayrene Helperfloxacin (LEVAQUIN) 750 MG tablet Take 1 tablet (750 mg total) by mouth daily. X 7 days 11/30/18   Fayrene Helperran, Bowie, PA-C  pantoprazole (PROTONIX) 40 MG tablet Take 1 tablet (40 mg total) by mouth daily. Patient not taking: Reported on 11/30/2018 01/27/16 11/30/18  Berton BonMikell, Asiyah Zahra, MD    Family History Family History  Problem Relation Age of Onset   Diabetes Maternal Grandmother    Hypertension Maternal Grandmother     Social History Social History   Tobacco Use   Smoking status: Never Smoker   Smokeless tobacco: Never Used  Substance Use Topics   Alcohol use: No   Drug use: No     Allergies   Patient has no known allergies.   Review of Systems Review of Systems  Constitutional: Negative for fever.  Respiratory: Positive for cough and  shortness of breath.   Cardiovascular: Negative for chest pain and leg swelling.  Gastrointestinal: Negative for abdominal pain, nausea and vomiting.  Genitourinary: Negative for dysuria and hematuria.  Neurological: Negative for weakness, numbness and headaches.  All other systems reviewed and are negative.    Physical Exam Updated Vital Signs BP 110/62 (BP Location: Right Arm)    Pulse 62    Temp 98.3 F (36.8 C) (Oral)    Resp (!) 24    SpO2 100%   Physical Exam Vitals signs and nursing note reviewed.  Constitutional:      Appearance: Normal appearance. She is well-developed.     Comments: Appears uncomfortable  HENT:     Head: Normocephalic and atraumatic.  Eyes:     General: Lids are normal.     Conjunctiva/sclera: Conjunctivae normal.     Pupils: Pupils are equal, round, and reactive to light.  Neck:     Musculoskeletal: Full passive range of motion without pain.  Cardiovascular:     Rate and Rhythm: Normal rate and regular rhythm.     Pulses: Normal pulses.     Heart sounds: Normal heart sounds. No murmur. No friction rub. No gallop.   Pulmonary:     Effort: Pulmonary effort is normal. Tachypnea present.     Breath sounds: Normal breath sounds. No wheezing or rales.     Comments: Tachypnea with mild increased work of breathing noted.  No evidence of wheezing or rails.  She is speaking in medium-long sentences. Abdominal:     Palpations: Abdomen is soft. Abdomen is not rigid.     Tenderness: There is no abdominal tenderness. There is no guarding.     Comments: Lungs clear to auscultation bilaterally.  Symmetric chest rise.  No wheezing, rales, rhonchi.  Musculoskeletal: Normal range of motion.     Comments: Bilateral lower extremities are symmetric in appearance without any overlying warmth, erythema, edema.  Skin:    General: Skin is warm and dry.     Capillary Refill: Capillary refill takes less than 2 seconds.  Neurological:     Mental Status: She is alert and  oriented to person, place, and time.  Psychiatric:        Speech: Speech normal.      ED Treatments / Results  Labs (all labs ordered are listed, but only abnormal results are displayed) Labs Reviewed  COMPREHENSIVE METABOLIC PANEL - Abnormal; Notable for the following components:      Result Value   CO2 19 (*)    Albumin 3.3 (*)    All other components within normal limits  CBC WITH DIFFERENTIAL/PLATELET -  Abnormal; Notable for the following components:   Platelets 454 (*)    All other components within normal limits  SARS CORONAVIRUS 2  I-STAT BETA HCG BLOOD, ED ([facility], [facility], [facility] ONLY)    EKG None  Radiology Ct Angio Chest Pe W And/or Wo Contrast  Result Date: 11/30/2018 CLINICAL DATA:  Cough and shortness of breath. EXAM: CT ANGIOGRAPHY CHEST WITH CONTRAST TECHNIQUE: Multidetector CT imaging of the chest was performed using the standard protocol during bolus administration of intravenous contrast. Multiplanar CT image reconstructions and MIPs were obtained to evaluate the vascular anatomy. CONTRAST:  136mL OMNIPAQUE IOHEXOL 350 MG/ML SOLN COMPARISON:  None. FINDINGS: Cardiovascular: The heart size is normal. No substantial pericardial effusion. No thoracic aortic aneurysm. No filling defect within the opacified pulmonary arteries to suggest the presence of an acute pulmonary embolus. Mediastinum/Nodes: No mediastinal lymphadenopathy. Upper normal lymph nodes are seen in the hilar regions bilaterally. The esophagus has normal imaging features. There is no axillary lymphadenopathy. Lungs/Pleura: Right lung clear. Patchy airspace disease identified in the posterior left upper lobe and left lower lobe compatible with infectious/inflammatory alveolitis. Tiny left pleural effusion noted. Upper Abdomen: Unremarkable. Musculoskeletal: No worrisome lytic or sclerotic osseous abnormality. Review of the MIP images confirms the above findings. IMPRESSION: 1. No CT evidence for acute pulmonary embolus.  2. Patchy airspace opacity in the posterior left upper lobe and left lower lobe consistent with infection/inflammation. Electronically Signed   By: Misty Stanley M.D.   On: 11/30/2018 19:24   Dg Chest Portable 1 View  Result Date: 11/30/2018 CLINICAL DATA:  Cough with body aches. COVID-19 positive 2 weeks ago. EXAM: PORTABLE CHEST 1 VIEW COMPARISON:  03/26/2013 FINDINGS: Lungs are adequately inflated and otherwise clear. Cardiomediastinal silhouette and remainder of the exam is unchanged. IMPRESSION: No active disease. Electronically Signed   By: Marin Olp M.D.   On: 11/30/2018 13:23    Procedures Procedures (including critical care time)  Medications Ordered in ED Medications  albuterol (VENTOLIN HFA) 108 (90 Base) MCG/ACT inhaler 2 puff (2 puffs Inhalation Given 11/30/18 1930)  acetaminophen (TYLENOL) tablet 1,000 mg (1,000 mg Oral Given 11/30/18 1930)  iohexol (OMNIPAQUE) 350 MG/ML injection 100 mL (100 mLs Intravenous Contrast Given 11/30/18 1914)     Initial Impression / Assessment and Plan / ED Course  I have reviewed the triage vital signs and the nursing notes.  Pertinent labs & imaging results that were available during my care of the patient were reviewed by me and considered in my medical decision making (see chart for details).        28 year old female who presents for evaluation of shortness of breath x1 week.  Recently diagnosed with COVID-19.  Reports that about a week ago, she started having some mild shortness of breath that worsened today.  States it is worse with exertion the chest pain with deep inspiration.  No other chest pain.  She is on OCPs.  No history of blood clots.  No other PE risk factors.  On initial ED arrival, she is afebrile but is slightly tachypneic.  O2 sat was initially 95 on initial arrival.  She did drop down to 91 at the lowest.  Given her mild increased work of breathing, she was placed on 1 L O2 for comfort measures.  On exam, she does not have  any wheezing.  She is speaking in medium to long sentences but does exhibit signs of tachypnea.  Exam otherwise remarkable.  Concern for infectious etiology versus effects from COVID-19.  Additionally, consider PE given history of OCPs.  Will plan for labs, imaging.  That being negative.  CBC without any significant leukocytosis or anemia.  CMP is unremarkable.  Cover test is negative.  Chest x-ray negative for infectious etiology.  Given concerns for PE, will plan for CTA of chest.  Updated patient on plan via telephone.  She is agreeable.  Patient signed out to SidneyBowieTran, [place]-C with CTA pending.  If negative, plan to ambulate patient in the room with checking pulse ox.  If patient vitals are stable, plan to discharge home with albuterol and follow-up with primary care doctor.   Portions of this note were generated with Scientist, clinical (histocompatibility and immunogenetics)Dragon dictation software. Dictation errors may occur despite best attempts at proofreading.  Final Clinical Impressions(s) / ED Diagnoses   Final diagnoses:  SOB (shortness of breath)  Community acquired pneumonia of left lower lobe of lung [facility](HCC)    ED Discharge Orders         Ordered    levofloxacin (LEVAQUIN) 750 MG tablet  Daily     11/30/18 2022    benzonatate (TESSALON) 100 MG capsule  Every 8 hours     11/30/18 2022           Maxwell CaulLayden, Noreene Boreman A, PA-C 12/01/18 1500    Virgina NorfolkCuratolo, Adam, DO 12/04/18 [phone]

## 2018-11-30 NOTE — Discharge Instructions (Signed)
You have been diagnosed with pneumonia involving your left upper and lower lung.  Take antibiotic as prescribed.  Use albuterol inhaler 2 puffs every 4 hours as needed for shortness of breath.  Take tessalon for cough.  Continue to rest at home until your condition resolve.  Return if your symptoms worsen or if you have other concerns.

## 2018-11-30 NOTE — ED Provider Notes (Signed)
Received sign out at beginning of shift.  Pt was diagnosed with COVID-19 2 weeks ago.  She's here with c/o SOB.  COVID test today is negative.  She is on birth control pill therefore chest CTA obtained.  If negative, will ambulate and likely d/c with albuterol inhaler for sxs control.   8:19 PM Chest CTA showed no evidence of PE however patient does have evidence of pneumonia involving her left upper and lower lobe.  Currently O2 sats are 100% on room air.  Patient given albuterol inhaler to use as needed.  Patient discharged home with Levaquin as treatment of her pneumonia.  She is then to return promptly if her condition worsens.  I recommend patient to continue with home isolation  BP (!) 100/56   Pulse (!) 54   Temp 98.3 F (36.8 C) (Oral)   Resp 20   SpO2 100%   Results for orders placed or performed during the hospital encounter of 11/30/18  SARS Coronavirus 2  Result Value Ref Range   SARS Coronavirus 2 NOT DETECTED NOT DETECTED  Comprehensive metabolic panel  Result Value Ref Range   Sodium 138 135 - 145 mmol/L   Potassium 3.6 3.5 - 5.1 mmol/L   Chloride 109 98 - 111 mmol/L   CO2 19 (L) 22 - 32 mmol/L   Glucose, Bld 88 70 - 99 mg/dL   BUN 9 6 - 20 mg/dL   Creatinine, Ser 1.610.70 0.44 - 1.00 mg/dL   Calcium 8.9 8.9 - 09.610.3 mg/dL   Total Protein 7.1 6.5 - 8.1 g/dL   Albumin 3.3 (L) 3.5 - 5.0 g/dL   AST 18 15 - 41 U/L   ALT 22 0 - 44 U/L   Alkaline Phosphatase 57 38 - 126 U/L   Total Bilirubin 1.2 0.3 - 1.2 mg/dL   GFR calc non Af Amer >60 >60 mL/min   GFR calc Af Amer >60 >60 mL/min   Anion gap 10 5 - 15  CBC with Differential  Result Value Ref Range   WBC 7.9 4.0 - 10.5 K/uL   RBC 4.58 3.87 - 5.11 MIL/uL   Hemoglobin 12.8 12.0 - 15.0 g/dL   HCT 04.538.8 40.936.0 - 81.146.0 %   MCV 84.7 80.0 - 100.0 fL   MCH 27.9 26.0 - 34.0 pg   MCHC 33.0 30.0 - 36.0 g/dL   RDW 91.412.6 78.211.5 - 95.615.5 %   Platelets 454 (H) 150 - 400 K/uL   nRBC 0.0 0.0 - 0.2 %   Neutrophils Relative % 70 %   Neutro  Abs 5.6 1.7 - 7.7 K/uL   Lymphocytes Relative 21 %   Lymphs Abs 1.7 0.7 - 4.0 K/uL   Monocytes Relative 7 %   Monocytes Absolute 0.6 0.1 - 1.0 K/uL   Eosinophils Relative 1 %   Eosinophils Absolute 0.1 0.0 - 0.5 K/uL   Basophils Relative 1 %   Basophils Absolute 0.0 0.0 - 0.1 K/uL   Immature Granulocytes 0 %   Abs Immature Granulocytes 0.02 0.00 - 0.07 K/uL  I-Stat Beta hCG blood, ED ([facility], [facility], [facility] only)  Result Value Ref Range   I-stat hCG, quantitative <5.0 <5 mIU/mL   Comment 3           Ct Angio Chest Pe W And/or Wo Contrast  Result Date: 11/30/2018 CLINICAL DATA:  Cough and shortness of breath. EXAM: CT ANGIOGRAPHY CHEST WITH CONTRAST TECHNIQUE: Multidetector CT imaging of the chest was performed using the standard protocol during bolus  administration of intravenous contrast. Multiplanar CT image reconstructions and MIPs were obtained to evaluate the vascular anatomy. CONTRAST:  158mL OMNIPAQUE IOHEXOL 350 MG/ML SOLN COMPARISON:  None. FINDINGS: Cardiovascular: The heart size is normal. No substantial pericardial effusion. No thoracic aortic aneurysm. No filling defect within the opacified pulmonary arteries to suggest the presence of an acute pulmonary embolus. Mediastinum/Nodes: No mediastinal lymphadenopathy. Upper normal lymph nodes are seen in the hilar regions bilaterally. The esophagus has normal imaging features. There is no axillary lymphadenopathy. Lungs/Pleura: Right lung clear. Patchy airspace disease identified in the posterior left upper lobe and left lower lobe compatible with infectious/inflammatory alveolitis. Tiny left pleural effusion noted. Upper Abdomen: Unremarkable. Musculoskeletal: No worrisome lytic or sclerotic osseous abnormality. Review of the MIP images confirms the above findings. IMPRESSION: 1. No CT evidence for acute pulmonary embolus. 2. Patchy airspace opacity in the posterior left upper lobe and left lower lobe consistent with infection/inflammation.  Electronically Signed   By: Misty Stanley M.D.   On: 11/30/2018 19:24   Dg Chest Portable 1 View  Result Date: 11/30/2018 CLINICAL DATA:  Cough with body aches. COVID-19 positive 2 weeks ago. EXAM: PORTABLE CHEST 1 VIEW COMPARISON:  03/26/2013 FINDINGS: Lungs are adequately inflated and otherwise clear. Cardiomediastinal silhouette and remainder of the exam is unchanged. IMPRESSION: No active disease. Electronically Signed   By: Marin Olp M.D.   On: 11/30/2018 13:23      Domenic Moras, PA-C 11/30/18 2023    Little, Wenda Overland, MD 11/30/18 2130

## 2018-12-06 ENCOUNTER — Telehealth (INDEPENDENT_AMBULATORY_CARE_PROVIDER_SITE_OTHER): Payer: Self-pay | Admitting: Family Medicine

## 2018-12-06 ENCOUNTER — Other Ambulatory Visit: Payer: Self-pay

## 2018-12-06 DIAGNOSIS — R1032 Left lower quadrant pain: Secondary | ICD-10-CM

## 2018-12-06 NOTE — Progress Notes (Signed)
Wt- 167lb BP- N/A T- No fever [place]

## 2018-12-06 NOTE — Progress Notes (Signed)
[place] Telemedicine Visit  Patient consented to have virtual visit. Method of visit: Telephone  Encounter participants: Patient: Colleen Daniels - located at home Provider: Bufford Lope - located at [facility] Others (if applicable): none  Chief Complaint: Abdominal pain  HPI:  Patient states that she was seen in the emergency room on 6/11 for pneumonia.  She was given antibiotic Levaquin which she started on 6/12.  She was doing well with it and it seems to be improving her breathing.  On Sunday she started having some cramping abdominal pain.  It has progressively worsened.  It is intermittent.  She feels all over burning in her abdomen with increased pain in her left lower quadrant.  She has some constipation and has not had a bowel movement in 3 days.  She states that it feels like she wants to have a bowel movement but when she tries to go she has some difficulty due to the cramping pain.  She has had a little bit of nausea and NBNB vomiting but is able to keep down food.  She has had no blood in her stool.  She has had no urinary symptoms.  She has had no fevers.  ROS: per HPI  Pertinent PMHx: Irritable bowel syndrome  Exam:  Respiratory: Speaks in full sentences, normal work of breathing  Assessment/Plan: 1. Left lower quadrant abdominal pain Possibly an irritable bowel syndrome flare in the setting of oral antibiotics.  Also on differential is constipation.  Fortunately she has had no fevers or bilious/bloddy emesis or BMs.  Discussed with patient that if severe or worsening that she should be evaluated in the emergency room as she may need abdominal imaging.  Patient opted for supportive care at home with trial of probiotics and MiraLAX.  She voiced good understanding of red flag symptoms.   Time spent during visit with patient: 12 minutes  Bufford Lope, DO PGY-3, [facility] 12/06/2018 9:38 AM

## 2018-12-10 ENCOUNTER — Other Ambulatory Visit: Payer: Self-pay

## 2018-12-10 ENCOUNTER — Emergency Department (HOSPITAL_COMMUNITY)
Admission: EM | Admit: 2018-12-10 | Discharge: 2018-12-10 | Disposition: A | Discharge status: Home / Self Care | Payer: Self-pay | Attending: Emergency Medicine | Admitting: Emergency Medicine

## 2018-12-10 ENCOUNTER — Emergency Department (HOSPITAL_COMMUNITY): Payer: Self-pay

## 2018-12-10 ENCOUNTER — Encounter (HOSPITAL_COMMUNITY): Payer: Self-pay | Admitting: Emergency Medicine

## 2018-12-10 DIAGNOSIS — Z8619 Personal history of other infectious and parasitic diseases: Secondary | ICD-10-CM | POA: Insufficient documentation

## 2018-12-10 DIAGNOSIS — R0789 Other chest pain: Secondary | ICD-10-CM | POA: Insufficient documentation

## 2018-12-10 DIAGNOSIS — Z79899 Other long term (current) drug therapy: Secondary | ICD-10-CM | POA: Insufficient documentation

## 2018-12-10 LAB — CBC
HCT: 39.2 % (ref 36.0–46.0)
Hemoglobin: 13 g/dL (ref 12.0–15.0)
MCH: 28.1 pg (ref 26.0–34.0)
MCHC: 33.2 g/dL (ref 30.0–36.0)
MCV: 84.8 fL (ref 80.0–100.0)
Platelets: 404 10*3/uL — ABNORMAL HIGH (ref 150–400)
RBC: 4.62 MIL/uL (ref 3.87–5.11)
RDW: 12.6 % (ref 11.5–15.5)
WBC: 9.1 10*3/uL (ref 4.0–10.5)
nRBC: 0 % (ref 0.0–0.2)

## 2018-12-10 LAB — HEPATIC FUNCTION PANEL
ALT: 19 U/L (ref 0–44)
AST: 21 U/L (ref 15–41)
Albumin: 3.5 g/dL (ref 3.5–5.0)
Alkaline Phosphatase: 66 U/L (ref 38–126)
Bilirubin, Direct: 0.2 mg/dL (ref 0.0–0.2)
Indirect Bilirubin: 1 mg/dL — ABNORMAL HIGH (ref 0.3–0.9)
Total Bilirubin: 1.2 mg/dL (ref 0.3–1.2)
Total Protein: 7.1 g/dL (ref 6.5–8.1)

## 2018-12-10 LAB — BASIC METABOLIC PANEL
Anion gap: 12 (ref 5–15)
BUN: 9 mg/dL (ref 6–20)
CO2: 20 mmol/L — ABNORMAL LOW (ref 22–32)
Calcium: 9.2 mg/dL (ref 8.9–10.3)
Chloride: 106 mmol/L (ref 98–111)
Creatinine, Ser: 0.7 mg/dL (ref 0.44–1.00)
GFR calc Af Amer: >60 mL/min (ref >60)
GFR calc non Af Amer: >60 mL/min (ref >60)
Glucose, Bld: 101 mg/dL — ABNORMAL HIGH (ref 70–99)
Potassium: 3.1 mmol/L — ABNORMAL LOW (ref 3.5–5.1)
Sodium: 138 mmol/L (ref 135–145)

## 2018-12-10 LAB — I-STAT BETA HCG BLOOD, ED (MC, WL, AP ONLY): I-stat hCG, quantitative: <5 m[IU]/mL (ref <5)

## 2018-12-10 LAB — TROPONIN I: Troponin I: <0.03 ng/mL (ref <0.03)

## 2018-12-10 LAB — LIPASE, BLOOD: Lipase: 32 U/L (ref 11–51)

## 2018-12-10 MED ORDER — LIDOCAINE VISCOUS HCL 2 % MT SOLN
15.0000 mL | Freq: Once | OROMUCOSAL | Status: AC
  Administered 2018-12-10: 15 mL via ORAL
  Filled 2018-12-10: qty 15

## 2018-12-10 MED ORDER — KETOROLAC TROMETHAMINE 60 MG/2ML IM SOLN: 60.0000 mg | Freq: Once | INTRAMUSCULAR | Status: DC

## 2018-12-10 MED ORDER — ALUM & MAG HYDROXIDE-SIMETH 200-200-20 MG/5ML PO SUSP
30.0000 mL | Freq: Once | ORAL | Status: AC
  Administered 2018-12-10: 30 mL via ORAL
  Filled 2018-12-10: qty 30

## 2018-12-10 MED ORDER — KETOROLAC TROMETHAMINE 15 MG/ML IJ SOLN
15.0000 mg | Freq: Once | INTRAMUSCULAR | Status: AC
  Administered 2018-12-10: 15 mg via INTRAVENOUS
  Filled 2018-12-10: qty 1

## 2018-12-10 MED ORDER — SODIUM CHLORIDE 0.9% FLUSH
3.0000 mL | Freq: Once | INTRAVENOUS | Status: AC
  Administered 2018-12-10: 3 mL via INTRAVENOUS

## 2018-12-10 MED ORDER — OMEPRAZOLE 20 MG PO CPDR: 20.0000 mg | DELAYED_RELEASE_CAPSULE | Freq: Every day | ORAL | 0 refills | Status: DC

## 2018-12-10 NOTE — ED Provider Notes (Signed)
[facility] Note   CSN: [id number] Arrival date & time: 12/10/18  1723     History   Chief Complaint Chief Complaint  Patient presents with  . Chest Pain    HPI Colleen Daniels is a 28 y.o. female.     The history is provided by the patient and medical records.  Chest Pain Pain location:  Substernal area Pain quality: aching, burning and dull   Pain quality: not crushing, no pressure, not tearing, not throbbing and no tightness   Pain radiates to:  Does not radiate Pain severity:  Moderate Onset quality:  Gradual Duration:  1 day Timing:  Intermittent Progression:  Unchanged Chronicity:  New Context: movement, at rest and stress   Context: not breathing and not eating   Relieved by:  Nothing Worsened by:  Movement (palpation) Ineffective treatments:  Certain positions, leaning forward and rest Associated symptoms: abdominal pain   Associated symptoms: no anxiety, no back pain, no cough, no diaphoresis, no fever, no lower extremity edema, no orthopnea, no palpitations, no PND, no shortness of breath and no vomiting   Risk factors: birth control   Risk factors: no coronary artery disease, no diabetes mellitus, no high cholesterol, no hypertension, not female and no prior DVT/PE     Past Medical History:  Diagnosis Date  . 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 No pertinent past medical history     Patient Active Problem List   Diagnosis Date Noted  . SOB (shortness of breath) 11/30/2018  . Cough 11/20/2018  . COVID-19 11/20/2018  . BPPV (benign paroxysmal positional vertigo) 09/22/2018  . Acute upper respiratory infection 04/16/2016  . Left sided abdominal pain 01/21/2016    Past Surgical History:  Procedure Laterality Date  . NO PAST SURGERIES       OB History    Gravida  3   Para  3   Term  3   Preterm  0   AB  0   Living  3     SAB  0   TAB  0   Ectopic  0   Multiple  0   Live Births   3            Home Medications    Prior to Admission medications   Medication Sig Start Date End Date Taking? Authorizing Provider  benzonatate (TESSALON) 100 MG capsule Take 1 capsule (100 mg total) by mouth every 8 (eight) hours. 11/30/18   Fayrene Helperran, Bowie, PA-C  levofloxacin (LEVAQUIN) 750 MG tablet Take 1 tablet (750 mg total) by mouth daily. X 7 days 11/30/18   Fayrene Helperran, Bowie, PA-C  Multiple Vitamin (MULTIVITAMIN PO) Take 1 tablet by mouth daily.    [provider]  SPRINTEC 28 0.25-35 MG-MCG tablet Take 1 tablet by mouth daily. 10/10/18   [provider]  pantoprazole (PROTONIX) 40 MG tablet Take 1 tablet (40 mg total) by mouth daily. Patient not taking: Reported on 11/30/2018 01/27/16 11/30/18  Berton BonMikell, Asiyah Zahra, MD    Family History Family History  Problem Relation Age of Onset  . Diabetes Maternal Grandmother   . Hypertension Maternal Grandmother     Social History Social History   Tobacco Use  . Smoking status: Never Smoker  . Smokeless tobacco: Never Used  Substance Use Topics  . Alcohol use: No  . Drug use: No     Allergies   Patient has no known allergies.   Review  of Systems Review of Systems  Constitutional: Negative for diaphoresis and fever.  Respiratory: Negative for cough and shortness of breath.   Cardiovascular: Positive for chest pain. Negative for palpitations, orthopnea and PND.  Gastrointestinal: Positive for abdominal pain. Negative for vomiting.  Musculoskeletal: Negative for back pain.  All other systems reviewed and are negative.    Physical Exam Updated Vital Signs BP 113/78 (BP Location: Left Arm)   Pulse 76   Temp 97.7 F (36.5 C) (Oral)   Resp 20   SpO2 100%   Physical Exam Vitals signs and nursing note reviewed.  Constitutional:      Appearance: She is well-developed. She is not toxic-appearing.  HENT:     Head: Normocephalic and atraumatic.  Eyes:     Conjunctiva/sclera: Conjunctivae normal.  Neck:      Musculoskeletal: Neck supple.     Vascular: No JVD.  Cardiovascular:     Rate and Rhythm: Normal rate and regular rhythm.     Pulses:          Radial pulses are 2+ on the right side and 2+ on the left side.     Heart sounds: No murmur.  Pulmonary:     Effort: Pulmonary effort is normal. No respiratory distress.     Breath sounds: No decreased breath sounds, wheezing or rhonchi.  Chest:     Chest wall: Tenderness present.  Abdominal:     Palpations: Abdomen is soft.     Tenderness: There is no abdominal tenderness. There is no guarding or rebound.  Musculoskeletal:     Right lower leg: She exhibits no tenderness.     Left lower leg: She exhibits no tenderness.  Skin:    General: Skin is warm and dry.     Capillary Refill: Capillary refill takes less than 2 seconds.  Neurological:     General: No focal deficit present.     Mental Status: She is alert and oriented to person, place, and time.      ED Treatments / Results  Labs (all labs ordered are listed, but only abnormal results are displayed) Labs Reviewed  BASIC METABOLIC PANEL  CBC  TROPONIN I  I-STAT BETA HCG BLOOD, ED ([facility], [facility], [facility] ONLY)    EKG    Radiology No results found.  Procedures Procedures (including critical care time)  Medications Ordered in ED Medications  sodium chloride flush (NS) 0.9 % injection 3 mL (has no administration in time range)     Initial Impression / Assessment and Plan / ED Course  I have reviewed the triage vital signs and the nursing notes.  Pertinent labs & imaging results that were available during my care of the patient were reviewed by me and considered in my medical decision making (see chart for details).        Medical Decision Making: Colleen Daniels is a 28 y.o. female who presented to the ED today with chest pain.  Reviewed and confirmed nursing documentation for past medical history, family history, social history.  On my initial exam, the pt was calm,  cooperative, conversant, follows commands appropriately, GCS 15, not tachycardic, not hypotensive, afebrile, no increased work of breathing or respiratory distress, no signs of impending respiratory failure.   The patient's risk factors for ACS were reviewed as well as the EKG. The CXR assists in r/o Pneumonia, Pneumothorax, Esophageal Tears.  No focal lung findings suggestive of pneumonia or pneumothorax.  Pulmonary embolism considered, patient has negative CT angiography for PE  on 6/11, has been off of her birth control for 1 week, and there is no apparent asymmetric upper extremity or lower extremity edema/swelling suggestive of DVT.  Patient denies any fevers, change in chest pain with leaning forward or lying down making pericarditis, myocarditis less likely. There does not appear to be an Aortic Dissection either based on physical exam, historically pain not abrupt in onset, tearing or ripping, pulses symmetric. MSK strain vs Costochondritis are also of consideration given +Rooster crowing sign, pain reproducible with palpation.  Gastritis also of consideration given vague abdominal pain, pain improved after GI cocktail and anti-inflammatory medications.  EKG (my interpretation):      NS rhythm with a rate of  77.      QRS 94. QTc 433      No acute ischemic ST-T segment changes.      No acute changes suggestive of hyperkalemia.      No WPW, LQTS, or Brugada's Syndrome. When compared to previous ECGs from 12/04/18 change no new concerning changes found.  Hear score of 0.  Troponin negative, chest x-ray showed no emergent abnormalities  All radiology and laboratory studies reviewed independently and with my attending physician, agree with reading provided by radiologist unless otherwise noted.  Upon reassessing patient, patient was calm, no new complaints Based on the above findings, I believe patient is hemodynamically stable for discharge  Patient educated about specific return precautions for  given chief complaint and symptoms.  Patient educated about follow-up with PCP.  Patient expressed understanding of return precautions and need for follow-up.  Patient discharged.  The above care was discussed with and agreed upon by my attending physician. Emergency Department Medication Summary:  Medications  sodium chloride flush (NS) 0.9 % injection 3 mL (3 mLs Intravenous Given 12/10/18 1854)  alum & mag hydroxide-simeth (MAALOX/MYLANTA) 200-200-20 MG/5ML suspension 30 mL (30 mLs Oral Given 12/10/18 1854)    And  lidocaine (XYLOCAINE) 2 % viscous mouth solution 15 mL (15 mLs Oral Given 12/10/18 1854)  ketorolac (TORADOL) 15 MG/ML injection 15 mg (15 mg Intravenous Given 12/10/18 1854)        Final Clinical Impressions(s) / ED Diagnoses   Final diagnoses:  None    ED Discharge Orders    None       Erick Alleyasey, Bravery Ketcham, MD 12/10/18 2215    Alvira MondaySchlossman, Erin, MD 12/14/18 1540

## 2018-12-10 NOTE — Discharge Instructions (Signed)

## 2018-12-10 NOTE — ED Notes (Signed)
Lab to add on hepatic function and lipase 

## 2018-12-10 NOTE — ED Notes (Signed)
Patient Alert and oriented to baseline. Stable and ambulatory to baseline. Patient verbalized understanding of the discharge instructions.  Patient belongings were taken by the patient.   

## 2018-12-10 NOTE — ED Notes (Signed)
Pt able to ambulate to restroom independently with minimal discomfort. Pt denies nausea at this time.

## 2018-12-10 NOTE — ED Triage Notes (Signed)
Pt reports CP x 1 day, on the L side of chest radiating to back. Pt reports she was diagnosed with PNA 1 week ago. Pt started on antibiotic but PCP told her not to finish them after having diarrhea and stomach cramping. Pt denies cough, fever, or sore throat. Pt received 2 nitro en route and 324 ASA en route that relieved her pain.

## 2019-04-24 NOTE — Telephone Encounter (Signed)
error 

## 2019-04-26 ENCOUNTER — Other Ambulatory Visit: Payer: Self-pay

## 2019-04-26 ENCOUNTER — Inpatient Hospital Stay (HOSPITAL_COMMUNITY)
Admission: AD | Admit: 2019-04-26 | Discharge: 2019-04-26 | Disposition: A | Discharge status: Home / Self Care | Payer: Self-pay | Attending: Obstetrics & Gynecology | Admitting: Obstetrics & Gynecology

## 2019-04-26 DIAGNOSIS — N939 Abnormal uterine and vaginal bleeding, unspecified: Secondary | ICD-10-CM | POA: Insufficient documentation

## 2019-04-26 DIAGNOSIS — R1030 Lower abdominal pain, unspecified: Secondary | ICD-10-CM | POA: Insufficient documentation

## 2019-04-26 LAB — HCG, QUANTITATIVE, PREGNANCY: hCG, Beta Chain, Quant, S: <1 m[IU]/mL (ref <5)

## 2019-04-26 LAB — POCT PREGNANCY, URINE: Preg Test, Ur: NEGATIVE

## 2019-04-26 NOTE — MAU Note (Signed)
.   Colleen Daniels is a 28 y.o. at Unknown here in MAU reporting: she wasn't feeling well and went to her physician and was told that she is pregnant. Pt states that she has had vaginal bleeding since yesterday afternoon with lower abdominal cramping PT states she is currently taking BCP LMP: 03/28/19 Onset of complaint: yesterday Pain score: 8 Vitals:   04/26/19 0955  BP: 127/75  Pulse: 75  Resp: 16  Temp: 97.6 F (36.4 C)  SpO2: 100%     FHT: Lab orders placed from triage: UPT

## 2019-04-26 NOTE — MAU Provider Note (Signed)
First Provider Initiated Contact with Patient 04/26/19 1119     S Ms. Colleen Daniels is a 28 y.o. [phone] female who presents to MAU today with complaint of vaginal bleeding and abdominal cramping, new onset yesterday afternoon. She uses pills for contraception, endorses 100% compliance. She reports LMP of 03/30/2019. She states she was evaluated at her PCP Monday 04/23/2019 and had a positive pregnancy test there.  O BP 127/75   Pulse 75   Temp 97.6 F (36.4 C)   Resp 16   Ht 5\' 3"  (1.6 m)   Wt 78.5 kg   SpO2 100%   BMI 30.65 kg/m    Physical Exam  Nursing note and vitals reviewed. Constitutional: She is oriented to person, place, and time. She appears well-developed and well-nourished.  Cardiovascular: Normal rate.  Respiratory: Effort normal.  Neurological: She is alert and oriented to person, place, and time.  Skin: Skin is warm and dry.  Psychiatric: She has a normal mood and affect. Her behavior is normal. Judgment and thought content normal.    A Negative urine, patient offered serum Quant hCG Quant hCG <1 Not pregnant, low suspicion for recent miscarriage vs false positive urine test Medical screening exam complete  P Discharge from MAU in stable condition Patient given the option of transfer to [facility] for further evaluation or seek care in outpatient facility of choice List of options for follow-up given  Warning signs for worsening condition that would warrant emergency follow-up discussed Patient may return to MAU as needed for pregnancy related complaints  Mallie Snooks, CNM 04/26/2019 11:27 AM

## 2019-04-27 ENCOUNTER — Other Ambulatory Visit (HOSPITAL_COMMUNITY)
Admission: RE | Admit: 2019-04-27 | Discharge: 2019-04-27 | Disposition: A | Discharge status: Home / Self Care | Payer: Self-pay | Source: Ambulatory Visit | Attending: Family Medicine | Admitting: Family Medicine

## 2019-04-27 ENCOUNTER — Encounter: Payer: Self-pay | Admitting: Family Medicine

## 2019-04-27 ENCOUNTER — Ambulatory Visit (INDEPENDENT_AMBULATORY_CARE_PROVIDER_SITE_OTHER): Payer: Self-pay | Admitting: Family Medicine

## 2019-04-27 ENCOUNTER — Other Ambulatory Visit: Payer: Self-pay

## 2019-04-27 VITALS — BP 125/70 | HR 61 | Wt 169.0 lb

## 2019-04-27 DIAGNOSIS — Z124 Encounter for screening for malignant neoplasm of cervix: Secondary | ICD-10-CM

## 2019-04-27 DIAGNOSIS — Z3200 Encounter for pregnancy test, result unknown: Secondary | ICD-10-CM

## 2019-04-27 DIAGNOSIS — N739 Female pelvic inflammatory disease, unspecified: Secondary | ICD-10-CM

## 2019-04-27 DIAGNOSIS — R300 Dysuria: Secondary | ICD-10-CM

## 2019-04-27 DIAGNOSIS — R102 Pelvic and perineal pain: Secondary | ICD-10-CM

## 2019-04-27 DIAGNOSIS — N898 Other specified noninflammatory disorders of vagina: Secondary | ICD-10-CM

## 2019-04-27 DIAGNOSIS — Z113 Encounter for screening for infections with a predominantly sexual mode of transmission: Secondary | ICD-10-CM

## 2019-04-27 LAB — POCT WET PREP (WET MOUNT)
Clue Cells Wet Prep Whiff POC: NEGATIVE
Trichomonas Wet Prep HPF POC: ABSENT

## 2019-04-27 LAB — POCT URINALYSIS DIP (MANUAL ENTRY)
Bilirubin, UA: NEGATIVE
Glucose, UA: NEGATIVE mg/dL
Ketones, POC UA: NEGATIVE mg/dL
Leukocytes, UA: NEGATIVE
Nitrite, UA: NEGATIVE
Protein Ur, POC: NEGATIVE mg/dL
Spec Grav, UA: 1.02 (ref 1.010–1.025)
Urobilinogen, UA: 0.2 E.U./dL
pH, UA: 6.5 (ref 5.0–8.0)

## 2019-04-27 LAB — POCT URINE PREGNANCY: Preg Test, Ur: NEGATIVE

## 2019-04-27 MED ORDER — METRONIDAZOLE 500 MG PO TABS: 500.0000 mg | ORAL_TABLET | Freq: Two times a day (BID) | ORAL | 0 refills | Status: AC

## 2019-04-27 MED ORDER — CEFTRIAXONE SODIUM 250 MG IJ SOLR
250.0000 mg | Freq: Once | INTRAMUSCULAR | Status: AC
  Administered 2019-04-27: 250 mg via INTRAMUSCULAR

## 2019-04-27 MED ORDER — DOXYCYCLINE HYCLATE 100 MG PO TABS: 100.0000 mg | ORAL_TABLET | Freq: Two times a day (BID) | ORAL | 0 refills | Status: AC

## 2019-04-27 NOTE — Progress Notes (Signed)
Subjective:   Patient ID: Colleen Daniels    DOB: 1991-04-19, 28 y.o. female   MRN: [id number]  Colleen Daniels is a 28 y.o. female with no significant past medical history here for follow up of vaginal bleeding.   Pelvic Pain and Vaginal Bleeding: Patient is a G3P3 coming in today for vaginal bleeding and pelvic pain. She notes she went to urgent care on 11/2 due to having some breast tenderness and nausea. She had urine pregnancy test and was told it was positive. LMP was 03/30/19. She is currently on OCP's which she endorses strict compliance without any missed dosages. She notes with her birth control pills her periods are regular occuring every month. Periods last ~4-5 day. Period is heavy initially then tapers off. When she was told she was pregnant she stopped taking her birth control pills. She notes she developed bleeding and cramping on 11/4. This concerned her given new pregnancy thus was seen in MAU on 11/5. They performed urine pregnancy test which was negative and HCG quant that was <1. She notes that this period seems a little different as she is having cramps/sharp pain on the left side. She is also feeling more pressure which she has never experienced before. Normally shell have midline cramping. She is sexually active with 1 female partner. Does not use condoms. Last sexual intercourse was 11/1. She notes more pain with sexual intercourse at that time.  Endorses some increase in discharge. Endorses vaginal itching. Endorses some pain with urination. Denies any fevers or chills. She does note that her cramps are worse with bowel movement. She is a nonsmoker and non drinker.  Review of Systems:  Per HPI.   PMFSH, medications and smoking status reviewed.  Objective:   BP 125/70   Pulse 61   Wt 169 lb (76.7 kg)   LMP 03/30/2019 (Exact Date)   SpO2 98%   BMI 29.94 kg/m  Vitals and nursing note reviewed.  General: pleasant young female, well nourished, well  developed, in no acute distress with non-toxic appearance, sitting comfortably in exam chair Resp: Breathing comfortably on room air, speaking in full sentences Abdomen: lower abdomen diffusely tender to palpation Skin: warm, dry, no rashes or lesions Extremities: warm and well perfused MSK:  gait normal Neuro: Alert and oriented, speech normal Pelvic exam: VULVA: normal appearing vulva with no masses, tenderness or lesions, VAGINA: normal appearing vagina with normal color and bloody discharge, no lesions, CERVIX: cervical discharge present - bloody, cervical motion tenderness present, Nabothian cyst at 8 o'clock and 5 o'clock, UTERUS: tenderness to palpation, ADNEXA: tenderness both sides  PAP: Pap smear done today WET MOUNT done - results: negative for pathogens, normal epithelial cells Exam chaperoned by Colleen Daniels.  Assessment & Plan:   Pelvic pain Patient is a 28 yo G3P3 presenting with 2 days of pelvic pain and vaginal bleeding. She had a false positive pregnancy test on 11/2 resulting in discontinuation of birth control and subsequent initiation of her menstrual cycle. Expect some symptoms are related to menstrual period. However, pelvic exam positive for cervical motion tenderness, uterine and adnexal tenderness to palpation concerning for PID. Will opt to treat empirically with Ceftriaxone 250mg  IM x 1, Doxycycline BID x 14 days, and Flagyl 500mg  BID x 14 days. Repeat Urine pregnancy test negative and hCG <1 thus not ectopic pregnancy. Differential also includes UTI vs endometriosis given symptoms worse with intercourse and defecation vs ovarian cyst vs less likely ovarian torsion given endorsement of  new left sided pelvic pain.  - GC/Cl, Trich, Wet prep, HIV, RPR, UA pending - Patient instructed to take scheduled NSAIDs during period to help with bleeding and pain, then PRN - Patient to restart her OCPs - Patient instructed to report to ED for further evaluation if pain  worsens, develops fever, if she has no improvement with antibiotics within 24 hours, or if her left sided pelvic pain worsens.  - Patient to RTC if pain doesn't improve with treatment. Consider ultrasound vs referral to gyn for further evaluation given report of pain with intercourse and defecation  Dysuria Endorsed dysuria. UA obtained. Will call with results.  Health Maintenance: Pap-smear performed today. Will call with results.  Orders Placed This Encounter  Procedures  . Urine Culture  . Beta hCG quant (ref lab)  . HIV antibody (with reflex)  . RPR  . POCT urine pregnancy  . POCT urinalysis dipstick  . POCT Wet Prep [facility])   Meds ordered this encounter  Medications  . cefTRIAXone (ROCEPHIN) injection 250 mg  . doxycycline (VIBRA-TABS) 100 MG tablet    Sig: Take 1 tablet (100 mg total) by mouth 2 (two) times daily for 14 days.    Dispense:  28 tablet    Refill:  0  . metroNIDAZOLE (FLAGYL) 500 MG tablet    Sig: Take 1 tablet (500 mg total) by mouth 2 (two) times daily for 14 days.    Dispense:  28 tablet    Refill:  0    Mina Marble, DO PGY-2, [place] [facility] 04/28/2019 3:55 PM

## 2019-04-27 NOTE — Patient Instructions (Addendum)
Thank you so much for coming in to see me today.   Today we obtained many labs to evaluate your pain and bleeding. I will call you with the results.   Given your symptoms, I suspect you may have something called Pelvic Inflammatory disease which you can find more information below. We will treat this with antibiotics. It will be very important for you to complete these antibiotics. If your symptoms don't improve, you develop worsening symptoms, or fever please report to the ED for further evaluation.   Please take an NSIAD like ibuprofen, Aleve, Advil to help with bleeding and pain Restart your birth control.  If your pelvic pain doesn't improve with treatment above or if your left sided pain worsens I recommend going to the ED to ensure we dont miss something like ovarian torsion.  If your pelvic pain doesn't improve after your period and treatment, please follow up in the clinic to have further evaluation for other causes they may be causing your pain.   Thank you so much and I hope you feel better soon, Dr. Mauri Reading  Antibiotics: Doxycycline 100mg : Take twice a day for 14 days Metronidazole 500mg : Take twice a day for 14 days Please avoid alcohol while taking these medicines.     Pelvic Inflammatory Disease  Pelvic inflammatory disease (PID) is an infection in some or all of the female reproductive organs. PID can be in the womb (uterus), ovaries, fallopian tubes, or nearby tissues that are inside the lower belly area (pelvis). PID can lead to problems if it is not treated. What are the causes?  Germs (bacteria) that are spread during sex. This is the most common cause.  Germs in the vagina that are not spread during sex.  Germs that travel up from the vagina or cervix to the reproductive organs after: ? The birth of a baby. ? A miscarriage. ? An abortion. ? Pelvic surgery. ? Insertion of an intrauterine device (IUD). ? A sexual assault. What increases the risk?  Being  younger than 29 years old.  Having sex at a young age.  Having a history of STI (sexually transmitted infection) or PID.  Not using barrier birth control, such as condoms.  Having a lot of sex partners.  Having sex with someone who has symptoms of an STI.  Using a douche.  Having an IUD put in place. What are the signs or symptoms?  Pain in the belly area.  Fever.  Chills.  Discharge from the vagina that is not normal.  Bleeding from the womb that is not normal.  Pain soon after the end of a menstrual period.  Pain when you pee (urinate).  Pain with sex.  Feeling sick to your stomach (nauseous) or throwing up (vomiting). How is this treated?  Antibiotic medicines. In very bad cases, these may be given through an IV tube.  Surgery. This is rare.  Efforts to stop the spread of the infection. Sex partners may need to be treated. It may take weeks until you feel all better. Your doctor may test you for infection again after you finish treatment. You should also be checked for HIV (human immunodeficiency virus). Follow these instructions at home:  Take over-the-counter and prescription medicines only as told by your doctor.  If you were prescribed an antibiotic medicine, take it as told by your doctor. Do not stop taking it even if you start to feel better.  Do not have sex until treatment is done or as told by  your doctor.  Tell your sex partner if you have PID. Your partner may need to be treated.  Keep all follow-up visits as told by your doctor. This is important. Contact a doctor if:  You have more fluid or fluid that is not normal coming from your vagina.  Your pain does not improve.  You throw up.  You have a fever.  You cannot take your medicines.  Your partner has an STI.  You have pain when you pee. Get help right away if:  You have more pain in the belly area.  You have chills.  You are not better in 72 hours with treatment. Summary   Pelvic inflammatory disease (PID) is caused by an infection in some or all of the female reproductive organs.  PID is a serious infection.  This infection is most often treated with antibiotics.  Do not have sex until treatment is done or as told by your doctor. This information is not intended to replace advice given to you by your health care provider. Make sure you discuss any questions you have with your health care provider. Document Released: 09/03/2008 Document Revised: 02/23/2018 Document Reviewed: 03/01/2018 Elsevier Patient Education  2020 [employer].

## 2019-04-28 DIAGNOSIS — R300 Dysuria: Secondary | ICD-10-CM | POA: Insufficient documentation

## 2019-04-28 DIAGNOSIS — R102 Pelvic and perineal pain: Secondary | ICD-10-CM | POA: Insufficient documentation

## 2019-04-28 LAB — RPR: RPR Ser Ql: NONREACTIVE

## 2019-04-28 LAB — BETA HCG QUANT (REF LAB): hCG Quant: <1 m[IU]/mL

## 2019-04-28 LAB — HIV ANTIBODY (ROUTINE TESTING W REFLEX): HIV Screen 4th Generation wRfx: NONREACTIVE

## 2019-04-28 NOTE — Assessment & Plan Note (Addendum)
Patient is a 28 yo G3P3 presenting with 2 days of pelvic pain and vaginal bleeding. She had a false positive pregnancy test on 11/2 resulting in discontinuation of birth control and subsequent initiation of her menstrual cycle. Expect some symptoms are related to menstrual period. However, pelvic exam positive for cervical motion tenderness, uterine and adnexal tenderness to palpation concerning for PID. Will opt to treat empirically with Ceftriaxone 250mg  IM x 1, Doxycycline 154mfg BID x 14 days, and Flagyl 500mg  BID x 14 days. Repeat Urine pregnancy test negative and hCG <1 thus not ectopic pregnancy. Differential also includes UTI vs endometriosis given symptoms worse with intercourse and defecation vs ovarian cyst vs less likely ovarian torsion given endorsement of new left sided pelvic pain.  - GC/Cl, Trich, Wet prep, HIV, RPR, UA pending - Patient instructed to take scheduled NSAIDs during period to help with bleeding and pain, then PRN - Patient to restart her OCPs - Patient instructed to report to ED for further evaluation if pain worsens, develops fever, if she has no improvement with antibiotics within 24 hours, or if her left sided pelvic pain worsens.  - Patient to RTC if pain doesn't improve with treatment. Consider ultrasound vs referral to gyn for further evaluation given report of pain with intercourse and defecation

## 2019-04-28 NOTE — Assessment & Plan Note (Signed)
Endorsed dysuria. UA obtained. Will call with results.

## 2019-04-29 LAB — URINE CULTURE

## 2019-04-30 ENCOUNTER — Telehealth: Payer: Self-pay | Admitting: Family Medicine

## 2019-04-30 NOTE — Telephone Encounter (Signed)
Called patient to check in on how she is doing after empiric treatment for PID and to discuss negative lab results. She notes her menstrual period ended yesterday (11/8) but she is still having constant dull left sided pelvic pain that hasnt improved with the antibiotics thus far. She is on day 3 of her 14 day course of antibiotics (11/7-11/20). She notes she has not been sexually active since before her visit. Given negative lab results, recommended she make a follow up appointment for further evaluation. She agreed and will call to schedule.

## 2019-05-02 LAB — CYTOLOGY - PAP
Chlamydia: NEGATIVE
Comment: NEGATIVE
Comment: NORMAL
Diagnosis: NEGATIVE
Neisseria Gonorrhea: NEGATIVE

## 2019-05-03 ENCOUNTER — Encounter: Payer: Self-pay | Admitting: Family Medicine

## 2019-05-03 NOTE — Progress Notes (Signed)
Attempted to call patient to inform her of her negative Pap-smear. Please call and inform patient her pap-smear with negative. Thank you.

## 2019-05-04 ENCOUNTER — Telehealth: Payer: Self-pay | Admitting: *Deleted

## 2019-05-04 NOTE — Telephone Encounter (Signed)
Pt informed. Jenell Dobransky, CMA  

## 2019-05-04 NOTE — Telephone Encounter (Signed)
-----   Message from Colleen Daniels, [place] sent at 05/03/2019  5:27 PM EST ----- Attempted to call patient to inform her of her negative Pap-smear. Please call and inform patient her pap-smear with negative. Thank you.

## 2019-06-04 ENCOUNTER — Encounter: Payer: Self-pay | Admitting: Family Medicine

## 2019-06-04 ENCOUNTER — Other Ambulatory Visit: Payer: Self-pay

## 2019-06-04 ENCOUNTER — Ambulatory Visit (INDEPENDENT_AMBULATORY_CARE_PROVIDER_SITE_OTHER): Payer: Self-pay | Admitting: Family Medicine

## 2019-06-04 VITALS — BP 102/60 | HR 53 | Wt 166.0 lb

## 2019-06-04 DIAGNOSIS — R102 Pelvic and perineal pain: Secondary | ICD-10-CM

## 2019-06-04 DIAGNOSIS — N941 Unspecified dyspareunia: Secondary | ICD-10-CM

## 2019-06-04 MED ORDER — NAPROXEN 500 MG PO TABS: 500.0000 mg | ORAL_TABLET | Freq: Two times a day (BID) | ORAL | 0 refills | Status: DC

## 2019-06-04 NOTE — Patient Instructions (Signed)
I have placed a referral to gynecology. Please expect a call from them to schedule.  I have call in Naproxen 500mg . You can take this for twice a day for pain.  Please schedule the ultrasound as soon as you can. I will follow up with you once I receive the results.  Take care, Dr. Tarry Kos

## 2019-06-04 NOTE — Progress Notes (Signed)
Subjective:   Patient ID: Colleen Daniels    DOB: 1990/09/13, 28 y.o. female   MRN: [id number]  Colleen Daniels [name] is a 28 y.o. female here for persistent pelvic pain.  Persistent left sided Pelvic Pain: Patient presents today with persistent left sided pelvic pain. She notes she is still having pain with intercourse. She was having persistent spotting even while on her birth control. She notes this has improved. She notes she has pain with urination, particularly if she waits to urinate until her bladder is full. Denies much pain with defecation. Pain is worse when she lifts something heavy or if sitting in particular position. Was treated for PID on 11/6 with Metronidazole and Doxycycline. Completed the entire course of antibiotics. She had only minimal improvement. Denies any nausea or vomiting. She notes experiencing pain with intercourse for years. Shes has some intermittent pelvic pain in her past but it has changed from left to right and vice versa. This left sided pain is constatnt and persistent. She notes the pain is starting to radiate to her buttocks and down her leg. She describes the pain as a dull constant pain. "Feels similar to a contraction but not as severe" Nothing makes it better.  Treated the pain with tylenol 1g q6 hours. Advil only occasionally at 500mg  once - minimal improvement. Compliant with birth control pills.   Has history of IBS-D which she limits many foods for. This pain is different and is not located in the same place. Denies diearrhea, constipation, nausea, vomiting.  Review of Systems:  Per HPI.   [place], medications and smoking status reviewed.  Objective:   BP 102/60   Pulse (!) 53   Wt 166 lb (75.3 kg)   LMP 04/27/2019 (Exact Date)   SpO2 98%   BMI 29.41 kg/m  Vitals and nursing note reviewed.  General: very pleasant young female, sitting comfortably in exam chair, well nourished, well developed, in no acute distress with non-toxic  appearance CV: regular rate and rhythm without murmurs, rubs, or gallops Lungs: clear to auscultation bilaterally with normal work of breathing Abdomen: soft, VERY tender to palpation on left > right pelvic region and lower area of abdomen, non-distended, no masses or organomegaly palpable, normoactive bowel sounds, no suprapubic pain  Skin: warm, dry Extremities: warm and well perfused MSK:  gait normal, No hip pain to palpation. Normal ROM of bilateral hips without reproduction of pain Neuro: Alert and oriented, speech normal  Assessment & Plan:   Pelvic pain Patient is a G3P3 otherwise healthy female presenting today with persistent left>right pelvic pain with dyspareunia and worsening of pain with extension of bladder, certain movements and positions. Some of these symptoms have been present for years, however symptoms appear to be worsening and more persistent. Minimal improvement with high doses of tylenol and NSAIDs. Currently on birth control without improvement.  Prior work up included normal pap-smear, negative pregnancy test, hCG <1, UA witth large blood (likely secondary to menstruation at time of collection), negative wet prep, negative HIV, and negative RPR. Completed course of Metronidazole and Doxycyline for suspected PID at last visit due to significant cervical motion tenderness on exam. She had minimal improvement in symptoms. Differential at this time includes endometriosis given history of symptoms, however it is odd she hasnt had improvement with current birth control. Other differentials include ovarian cyst vs less likely adenomyosis or fibroids given laterality of pain. Also less likely torsion given length of time of symptoms however still possible. Unlikely  ectopic given negative hCG. Although patient lacks GI symptoms, Gi etiology is possible given history of IBS-D and can be further considered upon completion of current work up. MSK etiology is possible given pain in certain  positions and radiation down leg, however exam is not supportive of this at this time. Renal etiology such as renal calculi was considered but lower on differential at this time. - Pelvic and transvaginal ultrasound ordered. Patient lacks insurance. [facility] to schedule as [facility] have worked with her financially in the past. She was advised to make this appointment as soon as possible. - urgent referral to OB/GYN for further evaluation  - Naproxen 500mg  BID PRN for pain - RTC pending above results, sooner if worsening - ED precautions discussed including worsening pain, sudden onset severe pain, development of fevers/chills, etc. She understood and agreed to plan.   Orders Placed This Encounter  Procedures  . Pelvic Complete With Transvaginal    Epic order [id number]/prev u/s pelvis 2013 in epic/no needs/per pt/self pay/clc/pt Pt aware full bladder No to Covid questions    Standing Status:   Future    Standing Expiration Date:   08/04/2020    Order Specific Question:   Reason for Exam (SYMPTOM  OR DIAGNOSIS REQUIRED)    Answer:   Persistent left sided pelvic pain    Order Specific Question:   Preferred imaging location?    Answer:   [id number] 01-03-1972  . Ambulatory referral to Gynecology    Referral Priority:   Urgent    Referral Type:   Consultation    Referral Reason:   Specialty Services Required    Requested Specialty:   Gynecology    Number of Visits Requested:   1   Meds ordered this encounter  Medications  . naproxen (NAPROSYN) 500 MG tablet    Sig: Take 1 tablet (500 mg total) by mouth 2 (two) times daily with a meal.    Dispense:  30 tablet    Refill:  0    Samson Frederic, DO PGY-2, [facility] 06/05/2019 10:13 PM

## 2019-06-05 NOTE — Assessment & Plan Note (Addendum)
Patient is a G3P3 otherwise healthy female presenting today with persistent left>right pelvic pain with dyspareunia and worsening of pain with extension of bladder, certain movements and positions. Some of these symptoms have been present for years, however symptoms appear to be worsening and more persistent. Minimal improvement with high doses of tylenol and NSAIDs. Currently on birth control without improvement.  Prior work up included normal pap-smear, negative pregnancy test, hCG <1, UA witth large blood (likely secondary to menstruation at time of collection), negative wet prep, negative HIV, and negative RPR. Completed course of Metronidazole and Doxycyline for suspected PID at last visit due to significant cervical motion tenderness on exam. She had minimal improvement in symptoms. Differential at this time includes endometriosis given history of symptoms, however it is odd she hasnt had improvement with current birth control. Other differentials include ovarian cyst vs less likely adenomyosis or fibroids given laterality of pain. Also less likely torsion given length of time of symptoms however still possible. Unlikely ectopic given negative hCG. Although patient lacks GI symptoms, Gi etiology is possible given history of IBS-D and can be further considered upon completion of current work up. MSK etiology is possible given pain in certain positions and radiation down leg, however exam is not supportive of this at this time. Renal etiology such as renal calculi was considered but lower on differential at this time. - Pelvic and transvaginal ultrasound ordered. Patient lacks insurance. She plans to [facility] to schedule as they have worked with her financially in the past. She was advised to make this appointment as soon as possible. - urgent referral to OB/GYN for further evaluation  - Naproxen 500mg  BID PRN for pain - RTC pending above results, sooner if worsening - ED precautions discussed  including worsening pain, sudden onset severe pain, development of fevers/chills, etc. She understood and agreed to plan.

## 2019-06-08 ENCOUNTER — Other Ambulatory Visit: Payer: Self-pay

## 2019-06-13 ENCOUNTER — Ambulatory Visit
Admission: RE | Admit: 2019-06-13 | Discharge: 2019-06-13 | Disposition: A | Discharge status: Home / Self Care | Payer: No Typology Code available for payment source | Source: Ambulatory Visit | Attending: Family Medicine | Admitting: Family Medicine

## 2019-06-13 DIAGNOSIS — R102 Pelvic and perineal pain: Secondary | ICD-10-CM

## 2019-06-18 ENCOUNTER — Encounter: Payer: Self-pay | Admitting: Family Medicine

## 2019-07-10 ENCOUNTER — Ambulatory Visit: Payer: Self-pay | Admitting: Obstetrics and Gynecology

## 2019-07-12 ENCOUNTER — Other Ambulatory Visit: Payer: Self-pay

## 2019-07-16 ENCOUNTER — Encounter: Payer: Self-pay | Admitting: Obstetrics and Gynecology

## 2019-07-16 ENCOUNTER — Ambulatory Visit (INDEPENDENT_AMBULATORY_CARE_PROVIDER_SITE_OTHER): Payer: Self-pay | Admitting: Obstetrics and Gynecology

## 2019-07-16 ENCOUNTER — Other Ambulatory Visit: Payer: Self-pay

## 2019-07-16 VITALS — BP 100/66 | HR 64 | Temp 97.3°F | Resp 16 | Ht 62.75 in | Wt 165.0 lb

## 2019-07-16 DIAGNOSIS — Z8709 Personal history of other diseases of the respiratory system: Secondary | ICD-10-CM

## 2019-07-16 NOTE — Progress Notes (Signed)
GYNECOLOGY  VISIT   HPI: 29 y.o.   Married  Hispanic  female   [phone] with Patient's last menstrual period was 06/26/2019 (exact date).   here for pelvic pain x 4-6 months. Patient denies any urinary symptoms or fever.    Patient was seen at urgent care on 07/06/19 for a scratchy throat and left ear symptoms.  She was treated with Amoxicillin for one week. She was not tested for Covid.   She had Covid and pneumonia in May, 2020.  GYNECOLOGIC HISTORY: Patient's last menstrual period was 06/26/2019 (exact date). Contraception:  Junel Menopausal hormone therapy:  n/a Last mammogram:  n/a Last pap smear:  04-27-19 Neg/Neg, 01-30-15 Neg:Neg HR HPV        OB History    Gravida  3   Para  3   Term  3   Preterm  0   AB  0   Living  3     SAB  0   TAB  0   Ectopic  0   Multiple  0   Live Births  3              Patient Active Problem List   Diagnosis Date Noted  . Pelvic pain 04/28/2019  . Dysuria 04/28/2019    Past Medical History:  Diagnosis Date  . COVID-19 10/2018   developed pneumonia  . Dysmenorrhea   . 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 No pertinent past medical history     Past Surgical History:  Procedure Laterality Date  . NO PAST SURGERIES      Current Outpatient Medications  Medication Sig Dispense Refill  . amoxicillin (AMOXIL) 875 MG tablet Take 875 mg by mouth 2 (two) times daily.    Colleen Can FE 1.5/30 1.5-30 MG-MCG tablet Take 1 tablet by mouth daily.    . Multiple Vitamin (MULTIVITAMIN PO) Take 1 tablet by mouth daily.    . naproxen (NAPROSYN) 500 MG tablet Take 1 tablet (500 mg total) by mouth 2 (two) times daily with a meal. 30 tablet 0  . omeprazole (PRILOSEC) 20 MG capsule Take 1 capsule (20 mg total) by mouth daily. 30 capsule 0   No current facility-administered medications for this visit.     ALLERGIES: Patient has no known allergies.  Family History  Problem Relation Age of Onset  . Diabetes Maternal Grandmother   .  Hypertension Maternal Grandmother   . Hyperlipidemia Paternal Grandmother     Social History   Socioeconomic History  . Marital status: Married    Spouse name: Not on file  . Number of children: 3  . Years of education: Not on file  . Highest education level: Not on file  Occupational History  . Occupation: housewife  Tobacco Use  . Smoking status: Former Smoker    Types: Cigarettes    Quit date: 06/21/2008    Years since quitting: 11.0  . Smokeless tobacco: Never Used  Substance and Sexual Activity  . Alcohol use: No  . Drug use: No  . Sexual activity: Yes    Birth control/protection: Pill    Comment: Junel  Other Topics Concern  . Not on file  Social History Narrative  . Not on file   Social Determinants of Health   Financial Resource Strain:   . Difficulty of Paying Living Expenses: Not on file  Food Insecurity:   . Worried About Programme researcher, broadcasting/film/video in the Last Year: Not on file  .  Ran Out of Food in the Last Year: Not on file  Transportation Needs:   . Lack of Transportation (Medical): Not on file  . Lack of Transportation (Non-Medical): Not on file  Physical Activity:   . Days of Exercise per Week: Not on file  . Minutes of Exercise per Session: Not on file  Stress:   . Feeling of Stress : Not on file  Social Connections:   . Frequency of Communication with Friends and Family: Not on file  . Frequency of Social Gatherings with Friends and Family: Not on file  . Attends Religious Services: Not on file  . Active Member of Clubs or Organizations: Not on file  . Attends Archivist Meetings: Not on file  . Marital Status: Not on file  Intimate Partner Violence:   . Fear of Current or Ex-Partner: Not on file  . Emotionally Abused: Not on file  . Physically Abused: Not on file  . Sexually Abused: Not on file    Review of Systems  Genitourinary: Positive for pelvic pain (x4-6 months).  All other systems reviewed and are negative.   PHYSICAL  EXAMINATION:    BP 100/66   Pulse 64   Temp (!) 97.3 F (36.3 C) (Temporal)   Resp 16   Ht 5' 2.75" (1.594 m)   Wt 165 lb (74.8 kg)   LMP 06/26/2019 (Exact Date)   BMI 29.46 kg/m     General appearance: alert, cooperative and appears stated age   ASSESSMENT  Upper respiratory symptoms.  Status post Amoxicillin.  No Covid testing.    PLAN  Patient was briefly seen with proper PPE.  Recommendation for her to schedule Covid testing through [facility] and then call the office back to reschedule with me after receiving negative test results.  I apologized for any inconvenience but emphasized the importance of having a proper evaluation.

## 2019-07-25 ENCOUNTER — Encounter: Payer: Self-pay | Admitting: Obstetrics and Gynecology

## 2019-07-25 ENCOUNTER — Other Ambulatory Visit: Payer: Self-pay

## 2019-07-25 ENCOUNTER — Ambulatory Visit (INDEPENDENT_AMBULATORY_CARE_PROVIDER_SITE_OTHER): Payer: Self-pay | Admitting: Obstetrics and Gynecology

## 2019-07-25 VITALS — BP 110/62 | HR 76 | Temp 97.5°F | Resp 16 | Ht 62.75 in | Wt 165.6 lb

## 2019-07-25 DIAGNOSIS — N941 Unspecified dyspareunia: Secondary | ICD-10-CM

## 2019-07-25 DIAGNOSIS — R102 Pelvic and perineal pain unspecified side: Secondary | ICD-10-CM

## 2019-07-25 DIAGNOSIS — N9489 Other specified conditions associated with female genital organs and menstrual cycle: Secondary | ICD-10-CM

## 2019-07-25 MED ORDER — MEDROXYPROGESTERONE ACETATE 10 MG PO TABS: ORAL_TABLET | ORAL | 2 refills | Status: DC

## 2019-07-25 NOTE — Progress Notes (Signed)
GYNECOLOGY  VISIT   HPI: 29 y.o.   Married  Hispanic  female   [phone] with Patient's last menstrual period was 06/26/2019 (exact date).   here for pelvic pain for 6-7 years but has worsened since 03/2019. Patient denies any urinary symptoms or fever.    Pain started 7 years ago before the birth of her second child.  She had an IUD in the past and had pain with sex.  She removed Mirena as it was embedded in the wall of her uterus.  She had some infection and this was treated.   She delivered her child at 36.5 weeks.  She developed painful intercourse following delivery of this second child.   She had her third child, and her pain continued with sex.   She feels pain all the time and increased if she touches her lower abdomen. The pain is both sharp and dull.  The pain can radiate down her legs and her lower back.  The pain can last 3 hours to all day long.  The pain prevents her from sleeping. She puts a pad in between her legs.  She uses Naproxyn, and this helps sometimes but never really takes the pain away.  Lifting makes the pain worse.  Pain not associated with nausea, vomiting, diarrhea, or fever.   Her menses is not so painful due to taking birth control.  Menses now only 3 days instead of 7 days.   The birth control does not help with pain outside of her menstrual time.  She tried NuvaRing, Nexplanon, Depo Provera, and she did not like the hormonal effects.  She also has suffered from yeast infections.  She had the Mirena which was embedded in her uterine wall.   Negative GC/CT on 04/27/19.  Pelvic US done 06/13/19.  CLINICAL DATA:  Persistent LEFT-sided pelvic pain for 7 years worsening over 4 months, dyspareunia, pain with standing and sitting  EXAM: TRANSABDOMINAL AND TRANSVAGINAL ULTRASOUND OF PELVIS  TECHNIQUE: Both transabdominal and transvaginal ultrasound examinations of the pelvis were performed. Transabdominal technique was performed for global imaging  of the pelvis including uterus, ovaries, adnexal regions, and pelvic cul-de-sac. It was necessary to proceed with endovaginal exam following the transabdominal exam to visualize the ovaries.  COMPARISON:  05/17/2012  FINDINGS: Uterus  Measurements: 8.7 x 4.1 x 5.7 cm = volume: 106 mL. Retroverted. Mildly prominent arcuate vessels. No focal uterine mass.  Endometrium  Thickness: 6 mm.  No endometrial fluid or focal abnormality  Right ovary  Measurements: 3.4 x 1.7 x 3.0 cm = volume: 8.9 mL. Small cyst within RIGHT ovary measuring 2.1 x 1.3 x 1.5 cm and containing a single thin septation versus 2 adjacent follicles. No mural nodularity. Blood flow is seen within the septations/tissue intervening between the 2 cystic locules.  Left ovary  Measurements: 3.6 x 1.7 x 2.4 cm = volume: 7.5 mL. Normal morphology without mass  Other findings  No free pelvic fluid. No other adnexal masses. Numerous prominent vessels in the adnexa bilaterally.  IMPRESSION: Prominent vessels within the adnexa bilaterally as well as prominent arcuate vessels within the uterine wall, question pelvic congestion.  Single minimally complicated cyst with a single septation versus 2 adjacent simple follicles within RIGHT ovary, in aggregate 2.1 cm in greatest dimension.  No other pelvic sonographic abnormalities.   Electronically Signed   By: Ulyses Southward M.D.   On: 06/13/2019 19:46  No CT scan of her abdomen.   She is declining future childbearing.  Patient  was seen on 07/16/19 for a few minutes but had recent pharyngitis and did not have Covid testing.  She was seen on 07/06/19 at urgent care for a scratchy throat and left ear symptoms.  She was treated with Amoxicillin for one week.  I made a recommendation for testing, which she did the same day.  It was negative.   She did have Covid pneumonia in May, 2020.   GYNECOLOGIC HISTORY: Patient's last menstrual period was  06/26/2019 (exact date). Contraception: Junel Menopausal hormone therapy:  n/a Last mammogram:  n/a Last pap smear:04-27-19 Neg/Neg HR HPV, 01-30-15 Neg:Neg HR HPV        OB History    Gravida  3   Para  3   Term  3   Preterm  0   AB  0   Living  3     SAB  0   TAB  0   Ectopic  0   Multiple  0   Live Births  3              Patient Active Problem List   Diagnosis Date Noted  . Pelvic pain 04/28/2019  . Dysuria 04/28/2019    Past Medical History:  Diagnosis Date  . COVID-19 10/2018   developed pneumonia  . Dysmenorrhea   . 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 No pertinent past medical history     Past Surgical History:  Procedure Laterality Date  . NO PAST SURGERIES      Current Outpatient Medications  Medication Sig Dispense Refill  . JUNEL FE 1.5/30 1.5-30 MG-MCG tablet Take 1 tablet by mouth daily.    . Multiple Vitamin (MULTIVITAMIN PO) Take 1 tablet by mouth daily.    . naproxen (NAPROSYN) 500 MG tablet Take 1 tablet (500 mg total) by mouth 2 (two) times daily with a meal. 30 tablet 0  . omeprazole (PRILOSEC) 20 MG capsule Take 1 capsule (20 mg total) by mouth daily. 30 capsule 0   No current facility-administered medications for this visit.     ALLERGIES: Patient has no known allergies.  Family History  Problem Relation Age of Onset  . Diabetes Maternal Grandmother   . Hypertension Maternal Grandmother   . Hyperlipidemia Paternal Grandmother     Social History   Socioeconomic History  . Marital status: Married    Spouse name: Not on file  . Number of children: 3  . Years of education: Not on file  . Highest education level: Not on file  Occupational History  . Occupation: housewife  Tobacco Use  . Smoking status: Former Smoker    Types: Cigarettes    Quit date: 06/21/2008    Years since quitting: 11.0  . Smokeless tobacco: Never Used  Substance and Sexual Activity  . Alcohol use: No  . Drug use: No  . Sexual activity: Yes     Birth control/protection: Pill    Comment: Junel  Other Topics Concern  . Not on file  Social History Narrative  . Not on file   Social Determinants of Health   Financial Resource Strain:   . Difficulty of Paying Living Expenses: Not on file  Food Insecurity:   . Worried About Charity fundraiser in the Last Year: Not on file  . Ran Out of Food in the Last Year: Not on file  Transportation Needs:   . Lack of Transportation (Medical): Not on file  . Lack of Transportation (Non-Medical): Not on  file  Physical Activity:   . Days of Exercise per Week: Not on file  . Minutes of Exercise per Session: Not on file  Stress:   . Feeling of Stress : Not on file  Social Connections:   . Frequency of Communication with Friends and Family: Not on file  . Frequency of Social Gatherings with Friends and Family: Not on file  . Attends Religious Services: Not on file  . Active Member of Clubs or Organizations: Not on file  . Attends Banker Meetings: Not on file  . Marital Status: Not on file  Intimate Partner Violence:   . Fear of Current or Ex-Partner: Not on file  . Emotionally Abused: Not on file  . Physically Abused: Not on file  . Sexually Abused: Not on file    Review of Systems  Genitourinary: Positive for pelvic pain.  All other systems reviewed and are negative.   PHYSICAL EXAMINATION:    BP 110/62 (Cuff Size: Large)   Pulse 76   Temp (!) 97.5 F (36.4 C) (Temporal)   Resp 16   Ht 5' 2.75" (1.594 m)   Wt 165 lb 9.6 oz (75.1 kg)   LMP 06/26/2019 (Exact Date)   BMI 29.57 kg/m     General appearance: alert, cooperative and appears stated age Head: Normocephalic, without obvious abnormality, atraumatic Lungs: clear to auscultation bilaterally Heart: regular rate and rhythm Abdomen: soft, non-tender, no masses,  no organomegaly Extremities: extremities normal, atraumatic, no cyanosis or edema No abnormal inguinal nodes palpated Neurologic: Grossly  normal  Pelvic: External genitalia:  no lesions              Urethra:  normal appearing urethra with no masses, tenderness or lesions              Bartholins and Skenes: normal                 Vagina: normal appearing vagina with normal color and discharge, no lesions              Cervix: no lesions                Bimanual Exam:  Uterus:  normal size, contour, position, consistency, mobility, non-tender              Adnexa: no mass, fullness, tenderness.  Left adnexa adherent to the left uterosacral ligament.               Rectal exam: Yes.  .  Confirms.              Anus:  normal sphincter tone, no lesions  Chaperone was present for exam.  ASSESSMENT  Pelvic pain.  Dyspareunia.  Hx yeast infections with contraception use.  Pelvic congestion on [place].  Left [place] ligament scarring versus ovary adherent to this side.   PLAN  Stop birth control pills.  MPA 30 mg daily for 3 months.  Use condoms and foam.  Return in 3 months or sooner as needed.  Consider CT scan +/- follow up US if pain persists.  Consider future surgical care including laparoscopy with potential hysterectomy if pain continues.     An After Visit Summary was printed and given to the patient.  __45____ minutes face to face time of which over 50% was spent in counseling.

## 2019-10-24 ENCOUNTER — Ambulatory Visit: Payer: Self-pay | Admitting: Obstetrics and Gynecology

## 2019-11-12 ENCOUNTER — Telehealth: Payer: Self-pay

## 2019-11-12 NOTE — Telephone Encounter (Signed)
Patient canceled office visit for recheck for (11/13/19). Patient did not wish to reschedule at this time.

## 2019-11-13 ENCOUNTER — Ambulatory Visit: Payer: Self-pay | Admitting: Obstetrics and Gynecology

## 2019-11-13 NOTE — Telephone Encounter (Signed)
Encounter reviewed and closed.  

## 2020-03-27 ENCOUNTER — Encounter (HOSPITAL_COMMUNITY): Payer: Self-pay | Admitting: Emergency Medicine

## 2020-03-27 ENCOUNTER — Other Ambulatory Visit: Payer: Self-pay

## 2020-03-27 ENCOUNTER — Emergency Department (HOSPITAL_COMMUNITY)
Admission: EM | Admit: 2020-03-27 | Discharge: 2020-03-28 | Disposition: A | Discharge status: Home / Self Care | Payer: Self-pay | Attending: Emergency Medicine | Admitting: Emergency Medicine

## 2020-03-27 DIAGNOSIS — Z8616 Personal history of COVID-19: Secondary | ICD-10-CM | POA: Insufficient documentation

## 2020-03-27 DIAGNOSIS — Z87891 Personal history of nicotine dependence: Secondary | ICD-10-CM | POA: Insufficient documentation

## 2020-03-27 DIAGNOSIS — K649 Unspecified hemorrhoids: Secondary | ICD-10-CM | POA: Insufficient documentation

## 2020-03-27 DIAGNOSIS — K625 Hemorrhage of anus and rectum: Secondary | ICD-10-CM | POA: Insufficient documentation

## 2020-03-27 DIAGNOSIS — N83202 Unspecified ovarian cyst, left side: Secondary | ICD-10-CM | POA: Insufficient documentation

## 2020-03-27 LAB — TYPE AND SCREEN
ABO/RH(D): A POS
Antibody Screen: NEGATIVE

## 2020-03-27 LAB — COMPREHENSIVE METABOLIC PANEL
ALT: 17 U/L (ref 0–44)
AST: 22 U/L (ref 15–41)
Albumin: 3.8 g/dL (ref 3.5–5.0)
Alkaline Phosphatase: 68 U/L (ref 38–126)
Anion gap: 9 (ref 5–15)
BUN: 10 mg/dL (ref 6–20)
CO2: 22 mmol/L (ref 22–32)
Calcium: 9.3 mg/dL (ref 8.9–10.3)
Chloride: 105 mmol/L (ref 98–111)
Creatinine, Ser: 0.64 mg/dL (ref 0.44–1.00)
GFR calc non Af Amer: >60 mL/min (ref >60)
Glucose, Bld: 110 mg/dL — ABNORMAL HIGH (ref 70–99)
Potassium: 3.3 mmol/L — ABNORMAL LOW (ref 3.5–5.1)
Sodium: 136 mmol/L (ref 135–145)
Total Bilirubin: 0.9 mg/dL (ref 0.3–1.2)
Total Protein: 7.1 g/dL (ref 6.5–8.1)

## 2020-03-27 LAB — CBC
HCT: 38.4 % (ref 36.0–46.0)
Hemoglobin: 12.1 g/dL (ref 12.0–15.0)
MCH: 27.8 pg (ref 26.0–34.0)
MCHC: 31.5 g/dL (ref 30.0–36.0)
MCV: 88.1 fL (ref 80.0–100.0)
Platelets: 340 10*3/uL (ref 150–400)
RBC: 4.36 MIL/uL (ref 3.87–5.11)
RDW: 12.9 % (ref 11.5–15.5)
WBC: 11.8 10*3/uL — ABNORMAL HIGH (ref 4.0–10.5)
nRBC: 0 % (ref 0.0–0.2)

## 2020-03-27 LAB — I-STAT BETA HCG BLOOD, ED (MC, WL, AP ONLY): I-stat hCG, quantitative: <5 m[IU]/mL (ref <5)

## 2020-03-27 MED ORDER — FENTANYL CITRATE (PF) 100 MCG/2ML IJ SOLN
50.0000 ug | Freq: Once | INTRAMUSCULAR | Status: AC
  Administered 2020-03-27: 50 ug via INTRAVENOUS
  Filled 2020-03-27: qty 2

## 2020-03-27 NOTE — ED Provider Notes (Signed)
[facility] EMERGENCY DEPARTMENT Provider Note   CSN: [id number] Arrival date & time: 03/27/20  1839     History Chief Complaint  Patient presents with  . Rectal Bleeding    Colleen Daniels is a 29 y.o. female.  Patient with past medical history of IBS presents to the emergency department with a chief complaint of left lower abdominal pain and rectal bleeding.  She states that she has had bright red bleeding for the past week.  She reports gradually worsening left lower quadrant abdominal pain.  She denies any fever, chills, nausea, vomiting.  She has been controlling her IBS with diet.  She has not been seen by GI since 2017.  She does not take any medications for her symptoms.  The history is provided by the patient. No language interpreter was used.       Past Medical History:  Diagnosis Date  . COVID-19 10/2018   developed pneumonia  . Dysmenorrhea   . 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 No pertinent past medical history     Patient Active Problem List   Diagnosis Date Noted  . Pelvic pain 04/28/2019  . Dysuria 04/28/2019    Past Surgical History:  Procedure Laterality Date  . NO PAST SURGERIES       OB History    Gravida  3   Para  3   Term  3   Preterm  0   AB  0   Living  3     SAB  0   TAB  0   Ectopic  0   Multiple  0   Live Births  3           Family History  Problem Relation Age of Onset  . Diabetes Maternal Grandmother   . Hypertension Maternal Grandmother   . Hyperlipidemia Paternal Grandmother     Social History   Tobacco Use  . Smoking status: Former Smoker    Types: Cigarettes    Quit date: 06/21/2008    Years since quitting: 11.7  . Smokeless tobacco: Never Used  Vaping Use  . Vaping Use: Never used  Substance Use Topics  . Alcohol use: No  . Drug use: No    Home Medications Prior to Admission medications   Medication Sig Start Date End Date Taking? Authorizing Provider   medroxyPROGESTERone (PROVERA) 10 MG tablet Take 3 tablets (30 mg) by mouth daily. 07/25/19   Patton Salles, MD  Multiple Vitamin (MULTIVITAMIN PO) Take 1 tablet by mouth daily.    [provider]  naproxen (NAPROSYN) 500 MG tablet Take 1 tablet (500 mg total) by mouth 2 (two) times daily with a meal. 06/04/19   Mullis, Kiersten P, DO  omeprazole (PRILOSEC) 20 MG capsule Take 1 capsule (20 mg total) by mouth daily. 12/10/18   Erick Alley, MD  pantoprazole (PROTONIX) 40 MG tablet Take 1 tablet (40 mg total) by mouth daily. Patient not taking: Reported on 11/30/2018 01/27/16 11/30/18  Berton Bon, MD    Allergies    Patient has no known allergies.  Review of Systems   Review of Systems  All other systems reviewed and are negative.   Physical Exam Updated Vital Signs BP 115/68   Pulse 62   Temp 97.8 F (36.6 C)   Resp 18   Ht 5\' 3"  (1.6 m)   Wt 83.9 kg   SpO2 100%   BMI 32.77 kg/m  Physical Exam Vitals and nursing note reviewed.  Constitutional:      General: She is not in acute distress.    Appearance: She is well-developed.  HENT:     Head: Normocephalic and atraumatic.  Eyes:     Conjunctiva/sclera: Conjunctivae normal.  Cardiovascular:     Rate and Rhythm: Normal rate and regular rhythm.     Heart sounds: No murmur heard.   Pulmonary:     Effort: Pulmonary effort is normal. No respiratory distress.     Breath sounds: Normal breath sounds.  Abdominal:     Palpations: Abdomen is soft.     Tenderness: There is abdominal tenderness.     Comments: LLQ TTP  Genitourinary:    Comments: Chaperone present for rectal exam Palpable internal hemorrhoid No hemorrhage No abscess Musculoskeletal:     Cervical back: Neck supple.  Skin:    General: Skin is warm and dry.  Neurological:     Mental Status: She is alert and oriented to person, place, and time.  Psychiatric:        Mood and Affect: Mood normal.        Behavior: Behavior normal.      ED Results / Procedures / Treatments   Labs (all labs ordered are listed, but only abnormal results are displayed) Labs Reviewed  COMPREHENSIVE METABOLIC PANEL - Abnormal; Notable for the following components:      Result Value   Potassium 3.3 (*)    Glucose, Bld 110 (*)    All other components within normal limits  CBC - Abnormal; Notable for the following components:   WBC 11.8 (*)    All other components within normal limits  I-STAT BETA HCG BLOOD, ED ([facility], [facility], [facility] ONLY)  POC OCCULT BLOOD, ED  TYPE AND SCREEN    EKG None  Radiology No results found.  Procedures Procedures (including critical care time)  Medications Ordered in ED Medications  fentaNYL (SUBLIMAZE) injection 50 mcg (has no administration in time range)    ED Course  I have reviewed the triage vital signs and the nursing notes.  Pertinent labs & imaging results that were available during my care of the patient were reviewed by me and considered in my medical decision making (see chart for details).    MDM Rules/Calculators/A&P                          This patient complains of left lower abdominal pain and rectal bleeding, this involves an extensive number of treatment options, and is a complaint that carries with it a high risk of complications and morbidity.    Differential Dx Diverticulitis, colitis, hemorrhoid, abscess  Pertinent Labs I ordered, reviewed, and interpreted labs, which included CBC, CMP, hCG, notable for mild hypokalemia at 3.3, mild leukocytosis of eleven, negative pregnancy test.  Imaging Interpretation I ordered imaging studies which included CT abdomen/pelvis which showed left-sided 2 cm adnexal cyst, thought to be dominant follicle/simple ovarian cyst, no other acute pathology identified on CT.   Medications I ordered medication fentanyl for pain.  Reassessments After the interventions stated above, I reevaluated the patient and found feeling improved, rectal exam was  performed, and internal hemorrhoid was palpated.  Consultants None  Plan Discharged home with stool softener and NSAIDs for ovarian cyst.  Patient is very well-appearing.  Doubt surgical or acute abdomen.  Return precautions discussed.    Final Clinical Impression(s) / ED Diagnoses Final diagnoses:  Rectal bleeding  Hemorrhoids, unspecified hemorrhoid type  Cyst of left ovary    Rx / DC Orders ED Discharge Orders    None       Roxy Horseman, PA-C 03/28/20 0150    Charlynne Pander, MD 03/28/20 2059

## 2020-03-27 NOTE — ED Triage Notes (Signed)
Patient arrives to ED with complaints of bright red rectal bleeding x1 day. Noticed blood clots today. LLQ & RLQ abdominal, pain x1 week. Hx IBS.

## 2020-03-28 ENCOUNTER — Emergency Department (HOSPITAL_COMMUNITY): Payer: Self-pay

## 2020-03-28 MED ORDER — IOHEXOL 300 MG/ML  SOLN
100.0000 mL | Freq: Once | INTRAMUSCULAR | Status: AC | PRN
  Administered 2020-03-28: 100 mL via INTRAVENOUS

## 2020-03-28 MED ORDER — POLYETHYLENE GLYCOL 3350 17 GM/SCOOP PO POWD: 17.0000 g | Freq: Two times a day (BID) | ORAL | 0 refills | Status: DC

## 2020-03-28 MED ORDER — IBUPROFEN 800 MG PO TABS: 800.0000 mg | ORAL_TABLET | Freq: Three times a day (TID) | ORAL | 0 refills | Status: DC

## 2020-03-28 MED ORDER — HYDROCORTISONE 1 % EX CREA: 1.0000 "application " | TOPICAL_CREAM | Freq: Two times a day (BID) | CUTANEOUS | 2 refills | Status: DC

## 2020-03-28 NOTE — Discharge Instructions (Addendum)
There is a small 2 cm left-sided ovarian cyst which is thought to be causing your pain in your abdomen.  You have a small internal hemorrhoid, which can cause rectal bleeding.  Please use the stool softener along with the Preparation H.  Please stay hydrated.  You can take ibuprofen for the pain from the cyst.  Please follow-up with your primary care doctor.

## 2020-07-28 ENCOUNTER — Ambulatory Visit: Payer: Self-pay | Admitting: Physician Assistant

## 2020-07-30 ENCOUNTER — Encounter: Payer: Self-pay | Admitting: Physician Assistant

## 2020-07-30 ENCOUNTER — Ambulatory Visit (INDEPENDENT_AMBULATORY_CARE_PROVIDER_SITE_OTHER): Payer: BC Managed Care – PPO | Admitting: Physician Assistant

## 2020-07-30 ENCOUNTER — Other Ambulatory Visit: Payer: Self-pay

## 2020-07-30 VITALS — BP 120/70 | HR 73 | Temp 98.7°F | Resp 14 | Ht 63.0 in | Wt 186.0 lb

## 2020-07-30 DIAGNOSIS — R5383 Other fatigue: Secondary | ICD-10-CM

## 2020-07-30 DIAGNOSIS — Z8616 Personal history of COVID-19: Secondary | ICD-10-CM

## 2020-07-30 DIAGNOSIS — K219 Gastro-esophageal reflux disease without esophagitis: Secondary | ICD-10-CM

## 2020-07-30 DIAGNOSIS — Z87898 Personal history of other specified conditions: Secondary | ICD-10-CM

## 2020-07-30 DIAGNOSIS — N946 Dysmenorrhea, unspecified: Secondary | ICD-10-CM

## 2020-07-30 DIAGNOSIS — Z0000 Encounter for general adult medical examination without abnormal findings: Secondary | ICD-10-CM | POA: Diagnosis not present

## 2020-07-30 DIAGNOSIS — K582 Mixed irritable bowel syndrome: Secondary | ICD-10-CM | POA: Diagnosis not present

## 2020-07-30 DIAGNOSIS — N739 Female pelvic inflammatory disease, unspecified: Secondary | ICD-10-CM | POA: Insufficient documentation

## 2020-07-30 DIAGNOSIS — K589 Irritable bowel syndrome without diarrhea: Secondary | ICD-10-CM | POA: Insufficient documentation

## 2020-07-30 DIAGNOSIS — E559 Vitamin D deficiency, unspecified: Secondary | ICD-10-CM | POA: Diagnosis not present

## 2020-07-30 LAB — LIPID PANEL
Cholesterol: 149 mg/dL (ref 0–200)
HDL: 49.4 mg/dL (ref >39.00)
LDL Cholesterol: 87 mg/dL (ref 0–99)
NonHDL: 99.8
Total CHOL/HDL Ratio: 3
Triglycerides: 63 mg/dL (ref 0.0–149.0)
VLDL: 12.6 mg/dL (ref 0.0–40.0)

## 2020-07-30 LAB — VITAMIN B12: Vitamin B-12: 530 pg/mL (ref 211–911)

## 2020-07-30 LAB — CBC WITH DIFFERENTIAL/PLATELET
Basophils Absolute: 0.1 10*3/uL (ref 0.0–0.1)
Basophils Relative: 0.8 % (ref 0.0–3.0)
Eosinophils Absolute: 0.1 10*3/uL (ref 0.0–0.7)
Eosinophils Relative: 1.6 % (ref 0.0–5.0)
HCT: 37.4 % (ref 36.0–46.0)
Hemoglobin: 12.5 g/dL (ref 12.0–15.0)
Lymphocytes Relative: 22 % (ref 12.0–46.0)
Lymphs Abs: 1.4 10*3/uL (ref 0.7–4.0)
MCHC: 33.5 g/dL (ref 30.0–36.0)
MCV: 84.7 fl (ref 78.0–100.0)
Monocytes Absolute: 0.5 10*3/uL (ref 0.1–1.0)
Monocytes Relative: 8.2 % (ref 3.0–12.0)
Neutro Abs: 4.2 10*3/uL (ref 1.4–7.7)
Neutrophils Relative %: 67.4 % (ref 43.0–77.0)
Platelets: 347 10*3/uL (ref 150.0–400.0)
RBC: 4.42 Mil/uL (ref 3.87–5.11)
RDW: 14.3 % (ref 11.5–15.5)
WBC: 6.3 10*3/uL (ref 4.0–10.5)

## 2020-07-30 LAB — COMPREHENSIVE METABOLIC PANEL
ALT: 13 U/L (ref 0–35)
AST: 19 U/L (ref 0–37)
Albumin: 4 g/dL (ref 3.5–5.2)
Alkaline Phosphatase: 66 U/L (ref 39–117)
BUN: 11 mg/dL (ref 6–23)
CO2: 27 mEq/L (ref 19–32)
Calcium: 9.1 mg/dL (ref 8.4–10.5)
Chloride: 104 mEq/L (ref 96–112)
Creatinine, Ser: 0.6 mg/dL (ref 0.40–1.20)
GFR: 120.67 mL/min (ref >60.00)
Glucose, Bld: 82 mg/dL (ref 70–99)
Potassium: 3.8 mEq/L (ref 3.5–5.1)
Sodium: 136 mEq/L (ref 135–145)
Total Bilirubin: 0.8 mg/dL (ref 0.2–1.2)
Total Protein: 7.2 g/dL (ref 6.0–8.3)

## 2020-07-30 LAB — TSH: TSH: 0.85 u[IU]/mL (ref 0.35–4.50)

## 2020-07-30 LAB — VITAMIN D 25 HYDROXY (VIT D DEFICIENCY, FRACTURES): VITD: 15 ng/mL — ABNORMAL LOW (ref 30.00–100.00)

## 2020-07-30 MED ORDER — OMEPRAZOLE 20 MG PO CPDR: 20.0000 mg | DELAYED_RELEASE_CAPSULE | Freq: Every day | ORAL | 1 refills | Status: DC

## 2020-07-30 NOTE — Progress Notes (Signed)
Phone: [phone]   Subjective:  Patient presents today to establish care.  Prior patient of clinic on [address] Dr. Missy Sabins.  Chief Complaint  Patient presents with  . Establish Care  . Annual Exam   30 yo new patient here to establish care. Married, three children. Mood overall good, but she is not sleeping well. States she does nap in the afternoon. 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   IBS symptoms started in 2015. Mixed back and forth between diarrhea and constipation. She had a colonoscopy, which she says confirmed it. She takes probiotics daily.  No surgeries besides colonoscopy. Three normal vaginal deliveries, no complications in pregnancies or deliveries.   She does have painful periods. IUD in 2013 was removed due to an infection and says she has had issues since then. Periods are currently regular. Not on birth control.   Immunizations - last TDAP with last pregnancy in 2016. Declines COVID. Normally takes flu shots, but says she was sick last year and didn't take it then.   The following were reviewed and entered/updated in epic: Past Medical History:  Diagnosis Date  . Allergy    Seasonal  . COVID-19 10/2018   developed pneumonia  . Dysmenorrhea   . 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 IBS (irritable bowel syndrome)   . No pertinent past medical history    Patient Active Problem List   Diagnosis Date Noted  . PID (pelvic inflammatory disease) 07/30/2020  . IBS (irritable bowel syndrome) 07/30/2020  . Pelvic pain 04/28/2019  . Dysuria 04/28/2019   Past Surgical History:  Procedure Laterality Date  . COLONOSCOPY  2016   done in [place] - confirmed IBS mixed     Family History  Problem Relation Age of Onset  . Diabetes Maternal Grandmother   . Hypertension Maternal Grandmother   . Hyperlipidemia Paternal Grandmother     Medications- reviewed and updated Current Outpatient Medications   Medication Sig Dispense Refill  . Ascorbic Acid (VITAMIN C) 1000 MG tablet Take 1,000 mg by mouth daily.    . Multiple Vitamin (MULTIVITAMIN PO) Take 1 tablet by mouth daily.    Marland Kitchen zinc gluconate 50 MG tablet Take 50 mg by mouth daily.     No current facility-administered medications for this visit.    Allergies-reviewed and updated No Known Allergies  Social History   Social History Narrative  . Not on file   No hx of drugs, smoking, or alcohol.  Objective  Objective:  BP 120/70   Pulse 73   Temp 98.7 F (37.1 C) (Temporal)   Resp 14   Ht 5\' 3"  (1.6 m)   Wt 186 lb (84.4 kg)   SpO2 98%   BMI 32.95 kg/m  Gen: NAD, resting comfortably HEENT: Mucous membranes are moist. Oropharynx normal. TM normal. Eyes: sclera and lids normal, PERRLA Neck: no thyromegaly, no cervical lymphadenopathy CV: RRR no murmurs rubs or gallops Lungs: CTAB no crackles, wheeze, rhonchi Abdomen: soft/nontender/nondistended/normal bowel sounds. No rebound or guarding.  Ext: no edema Skin: warm, dry Neuro: 5/5 strength in upper and lower extremities, normal gait, normal reflexes    Assessment and Plan:   1. Encounter for annual health examination 30 year old new patient in today for establishing care and annual physical.  Overall no major concerns from patient today.  She is going to start getting back into exercise and working on her nutrition.  2.  Laboratory tests ordered as part of a complete physical exam (CPE) Labs done in office today and will be resulted in her MyChart.  3. Irritable bowel syndrome with both constipation and diarrhea She has intermittent flares.  She did end up in the emergency department on March 27, 2020 and had a CT of her abdomen and pelvis at that time.  Only abnormality noted was a 2 cm adnexal left-sided cyst.  She had a colonoscopy done in 2015 or 2016 in [place] and there were no abnormalities per patient.  4. PID (pelvic inflammatory disease) She is still  having issues with PID.  She has an appointment next week with her gynecologist and will discuss further with them.  She does note having dyspareunia.  I told her she may want to consider pelvic floor PT.  5. Dysmenorrhea She will discuss with her gynecologist next week.  She is considering going back on birth control.  6. Vitamin D deficiency Checking labs today and will treat if necessary.  7. Fatigue, unspecified type Some fatigue coming from poor sleep quality and lack of exercise.  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  I encouraged her today to start getting back into the gym.  8. History of vertigo She started having bouts of vertigo after her COVID-19 diagnosis.  She will call if any worsening or persistent issues.  9. Personal history of COVID-19 She had Covid in 2020 states she had pneumonia associated with it.  She was not hospitalized.  Does not seem to have any long call or effects from this.  She does not want her COVID-19 vaccines at this time.    Future Appointments  Date Time Provider Department Center  08/08/2020  9:00 AM Patton Salles, MD [facility] None   This visit occurred during the SARS-CoV-2 public health emergency.  Safety protocols were in place, including screening questions prior to the visit, additional usage of staff PPE, and extensive cleaning of exam room while observing appropriate contact time as indicated for disinfecting solutions.    Return precautions advised. Adin Laker M Glorianna Gott, PA-C

## 2020-07-30 NOTE — Patient Instructions (Addendum)
Please go to the lab today and we can call with results or comment to you in MyChart.  Please follow up in 1 year.  Follow up sooner if problems or concerns.      Preventive Care 35-31 Years Old, Female Preventive care refers to lifestyle choices and visits with your health care provider that can promote health and wellness. This includes:  A yearly physical exam. This is also called an annual wellness visit.  Regular dental and eye exams.  Immunizations.  Screening for certain conditions.  Healthy lifestyle choices, such as: ? Eating a healthy diet. ? Getting regular exercise. ? Not using drugs or products that contain nicotine and tobacco. ? Limiting alcohol use. What can I expect for my preventive care visit? Physical exam Your health care provider may check your:  Height and weight. These may be used to calculate your BMI (body mass index). BMI is a measurement that tells if you are at a healthy weight.  Heart rate and blood pressure.  Body temperature.  Skin for abnormal spots. Counseling Your health care provider may ask you questions about your:  Past medical problems.  Family's medical history.  Alcohol, tobacco, and drug use.  Emotional well-being.  Home life and relationship well-being.  Sexual activity.  Diet, exercise, and sleep habits.  Work and work Statistician.  Access to firearms.  Method of birth control.  Menstrual cycle.  Pregnancy history. What immunizations do I need? Vaccines are usually given at various ages, according to a schedule. Your health care provider will recommend vaccines for you based on your age, medical history, and lifestyle or other factors, such as travel or where you work.   What tests do I need? Blood tests  Lipid and cholesterol levels. These may be checked every 5 years starting at age 15.  Hepatitis C test.  Hepatitis B test. Screening  Diabetes screening. This is done by checking your blood sugar  (glucose) after you have not eaten for a while (fasting).  STD (sexually transmitted disease) testing, if you are at risk.  BRCA-related cancer screening. This may be done if you have a family history of breast, ovarian, tubal, or peritoneal cancers.  Pelvic exam and Pap test. This may be done every 3 years starting at age 27. Starting at age 25, this may be done every 5 years if you have a Pap test in combination with an HPV test. Talk with your health care provider about your test results, treatment options, and if necessary, the need for more tests.   Follow these instructions at home: Eating and drinking  Eat a healthy diet that includes fresh fruits and vegetables, whole grains, lean protein, and low-fat dairy products.  Take vitamin and mineral supplements as recommended by your health care provider.  Do not drink alcohol if: ? Your health care provider tells you not to drink. ? You are pregnant, may be pregnant, or are planning to become pregnant.  If you drink alcohol: ? Limit how much you have to 0-1 drink a day. ? Be aware of how much alcohol is in your drink. In the [place] one drink equals one 12 oz bottle of beer (355 mL), one 5 oz glass of wine (148 mL), or one 1 oz glass of hard liquor (44 mL).   Lifestyle  Take daily care of your teeth and gums. Brush your teeth every morning and night with fluoride toothpaste. Floss one time each day.  Stay active. Exercise for at least 30  minutes 5 or more days each week.  Do not use any products that contain nicotine or tobacco, such as cigarettes, e-cigarettes, and chewing tobacco. If you need help quitting, ask your health care provider.  Do not use drugs.  If you are sexually active, practice safe sex. Use a condom or other form of protection to prevent STIs (sexually transmitted infections).  If you do not wish to become pregnant, use a form of birth control. If you plan to become pregnant, see your health care provider for a  prepregnancy visit.  Find healthy ways to cope with stress, such as: ? Meditation, yoga, or listening to music. ? Journaling. ? Talking to a trusted person. ? Spending time with friends and family. Safety  Always wear your seat belt while driving or riding in a vehicle.  Do not drive: ? If you have been drinking alcohol. Do not ride with someone who has been drinking. ? When you are tired or distracted. ? While texting.  Wear a helmet and other protective equipment during sports activities.  If you have firearms in your house, make sure you follow all gun safety procedures.  Seek help if you have been physically or sexually abused. What's next?  Go to your health care provider once a year for an annual wellness visit.  Ask your health care provider how often you should have your eyes and teeth checked.  Stay up to date on all vaccines. This information is not intended to replace advice given to you by your health care provider. Make sure you discuss any questions you have with your health care provider. Document Revised: 02/03/2020 Document Reviewed: 02/16/2018 Elsevier Patient Education  2021 [employer].

## 2020-07-31 ENCOUNTER — Telehealth: Payer: Self-pay

## 2020-07-31 ENCOUNTER — Other Ambulatory Visit: Payer: Self-pay | Admitting: Physician Assistant

## 2020-07-31 MED ORDER — VITAMIN D (ERGOCALCIFEROL) 1.25 MG (50000 UNIT) PO CAPS: 50000.0000 [IU] | ORAL_CAPSULE | ORAL | 0 refills | Status: DC

## 2020-07-31 NOTE — Telephone Encounter (Signed)
Pt called saying she would like the vitamin D prescription sent to [facility] on [address]

## 2020-08-08 ENCOUNTER — Ambulatory Visit: Payer: Self-pay | Admitting: Obstetrics and Gynecology

## 2020-08-15 NOTE — Progress Notes (Signed)
30 y.o. [id number] Married White or Caucasian female here for annual exam, pelvic pain, vaginal itchng & urinary frequency.    Period Duration (Days): 5-7 Period Pattern: Regular Menstrual Flow: Heavy Menstrual Control: Maxi pad Dysmenorrhea: (!) Severe Dysmenorrhea Symptoms: Cramping,Diarrhea,Headache  Patient's last menstrual period was 07/24/2020 (exact date).   Hx pelvic pain since infection/embedded IUD after first child was born. Was seen by Dr. Edward Jolly last year (chronic pelvic pain), Was started on MPA 30 mg daily and took it one month but did not help at all. Is in intense pain 5-7 days before period, gets worse during period (3-4 day) and after period also painful but improved. Takes naproxen and helps a little. In past, took birth control pills, Depo provera, Nexplanon, Nuva Ring and they also did not help. Could not follow up last year because of insurance issues. Did go to ER in Dec 2021 and had CT scan (in chart)  Has also IBS, and pain with bowel movement all the time. Sex is painful every time. Pain is deep inside.        Sexually active: Yes.    The current method of family planning is condoms sometimes.    Exercising: Yes.    moderate amount Smoker:  no  Health Maintenance: Pap:  01-30-15 neg HPV HR neg, 04-27-2019 neg History of abnormal Pap:  no MMG:  none Colonoscopy:  2017 BMD:   none TDaP:  2016 Gardasil:   unsure Covid-19: not done Hep C testing: not done Screening Labs: with PCP   reports that she quit smoking about 12 years ago. Her smoking use included cigarettes. She has never used smokeless tobacco. She reports that she does not drink alcohol and does not use drugs.  Past Medical History:  Diagnosis Date  . Allergy    Seasonal  . COVID-19 10/2018   developed pneumonia  . Dysmenorrhea   . 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 IBS (irritable bowel syndrome)   . No pertinent past medical history     Past Surgical History:  Procedure Laterality Date  .  COLONOSCOPY  2016   done in [place] - confirmed IBS mixed     Current Outpatient Medications  Medication Sig Dispense Refill  . Ascorbic Acid (VITAMIN C) 1000 MG tablet Take 1,000 mg by mouth daily.    Marland Kitchen loratadine (CLARITIN) 10 MG tablet Take 10 mg by mouth daily.    . Multiple Vitamin (MULTIVITAMIN PO) Take 1 tablet by mouth daily.    Marland Kitchen omeprazole (PRILOSEC) 20 MG capsule Take 1 capsule (20 mg total) by mouth daily. 90 capsule 1  . Vitamin D, Ergocalciferol, (DRISDOL) 1.25 MG (50000 UNIT) CAPS capsule Take 1 capsule (50,000 Units total) by mouth every 7 (seven) days. 6 capsule 0  . zinc gluconate 50 MG tablet Take 50 mg by mouth daily.     No current facility-administered medications for this visit.    Family History  Problem Relation Age of Onset  . Diabetes Maternal Grandmother   . Hypertension Maternal Grandmother   . Heart attack Maternal Grandmother   . Osteoporosis Maternal Grandmother   . Hyperlipidemia Paternal Grandmother     Review of Systems  Constitutional: Negative.   HENT: Negative.   Eyes: Negative.   Respiratory: Negative.   Cardiovascular: Negative.   Gastrointestinal: Negative.   Endocrine: Negative.   Genitourinary: Positive for frequency.       Vaginal itching, pelvic pain  Musculoskeletal: Negative.   Skin: Negative.  Allergic/Immunologic: Negative.   Neurological: Negative.   Hematological: Negative.   Psychiatric/Behavioral: Negative.     Exam:   BP 108/64   Pulse 68   Resp 16   Ht 5' 3.25" (1.607 m)   Wt 188 lb (85.3 kg)   LMP 07/24/2020 (Exact Date)   BMI 33.04 kg/m   Height: 5' 3.25" (160.7 cm)  General appearance: alert, cooperative and appears stated age, no acute distress Head: Normocephalic, without obvious abnormality Neck: no adenopathy, thyroid normal to inspection and palpation Lungs: clear to auscultation bilaterally Breasts: No axillary or supraclavicular adenopathy, Normal to palpation without dominant masses Heart:  regular rate and rhythm Abdomen: soft, no masses,  no organomegaly, pt tender with palpation in right and left lower pelvic area. (States this is not new tenderness) + rebound, Neg psoas. Extremities: extremities normal, no edema Skin: No rashes or lesions Lymph nodes: Cervical, supraclavicular, and axillary nodes normal. No abnormal inguinal nodes palpated Neurologic: Grossly normal   Pelvic: External genitalia:  Edema and redness externally near perineum              Urethra:  normal appearing urethra with no masses, tenderness or lesions              Bartholins and Skenes: normal                 Vagina: normal appearing vagina, appropriate for age, normal appearing discharge, no lesions              Cervix: neg cervical motion tenderness, no visible lesions             Bimanual Exam:   Uterus:  tender, prolapsed mild and normal size and shape              Adnexa: no mass, fullness, tenderness                 Joy, CMA Chaperone was present for exam.  A:  Well woman exam  Urinary frequency - Plan: Urinalysis,Complete w/RFL Culture  Vulvar irritation - Plan: WET PREP FOR TRICH, YEAST, CLUE, nystatin-triamcinolone ointment (MYCOLOG)  Chronic pelvic pain in female - Plan: US PELVIS TRANSVAGINAL NON-OB (TV ONLY), ibuprofen (ADVIL) 800 MG tablet  Weight gain   P:   Pap : due 04/2022  F/U with Dr. Edward Jolly. Plan was in place see 07/25/2019 note. CT scan was done in ER 03/2020. Since surgery possible next step, will schedule [place]

## 2020-08-18 ENCOUNTER — Other Ambulatory Visit: Payer: Self-pay

## 2020-08-18 ENCOUNTER — Encounter: Payer: Self-pay | Admitting: Nurse Practitioner

## 2020-08-18 ENCOUNTER — Ambulatory Visit (INDEPENDENT_AMBULATORY_CARE_PROVIDER_SITE_OTHER): Payer: BC Managed Care – PPO | Admitting: Nurse Practitioner

## 2020-08-18 VITALS — BP 108/64 | HR 68 | Resp 16 | Ht 63.25 in | Wt 188.0 lb

## 2020-08-18 DIAGNOSIS — G8929 Other chronic pain: Secondary | ICD-10-CM

## 2020-08-18 DIAGNOSIS — R635 Abnormal weight gain: Secondary | ICD-10-CM

## 2020-08-18 DIAGNOSIS — R102 Pelvic and perineal pain: Secondary | ICD-10-CM

## 2020-08-18 DIAGNOSIS — N9089 Other specified noninflammatory disorders of vulva and perineum: Secondary | ICD-10-CM

## 2020-08-18 DIAGNOSIS — R350 Frequency of micturition: Secondary | ICD-10-CM | POA: Diagnosis not present

## 2020-08-18 DIAGNOSIS — Z01419 Encounter for gynecological examination (general) (routine) without abnormal findings: Secondary | ICD-10-CM

## 2020-08-18 LAB — WET PREP FOR TRICH, YEAST, CLUE

## 2020-08-18 LAB — URINALYSIS, COMPLETE W/RFL CULTURE
Bacteria, UA: NONE SEEN /HPF
Bilirubin Urine: NEGATIVE
Glucose, UA: NEGATIVE
Hgb urine dipstick: NEGATIVE
Hyaline Cast: NONE SEEN /LPF
Ketones, ur: NEGATIVE
Leukocyte Esterase: NEGATIVE
Nitrites, Initial: NEGATIVE
Protein, ur: NEGATIVE
RBC / HPF: NONE SEEN /HPF (ref 0–2)
Specific Gravity, Urine: 1.025 (ref 1.001–1.03)
WBC, UA: NONE SEEN /HPF (ref 0–5)
pH: 5.5 (ref 5.0–8.0)

## 2020-08-18 LAB — NO CULTURE INDICATED

## 2020-08-18 MED ORDER — IBUPROFEN 800 MG PO TABS: 800.0000 mg | ORAL_TABLET | Freq: Three times a day (TID) | ORAL | 1 refills | Status: DC | PRN

## 2020-08-18 MED ORDER — NYSTATIN-TRIAMCINOLONE 100000-0.1 UNIT/GM-% EX OINT: TOPICAL_OINTMENT | CUTANEOUS | 2 refills | Status: DC

## 2020-08-18 NOTE — Patient Instructions (Addendum)
Consider Weight Watchers for weight loss Also there is a [facility] to consider Continue focus on diet and exercise  Health Maintenance, Female Adopting a healthy lifestyle and getting preventive care are important in promoting health and wellness. Ask your health care provider about:  The right schedule for you to have regular tests and exams.  Things you can do on your own to prevent diseases and keep yourself healthy. What should I know about diet, weight, and exercise? Eat a healthy diet  Eat a diet that includes plenty of vegetables, fruits, low-fat dairy products, and lean protein.  Do not eat a lot of foods that are high in solid fats, added sugars, or sodium.   Maintain a healthy weight Body mass index (BMI) is used to identify weight problems. It estimates body fat based on height and weight. Your health care provider can help determine your BMI and help you achieve or maintain a healthy weight. Get regular exercise Get regular exercise. This is one of the most important things you can do for your health. Most adults should:  Exercise for at least 150 minutes each week. The exercise should increase your heart rate and make you sweat (moderate-intensity exercise).  Do strengthening exercises at least twice a week. This is in addition to the moderate-intensity exercise.  Spend less time sitting. Even light physical activity can be beneficial. Watch cholesterol and blood lipids Have your blood tested for lipids and cholesterol at 31 years of age, then have this test every 5 years. Have your cholesterol levels checked more often if:  Your lipid or cholesterol levels are high.  You are older than 31 years of age.  You are at high risk for heart disease. What should I know about cancer screening? Depending on your health history and family history, you may need to have cancer screening at various ages. This may include screening for:  Breast  cancer.  Cervical cancer.  Colorectal cancer.  Skin cancer.  Lung cancer. What should I know about heart disease, diabetes, and high blood pressure? Blood pressure and heart disease  High blood pressure causes heart disease and increases the risk of stroke. This is more likely to develop in people who have high blood pressure readings, are of African descent, or are overweight.  Have your blood pressure checked: ? Every 3-5 years if you are 38-75 years of age. ? Every year if you are 41 years old or older. Diabetes Have regular diabetes screenings. This checks your fasting blood sugar level. Have the screening done:  Once every three years after age 47 if you are at a normal weight and have a low risk for diabetes.  More often and at a younger age if you are overweight or have a high risk for diabetes. What should I know about preventing infection? Hepatitis B If you have a higher risk for hepatitis B, you should be screened for this virus. Talk with your health care provider to find out if you are at risk for hepatitis B infection. Hepatitis C Testing is recommended for:  Everyone born from [birth year] through 1966.  Anyone with known risk factors for hepatitis C. Sexually transmitted infections (STIs)  Get screened for STIs, including gonorrhea and chlamydia, if: ? You are sexually active and are younger than 31 years of age. ? You are older than 31 years of age and your health care provider tells you that you are at risk for this type of infection. ?  Your sexual activity has changed since you were last screened, and you are at increased risk for chlamydia or gonorrhea. Ask your health care provider if you are at risk.  Ask your health care provider about whether you are at high risk for HIV. Your health care provider may recommend a prescription medicine to help prevent HIV infection. If you choose to take medicine to prevent HIV, you should first get tested for HIV. You should  then be tested every 3 months for as long as you are taking the medicine. Pregnancy  If you are about to stop having your period (premenopausal) and you may become pregnant, seek counseling before you get pregnant.  Take 400 to 800 micrograms (mcg) of folic acid every day if you become pregnant.  Ask for birth control (contraception) if you want to prevent pregnancy. Osteoporosis and menopause Osteoporosis is a disease in which the bones lose minerals and strength with aging. This can result in bone fractures. If you are 73 years old or older, or if you are at risk for osteoporosis and fractures, ask your health care provider if you should:  Be screened for bone loss.  Take a calcium or vitamin D supplement to lower your risk of fractures.  Be given hormone replacement therapy (HRT) to treat symptoms of menopause. Follow these instructions at home: Lifestyle  Do not use any products that contain nicotine or tobacco, such as cigarettes, e-cigarettes, and chewing tobacco. If you need help quitting, ask your health care provider.  Do not use street drugs.  Do not share needles.  Ask your health care provider for help if you need support or information about quitting drugs. Alcohol use  Do not drink alcohol if: ? Your health care provider tells you not to drink. ? You are pregnant, may be pregnant, or are planning to become pregnant.  If you drink alcohol: ? Limit how much you use to 0-1 drink a day. ? Limit intake if you are breastfeeding.  Be aware of how much alcohol is in your drink. In the [place] one drink equals one 12 oz bottle of beer (355 mL), one 5 oz glass of wine (148 mL), or one 1 oz glass of hard liquor (44 mL). General instructions  Schedule regular health, dental, and eye exams.  Stay current with your vaccines.  Tell your health care provider if: ? You often feel depressed. ? You have ever been abused or do not feel safe at home. Summary  Adopting a  healthy lifestyle and getting preventive care are important in promoting health and wellness.  Follow your health care provider's instructions about healthy diet, exercising, and getting tested or screened for diseases.  Follow your health care provider's instructions on monitoring your cholesterol and blood pressure. This information is not intended to replace advice given to you by your health care provider. Make sure you discuss any questions you have with your health care provider. Document Revised: 05/31/2018 Document Reviewed: 05/31/2018 Elsevier Patient Education  2021 [employer].

## 2020-08-19 ENCOUNTER — Telehealth: Payer: Self-pay | Admitting: *Deleted

## 2020-08-19 NOTE — Telephone Encounter (Signed)
Pharmacy sent fax stating Prior authorization for nystatin-triamcinolone ointment. Medication will be cheaper for patient if sent separately. I left message to discuss this with patient.

## 2020-08-21 MED ORDER — TRIAMCINOLONE ACETONIDE 0.1 % EX OINT: TOPICAL_OINTMENT | CUTANEOUS | 2 refills | Status: DC

## 2020-08-21 MED ORDER — NYSTATIN 100000 UNIT/GM EX OINT: TOPICAL_OINTMENT | CUTANEOUS | 2 refills | Status: DC

## 2020-08-21 NOTE — Telephone Encounter (Signed)
I discussed the below with patient and she was okay with Rx sent separate. Rx sent.

## 2020-09-01 DIAGNOSIS — B351 Tinea unguium: Secondary | ICD-10-CM | POA: Diagnosis not present

## 2020-09-01 DIAGNOSIS — L602 Onychogryphosis: Secondary | ICD-10-CM | POA: Diagnosis not present

## 2020-09-01 DIAGNOSIS — B353 Tinea pedis: Secondary | ICD-10-CM | POA: Diagnosis not present

## 2020-09-01 DIAGNOSIS — M79674 Pain in right toe(s): Secondary | ICD-10-CM | POA: Diagnosis not present

## 2020-09-02 ENCOUNTER — Telehealth: Payer: Self-pay

## 2020-09-02 NOTE — Telephone Encounter (Signed)
Spoke with patient regarding benefits for scheduled Ultrasound. Patient acknowledges understanding of information presented. Encounter closed. 

## 2020-09-23 ENCOUNTER — Other Ambulatory Visit: Payer: Self-pay

## 2020-09-23 ENCOUNTER — Other Ambulatory Visit: Payer: BC Managed Care – PPO

## 2020-09-23 ENCOUNTER — Encounter: Payer: Self-pay | Admitting: Obstetrics and Gynecology

## 2020-09-23 ENCOUNTER — Ambulatory Visit (INDEPENDENT_AMBULATORY_CARE_PROVIDER_SITE_OTHER): Payer: BC Managed Care – PPO

## 2020-09-23 ENCOUNTER — Ambulatory Visit (INDEPENDENT_AMBULATORY_CARE_PROVIDER_SITE_OTHER): Payer: BC Managed Care – PPO | Admitting: Obstetrics and Gynecology

## 2020-09-23 VITALS — BP 110/64 | HR 65 | Ht 63.25 in | Wt 188.0 lb

## 2020-09-23 DIAGNOSIS — G8929 Other chronic pain: Secondary | ICD-10-CM

## 2020-09-23 DIAGNOSIS — N9489 Other specified conditions associated with female genital organs and menstrual cycle: Secondary | ICD-10-CM | POA: Diagnosis not present

## 2020-09-23 DIAGNOSIS — R102 Pelvic and perineal pain: Secondary | ICD-10-CM

## 2020-09-23 DIAGNOSIS — N946 Dysmenorrhea, unspecified: Secondary | ICD-10-CM

## 2020-09-23 DIAGNOSIS — N941 Unspecified dyspareunia: Secondary | ICD-10-CM | POA: Diagnosis not present

## 2020-09-23 DIAGNOSIS — Z302 Encounter for sterilization: Secondary | ICD-10-CM

## 2020-09-23 NOTE — Progress Notes (Signed)
GYNECOLOGY  VISIT   HPI: 30 y.o.   Married  Caucasian  female   [phone] with Patient's last menstrual period was 09/14/2020 (exact date).   here for pelvic ultrasound for pelvic pain and dysmenorrhea.   Pain can occur at any time but is worse with menstruation.  States she has bilateral lower abdominal and midline pain.  Pain is more left sided in lower abdomen.  Having dyspareunia.  Taking Ibuprofen 1600 mg if needed.  Uses heat as well.   Hx imbedded Mirena IUD and infection.  Had used multiple hormonal treatments to try to control pain, and they have not been successful. She was treated with MPA 30 mg daily to treat pelvic congestion noted at the time of her pelvic US 07/25/19.    CT scan of abdomen and pelvis on 03/28/20 showed fluid in the endometrial canal and a 2 cm low density lesion in the left adnexa representing a possible follicle versus ovarian cyst.   Hx IBS.  Did a colonoscopy and is due for another.  No other findings noted on prior colonoscopy.  Negative GC/CT on 04/27/19.  Declines future childbearing.  Interested in laparoscopy but not hysterectomy.  Would be interested in tubal ligation.   GYNECOLOGIC HISTORY: Patient's last menstrual period was 09/14/2020 (exact date). Contraception:  Condoms sometimes Menopausal hormone therapy:  n/a Last mammogram: n/a Last pap smear: 04-27-19 Neg, 01-30-15 Neg:Neg HR HPV        OB History    Gravida  3   Para  3   Term  3   Preterm  0   AB  0   Living  3     SAB  0   IAB  0   Ectopic  0   Multiple  0   Live Births  3              Patient Active Problem List   Diagnosis Date Noted  . PID (pelvic inflammatory disease) 07/30/2020  . IBS (irritable bowel syndrome) 07/30/2020  . Pelvic pain 04/28/2019  . Dysuria 04/28/2019    Past Medical History:  Diagnosis Date  . Allergy    Seasonal  . COVID-19 10/2018   developed pneumonia  . Dysmenorrhea   . 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 IBS  (irritable bowel syndrome)   . No pertinent past medical history     Past Surgical History:  Procedure Laterality Date  . COLONOSCOPY  2016   done in [place] - confirmed IBS mixed     Current Outpatient Medications  Medication Sig Dispense Refill  . Ascorbic Acid (VITAMIN C) 1000 MG tablet Take 1,000 mg by mouth daily.    Marland Kitchen ibuprofen (ADVIL) 800 MG tablet Take 1 tablet (800 mg total) by mouth every 8 (eight) hours as needed. 60 tablet 1  . loratadine (CLARITIN) 10 MG tablet Take 10 mg by mouth daily.    . Multiple Vitamin (MULTIVITAMIN PO) Take 1 tablet by mouth daily.    Marland Kitchen nystatin ointment (MYCOSTATIN) Apply small amount twice daily x 2 weeks, then may decrease to twice weekly 30 g 2  . omeprazole (PRILOSEC) 20 MG capsule Take 1 capsule (20 mg total) by mouth daily. 90 capsule 1  . triamcinolone ointment (KENALOG) 0.1 % Apply small amount twice daily x 2 weeks, then may decrease to twice weekly 30 g 2  . Vitamin D, Ergocalciferol, (DRISDOL) 1.25 MG (50000 UNIT) CAPS capsule Take 1 capsule (50,000 Units total) by  mouth every 7 (seven) days. 6 capsule 0  . zinc gluconate 50 MG tablet Take 50 mg by mouth daily.     No current facility-administered medications for this visit.     ALLERGIES: Patient has no known allergies.  Family History  Problem Relation Age of Onset  . Diabetes Maternal Grandmother   . Hypertension Maternal Grandmother   . Heart attack Maternal Grandmother   . Osteoporosis Maternal Grandmother   . Hyperlipidemia Paternal Grandmother     Social History   Socioeconomic History  . Marital status: Married    Spouse name: Not on file  . Number of children: 3  . Years of education: Not on file  . Highest education level: Not on file  Occupational History  . Occupation: housewife  Tobacco Use  . Smoking status: Former Smoker    Types: Cigarettes    Quit date: 06/21/2008    Years since quitting: 12.2  . Smokeless tobacco: Never Used  Vaping Use  . Vaping  Use: Never used  Substance and Sexual Activity  . Alcohol use: No  . Drug use: No  . Sexual activity: Yes    Partners: Male    Birth control/protection: Condom    Comment: condoms occ  Other Topics Concern  . Not on file  Social History Narrative  . Not on file   Social Determinants of Health   Financial Resource Strain: Not on file  Food Insecurity: Not on file  Transportation Needs: Not on file  Physical Activity: Not on file  Stress: Not on file  Social Connections: Not on file  Intimate Partner Violence: Not on file    Review of Systems  All other systems reviewed and are negative.   PHYSICAL EXAMINATION:    BP 110/64   Pulse 65   Ht 5' 3.25" (1.607 m)   Wt 188 lb (85.3 kg)   LMP 09/14/2020 (Exact Date)   SpO2 98%   BMI 33.04 kg/m     General appearance: alert, cooperative and appears stated age  Pelvic US Uterus 8.29 x 5.01 x 5.88 cm, retroverted, no myometrial masses. EMS 9.23 mm.  Right ovary normal.  Left ovary with 2 cm simple cyst.  Prominent vessels in adnexa bilaterally.  Prominent arcuate vessels in uterine wall.  No adnexal masses. No free fluid.   ASSESSMENT  Chronic pelvic pain. Dysmenorrhea.  Dyspareunia.  Pelvic congestion noted again by pelvic ultrasound.  Desire for permanent sterilization.   PLAN  Ultrasound findings and images reviewed with patient.  We discussed pelvic pain and possible etiologies including pelvic congestion, endometriosis, and adhesive disease, the latter two of which may not be identifiable on pelvic ultrasound.  Laparoscopic identified as a way to rule out and treat causes for pain beyond pelvic congestion, which is treated medically or procedurally by interventional radiology. Risks of laparoscopy include but are not limited to bleeding, infection, damage to surrounding organs, reaction to anesthesia, pneumonia, DVT, PE, death, hernia formation, and laparotomy to complete the procedure. She wishes to proceed  including bilateral salpingectomy for permanent sterilization and potential laparotomy. I recommend reducing her dosage of Ibuprofen to 800 mg po q 8 hours prn.   37 min  total time was spent for this patient encounter, including preparation, face-to-face counseling with the patient, coordination of care, and documentation of the encounter.

## 2020-09-28 ENCOUNTER — Telehealth: Payer: Self-pay | Admitting: Obstetrics and Gynecology

## 2020-09-28 DIAGNOSIS — N946 Dysmenorrhea, unspecified: Secondary | ICD-10-CM

## 2020-09-28 DIAGNOSIS — G8929 Other chronic pain: Secondary | ICD-10-CM

## 2020-09-28 DIAGNOSIS — Z302 Encounter for sterilization: Secondary | ICD-10-CM

## 2020-09-28 DIAGNOSIS — N941 Unspecified dyspareunia: Secondary | ICD-10-CM

## 2020-09-28 DIAGNOSIS — R102 Pelvic and perineal pain: Secondary | ICD-10-CM

## 2020-09-28 DIAGNOSIS — N9489 Other specified conditions associated with female genital organs and menstrual cycle: Secondary | ICD-10-CM

## 2020-09-28 NOTE — Telephone Encounter (Signed)
Please precert and schedule surgery for my patient.  She will have a laparoscopy with bilateral salpingectomy, possible lysis of adhesions and treatment of endometriosis.  She has chronic pelvic pain, dysmenorrhea, dyspareunia, pelvic congestion, and request for sterilization.

## 2020-09-28 NOTE — Patient Instructions (Signed)
Diagnostic Laparoscopy Diagnostic laparoscopy is a procedure to diagnose problems in the abdomen. It might be done for a variety of reasons, such as to look for scar tissue, a reason for abdominal pain, an abdominal mass or tumor, or fluid in the abdomen (ascites). This procedure may also be done to remove a tissue sample from the liver to look at under a microscope (biopsy). During the procedure, a thin, flexible tube that has a light and a camera on the end (laparoscope) is inserted through a small incision in the abdomen. The image from the camera is shown on a monitor to help the surgeon see inside the body. Tell a health care provider about:  Any allergies you have.  All medicines you are taking, including vitamins, herbs, eye drops, creams, and over-the-counter medicines.  Any problems you or family members have had with anesthetic medicines.  Any blood disorders you have.  Any surgeries you have had.  Any medical conditions you have.  Whether you are pregnant or may be pregnant. What are the risks? Generally, this is a safe procedure. However, problems may occur, including:  Infection.  Bleeding.  Allergic reactions to medicines or dyes.  Damage to abdominal structures or organs, such as the intestines, liver, stomach, or spleen. What happens before the procedure? Staying hydrated Follow instructions from your health care provider about hydration, which may include:  Up to 2 hours before the procedure - you may continue to drink clear liquids, such as water, clear fruit juice, black coffee, and plain tea.   Eating and drinking restrictions Follow instructions from your health care provider about eating and drinking, which may include:  8 hours before the procedure - stop eating heavy meals or foods, such as meat, fried foods, or fatty foods.  6 hours before the procedure - stop eating light meals or foods, such as toast or cereal.  6 hours before the procedure - stop  drinking milk or drinks that contain milk.  2 hours before the procedure - stop drinking clear liquids. Medicines Ask your health care provider about:  Changing or stopping your regular medicines. This is especially important if you are taking diabetes medicines or blood thinners.  Taking medicines such as aspirin and ibuprofen. These medicines can thin your blood. Do not take these medicines unless your health care provider tells you to take them.  Taking over-the-counter medicines, vitamins, herbs, and supplements. General instructions  Ask your health care provider: ? How your surgery site will be marked. ? What steps will be taken to help prevent infection. These steps may include:  Removing hair at the surgery site.  Washing skin with a germ-killing soap.  Taking antibiotic medicine.  Plan to have a responsible adult take you home from the hospital or clinic.  Plan to have a responsible adult care for you for the time you are told after you leave the hospital or clinic. This is important. What happens during the procedure?  An IV will be inserted into one of your veins.  You will be given one or more of the following: ? A medicine to help you relax (sedative). ? A medicine to numb the area (local anesthetic). ? A medicine to make you fall asleep (general anesthetic).  A breathing tube will be placed down your throat to help you breathe during the procedure.  Your abdomen will be filled with an air-like gas so that your abdomen expands. This will give the surgeon more room to operate and will make   your organs easier to see.  Many small incisions will be made in your abdomen.  A laparoscope and other surgical instruments will be inserted into your abdomen through these incisions.  A biopsy may be done. This will depend on the reason why you are having this procedure.  The laparoscope and other instruments will be removed from your abdomen.  The air-like gas will be  released from your abdomen.  Your incisions will be closed with stitches (sutures), skin glue, or surgical tapes and covered with a bandage (dressing).  Your breathing tube will be removed. The procedure may vary among health care providers and hospitals.   What happens after the procedure?  Your blood pressure, heart rate, breathing rate, and blood oxygen level will be monitored until you leave the hospital or clinic.  If you were given a sedative during the procedure, it can affect you for several hours. Do not drive or operate machinery until your health care provider says that it is safe.  It is up to you to get the results of your procedure. Ask your health care provider, or the department that is doing the procedure, when your results will be ready. Summary  Diagnostic laparoscopy is a procedure to diagnose problems in the abdomen using a thin, flexible tube that has a light and a camera on the end (laparoscope).  Follow instructions from your health care provider about how to prepare for the procedure.  Plan to have a responsible adult care for you for the time you are told after you leave the hospital or clinic. This is important. This information is not intended to replace advice given to you by your health care provider. Make sure you discuss any questions you have with your health care provider. Document Revised: 02/01/2020 Document Reviewed: 02/01/2020 Elsevier Patient Education  2021 [employer].  

## 2020-09-29 NOTE — Telephone Encounter (Signed)
Spoke with patient. Patient is driving, will return call at a later time to review surgery dates.

## 2020-09-30 NOTE — Telephone Encounter (Signed)
Spoke with patient regarding surgery benefits. Patient acknowledges understanding of information presented. Patient is aware that benefits presented are professional benefits only. Patient is aware that once surgery is scheduled, the hospital will call with separate benefits. See account note.  Patient stated that she is in the process of figuring out childcare for potential surgery dates and will return call to proceed with scheduling.  Routing to Carmelina Dane, Charity fundraiser, for surgery scheduling.

## 2020-10-07 NOTE — Telephone Encounter (Signed)
Left message to call Noreene Larsson, RN at [facility], [phone] to further discuss surgery planning.

## 2020-11-11 NOTE — Telephone Encounter (Signed)
Spoke with patient.   Patient declines to proceed with scheduling surgery at this time. States her kids will be out of school. Patient states she will return call at a later date to schedule f/u with Dr. Edward Jolly. Questions answered. Patient thankful for call.   Will hold surgery chart.  Routing to Dr. Marjorie Smolder.   Cc: KimAlexis

## 2020-12-03 ENCOUNTER — Telehealth (INDEPENDENT_AMBULATORY_CARE_PROVIDER_SITE_OTHER): Payer: BC Managed Care – PPO | Admitting: Family Medicine

## 2020-12-03 ENCOUNTER — Encounter: Payer: Self-pay | Admitting: Family Medicine

## 2020-12-03 VITALS — Ht 63.0 in | Wt 188.0 lb

## 2020-12-03 DIAGNOSIS — R519 Headache, unspecified: Secondary | ICD-10-CM | POA: Diagnosis not present

## 2020-12-03 DIAGNOSIS — Z8669 Personal history of other diseases of the nervous system and sense organs: Secondary | ICD-10-CM | POA: Diagnosis not present

## 2020-12-03 NOTE — Progress Notes (Signed)
Virtual Visit via Video Note  Subjective  CC: Chief Complaint  Patient presents with   Headache   Emesis    Started on Sunday.   Same day acute visit; PCP not available. New pt to me. Chart reviewed.    I connected with [name] on 12/03/20 at  4:15 PM EDT by a video enabled telemedicine application and verified that I am speaking with the correct person using two identifiers. Location patient: Home Location provider: [facility] [address] Office Persons participating in the virtual visit: Torey Kirsta Probert, Willow Ora, MD Jolyne Loa CMA  I discussed the limitations of evaluation and management by telemedicine and the availability of in person appointments. The patient expressed understanding and agreed to proceed. HPI: Colleen Daniels is a 30 y.o. female who was contacted today to address the problems listed above in the chief complaint. 30 year old G3, P3 presents due to 3days bitemporal headaches associated with fatigue, lightheadedness and 1 episode of vomiting.  She describes it the pain as throbbing and last for several hours at a time.  She denies photophobia, phonophobia or neurologic changes.  She has a history of migraines but this feels different.  She does report history of having headaches like this but they did not last for 3 days.  She took Naprosyn 500 once.  She says it did not relieve her symptoms.  Currently, she has mild bitemporal headache symptoms.  Her appetite is otherwise okay.  She denies fevers, chills or URI symptoms.  There is no cough.  No hemiparesis.  No neck pain.  She denies vertigo.  She denies syncope.  Assessment  1. Acute intractable headache, unspecified headache type   2. History of migraine      Plan  Headaches, unspecified: Difficult given video exam and limited exam.  Patient appears nontoxic.  Possible tension headaches.  Recommend treatment with different over-the-counter medication:  Excedrin Migraine or Advil or Tylenol.  We discussed red flag symptoms in detail.  None are currently present.  She is to go to the emergency room if they present.  If she cannot get her headaches under control, recommend an office visit for further evaluation.  Patient she has decreased I discussed the assessment and treatment plan with the patient. The patient was provided an opportunity to ask questions and all were answered. The patient agreed with the plan and demonstrated an understanding of the instructions.   The patient was advised to call back or seek an in-person evaluation if the symptoms worsen or if the condition fails to improve as anticipated. Follow up: As needed Visit date not found  No orders of the defined types were placed in this encounter.     I reviewed the patients updated PMH, FH, and SocHx.    Patient Active Problem List   Diagnosis Date Noted   PID (pelvic inflammatory disease) 07/30/2020   IBS (irritable bowel syndrome) 07/30/2020   Pelvic pain 04/28/2019   Dysuria 04/28/2019   Current Meds  Medication Sig   Ascorbic Acid (VITAMIN C) 1000 MG tablet Take 1,000 mg by mouth daily.   ibuprofen (ADVIL) 800 MG tablet Take 1 tablet (800 mg total) by mouth every 8 (eight) hours as needed.   loratadine (CLARITIN) 10 MG tablet Take 10 mg by mouth daily.   Multiple Vitamin (MULTIVITAMIN PO) Take 1 tablet by mouth daily.   nystatin ointment (MYCOSTATIN) Apply small amount twice daily x 2 weeks, then may decrease  to twice weekly   omeprazole (PRILOSEC) 20 MG capsule Take 1 capsule (20 mg total) by mouth daily.   triamcinolone ointment (KENALOG) 0.1 % Apply small amount twice daily x 2 weeks, then may decrease to twice weekly   zinc gluconate 50 MG tablet Take 50 mg by mouth daily.    Allergies: Patient has No Known Allergies. Family History: Patient family history includes Diabetes in her maternal grandmother; Heart attack in her maternal grandmother; Hyperlipidemia  in her paternal grandmother; Hypertension in her maternal grandmother; Osteoporosis in her maternal grandmother. Social History:  Patient  reports that she quit smoking about 12 years ago. Her smoking use included cigarettes. She has never used smokeless tobacco. She reports that she does not drink alcohol and does not use drugs.  Review of Systems: Constitutional: Negative for fever malaise or anorexia Cardiovascular: negative for chest pain Respiratory: negative for SOB or persistent cough Gastrointestinal: negative for abdominal pain  OBJECTIVE Vitals: Ht 5\' 3"  (1.6 m)   Wt 188 lb (85.3 kg)   LMP 11/26/2020 (Exact Date)   BMI 33.30 kg/m  General: no acute distress , A&Ox3 Appears well, normal speech  01/26/2021, MD

## 2020-12-09 ENCOUNTER — Encounter: Payer: Self-pay | Admitting: Family Medicine

## 2021-03-06 ENCOUNTER — Encounter (HOSPITAL_COMMUNITY): Payer: Self-pay | Admitting: Obstetrics and Gynecology

## 2021-03-06 ENCOUNTER — Other Ambulatory Visit: Payer: Self-pay

## 2021-03-06 ENCOUNTER — Inpatient Hospital Stay (HOSPITAL_COMMUNITY): Payer: BC Managed Care – PPO

## 2021-03-06 ENCOUNTER — Inpatient Hospital Stay (HOSPITAL_COMMUNITY)
Admission: AD | Admit: 2021-03-06 | Discharge: 2021-03-06 | Disposition: A | Discharge status: Home / Self Care | Payer: BC Managed Care – PPO | Attending: Obstetrics and Gynecology | Admitting: Obstetrics and Gynecology

## 2021-03-06 DIAGNOSIS — B373 Candidiasis of vulva and vagina: Secondary | ICD-10-CM | POA: Diagnosis not present

## 2021-03-06 DIAGNOSIS — O3680X0 Pregnancy with inconclusive fetal viability, not applicable or unspecified: Secondary | ICD-10-CM | POA: Diagnosis not present

## 2021-03-06 DIAGNOSIS — O98811 Other maternal infectious and parasitic diseases complicating pregnancy, first trimester: Secondary | ICD-10-CM | POA: Diagnosis not present

## 2021-03-06 DIAGNOSIS — Z87891 Personal history of nicotine dependence: Secondary | ICD-10-CM | POA: Insufficient documentation

## 2021-03-06 DIAGNOSIS — Z3A01 Less than 8 weeks gestation of pregnancy: Secondary | ICD-10-CM | POA: Insufficient documentation

## 2021-03-06 DIAGNOSIS — R109 Unspecified abdominal pain: Secondary | ICD-10-CM | POA: Diagnosis not present

## 2021-03-06 DIAGNOSIS — R102 Pelvic and perineal pain: Secondary | ICD-10-CM | POA: Insufficient documentation

## 2021-03-06 DIAGNOSIS — Z79899 Other long term (current) drug therapy: Secondary | ICD-10-CM | POA: Insufficient documentation

## 2021-03-06 DIAGNOSIS — Z3A00 Weeks of gestation of pregnancy not specified: Secondary | ICD-10-CM | POA: Diagnosis not present

## 2021-03-06 DIAGNOSIS — B3731 Acute candidiasis of vulva and vagina: Secondary | ICD-10-CM

## 2021-03-06 DIAGNOSIS — O26891 Other specified pregnancy related conditions, first trimester: Secondary | ICD-10-CM | POA: Insufficient documentation

## 2021-03-06 DIAGNOSIS — Z8616 Personal history of COVID-19: Secondary | ICD-10-CM | POA: Insufficient documentation

## 2021-03-06 DIAGNOSIS — O26899 Other specified pregnancy related conditions, unspecified trimester: Secondary | ICD-10-CM

## 2021-03-06 HISTORY — DX: Other specified conditions associated with female genital organs and menstrual cycle: N94.89

## 2021-03-06 LAB — CBC
HCT: 35.8 % — ABNORMAL LOW (ref 36.0–46.0)
Hemoglobin: 12.1 g/dL (ref 12.0–15.0)
MCH: 28.5 pg (ref 26.0–34.0)
MCHC: 33.8 g/dL (ref 30.0–36.0)
MCV: 84.4 fL (ref 80.0–100.0)
Platelets: 334 10*3/uL (ref 150–400)
RBC: 4.24 MIL/uL (ref 3.87–5.11)
RDW: 13.2 % (ref 11.5–15.5)
WBC: 7.4 10*3/uL (ref 4.0–10.5)
nRBC: 0 % (ref 0.0–0.2)

## 2021-03-06 LAB — URINALYSIS, ROUTINE W REFLEX MICROSCOPIC
Bilirubin Urine: NEGATIVE
Glucose, UA: NEGATIVE mg/dL
Hgb urine dipstick: NEGATIVE
Ketones, ur: NEGATIVE mg/dL
Leukocytes,Ua: NEGATIVE
Nitrite: NEGATIVE
Protein, ur: NEGATIVE mg/dL
Specific Gravity, Urine: 1.03 (ref 1.005–1.030)
pH: 5 (ref 5.0–8.0)

## 2021-03-06 LAB — COMPREHENSIVE METABOLIC PANEL
ALT: 13 U/L (ref 0–44)
AST: 18 U/L (ref 15–41)
Albumin: 3.5 g/dL (ref 3.5–5.0)
Alkaline Phosphatase: 62 U/L (ref 38–126)
Anion gap: 6 (ref 5–15)
BUN: 7 mg/dL (ref 6–20)
CO2: 23 mmol/L (ref 22–32)
Calcium: 8.9 mg/dL (ref 8.9–10.3)
Chloride: 107 mmol/L (ref 98–111)
Creatinine, Ser: 0.66 mg/dL (ref 0.44–1.00)
GFR, Estimated: >60 mL/min (ref >60)
Glucose, Bld: 109 mg/dL — ABNORMAL HIGH (ref 70–99)
Potassium: 3.2 mmol/L — ABNORMAL LOW (ref 3.5–5.1)
Sodium: 136 mmol/L (ref 135–145)
Total Bilirubin: 0.9 mg/dL (ref 0.3–1.2)
Total Protein: 6.7 g/dL (ref 6.5–8.1)

## 2021-03-06 LAB — WET PREP, GENITAL
Clue Cells Wet Prep HPF POC: NONE SEEN
Trich, Wet Prep: NONE SEEN

## 2021-03-06 LAB — POCT PREGNANCY, URINE: Preg Test, Ur: POSITIVE — AB

## 2021-03-06 LAB — HCG, QUANTITATIVE, PREGNANCY: hCG, Beta Chain, Quant, S: 1331 m[IU]/mL — ABNORMAL HIGH (ref <5)

## 2021-03-06 MED ORDER — PREPLUS 27-1 MG PO TABS: 1.0000 | ORAL_TABLET | Freq: Every day | ORAL | 13 refills | Status: AC

## 2021-03-06 MED ORDER — TERCONAZOLE 0.8 % VA CREA: 1.0000 | TOPICAL_CREAM | Freq: Every day | VAGINAL | 0 refills | Status: DC

## 2021-03-06 NOTE — MAU Provider Note (Signed)
History     CSN: [id number]  Arrival date and time: 03/06/21 2694   Event Date/Time   First Provider Initiated Contact with Patient 03/06/21 1039      Chief Complaint  Patient presents with   Abdominal Pain   Possible Pregnancy   HPI Colleen Daniels is a 30 y.o. [id number] at [redacted]w[redacted]d by LMP who presents to MAU with chief complaint of suprapubic pain. This is a new problem, onset Monday 09/12. Patient states she consistently has this level of cramping about two weeks before her period starts so she was not particularly concerned. However, she realized her period was late, obtained a positive HPT and became nervous that she might be having a miscarriage.   Patient's pain score is 8/10. Her pain radiates bilaterally to her thighs. She prefers to not take medicine and so has not taken or tried other treatments for this complaint. She denies vaginal bleeding, dysuria, fever or recent illness. She is remote from sexual intercourse.   OB History     Gravida  4   Para  3   Term  3   Preterm  0   AB  0   Living  3      SAB  0   IAB  0   Ectopic  0   Multiple  0   Live Births  3           Past Medical History:  Diagnosis Date   Allergy    Seasonal   COVID-19 10/2018   developed pneumonia   Dysmenorrhea    Hepatitis B    as a child   IBS (irritable bowel syndrome)    No pertinent past medical history    Pelvic congestion syndrome     Past Surgical History:  Procedure Laterality Date   COLONOSCOPY  2016   done in [place] - confirmed IBS mixed     Family History  Problem Relation Age of Onset   Stroke Maternal Grandmother    Diabetes Maternal Grandmother    Hypertension Maternal Grandmother    Heart attack Maternal Grandmother    Osteoporosis Maternal Grandmother    Hyperlipidemia Paternal Grandmother     Social History   Tobacco Use   Smoking status: Former    Types: Cigarettes    Quit date: 06/21/2008    Years since quitting: 12.7    Smokeless tobacco: Never  Vaping Use   Vaping Use: Never used  Substance Use Topics   Alcohol use: No   Drug use: No    Allergies: No Known Allergies  Medications Prior to Admission  Medication Sig Dispense Refill Last Dose   Ascorbic Acid (VITAMIN C) 1000 MG tablet Take 1,000 mg by mouth daily.      ibuprofen (ADVIL) 800 MG tablet Take 1 tablet (800 mg total) by mouth every 8 (eight) hours as needed. 60 tablet 1    loratadine (CLARITIN) 10 MG tablet Take 10 mg by mouth daily.      Multiple Vitamin (MULTIVITAMIN PO) Take 1 tablet by mouth daily.      nystatin ointment (MYCOSTATIN) Apply small amount twice daily x 2 weeks, then may decrease to twice weekly 30 g 2    omeprazole (PRILOSEC) 20 MG capsule Take 1 capsule (20 mg total) by mouth daily. 90 capsule 1    triamcinolone ointment (KENALOG) 0.1 % Apply small amount twice daily x 2 weeks, then may decrease to twice weekly 30 g 2    Vitamin D,  Ergocalciferol, (DRISDOL) 1.25 MG (50000 UNIT) CAPS capsule Take 1 capsule (50,000 Units total) by mouth every 7 (seven) days. (Patient not taking: Reported on 12/03/2020) 6 capsule 0    zinc gluconate 50 MG tablet Take 50 mg by mouth daily.       Review of Systems  Gastrointestinal:  Positive for abdominal pain.  All other systems reviewed and are negative. Physical Exam   Blood pressure (!) 110/54, pulse 71, temperature 98.1 F (36.7 C), temperature source Oral, resp. rate 16, height 5\' 3"  (1.6 m), weight 87.1 kg, last menstrual period 02/03/2021, SpO2 100 %.  Physical Exam Vitals and nursing note reviewed. Exam conducted with a chaperone present.  Constitutional:      Appearance: She is well-developed.  Cardiovascular:     Rate and Rhythm: Normal rate and regular rhythm.     Heart sounds: Normal heart sounds.  Pulmonary:     Effort: Pulmonary effort is normal.     Breath sounds: Normal breath sounds.  Abdominal:     Palpations: Abdomen is soft.     Tenderness: There is abdominal  tenderness in the suprapubic area. There is no right CVA tenderness or left CVA tenderness.  Skin:    Capillary Refill: Capillary refill takes less than 2 seconds.  Neurological:     Mental Status: She is alert and oriented to person, place, and time.  Psychiatric:        Mood and Affect: Mood normal.        Behavior: Behavior normal.    MAU Course  Procedures  Orders Placed This Encounter  Procedures   Wet prep, genital   02/05/2021 OB LESS THAN 14 WEEKS WITH OB TRANSVAGINAL   Urinalysis, Routine w reflex microscopic Urine, Clean Catch   CBC   Comprehensive metabolic panel   hCG, quantitative, pregnancy   Nursing communication   Pregnancy, urine POC   Discharge patient   Patient Vitals for the past 24 hrs:  BP Temp Temp src Pulse Resp SpO2 Height Weight  03/06/21 1229 110/60 -- -- 67 -- -- -- --  03/06/21 1032 (!) 110/54 -- -- 71 -- -- -- --  03/06/21 1013 116/65 98.1 F (36.7 C) Oral 77 16 100 % 5\' 3"  (1.6 m) 87.1 kg   Results for orders placed or performed during the hospital encounter of 03/06/21 (from the past 24 hour(s))  Pregnancy, urine POC     Status: Abnormal   Collection Time: 03/06/21 10:21 AM  Result Value Ref Range   Preg Test, Ur POSITIVE (A) NEGATIVE  Urinalysis, Routine w reflex microscopic Urine, Clean Catch     Status: Abnormal   Collection Time: 03/06/21 10:30 AM  Result Value Ref Range   Color, Urine AMBER (A) YELLOW   APPearance TURBID (A) CLEAR   Specific Gravity, Urine 1.030 1.005 - 1.030   pH 5.0 5.0 - 8.0   Glucose, UA NEGATIVE NEGATIVE mg/dL   Hgb urine dipstick NEGATIVE NEGATIVE   Bilirubin Urine NEGATIVE NEGATIVE   Ketones, ur NEGATIVE NEGATIVE mg/dL   Protein, ur NEGATIVE NEGATIVE mg/dL   Nitrite NEGATIVE NEGATIVE   Leukocytes,Ua NEGATIVE NEGATIVE   Bacteria, UA FEW (A) NONE SEEN   Squamous Epithelial / LPF 6-10 0 - 5   Mucus PRESENT    Amorphous Crystal PRESENT   CBC     Status: Abnormal   Collection Time: 03/06/21 10:42 AM  Result Value  Ref Range   WBC 7.4 4.0 - 10.5 K/uL   RBC 4.24 3.87 -  5.11 MIL/uL   Hemoglobin 12.1 12.0 - 15.0 g/dL   HCT 44.0 (L) 10.2 - 72.5 %   MCV 84.4 80.0 - 100.0 fL   MCH 28.5 26.0 - 34.0 pg   MCHC 33.8 30.0 - 36.0 g/dL   RDW 36.6 44.0 - 34.7 %   Platelets 334 150 - 400 K/uL   nRBC 0.0 0.0 - 0.2 %  Comprehensive metabolic panel     Status: Abnormal   Collection Time: 03/06/21 10:42 AM  Result Value Ref Range   Sodium 136 135 - 145 mmol/L   Potassium 3.2 (L) 3.5 - 5.1 mmol/L   Chloride 107 98 - 111 mmol/L   CO2 23 22 - 32 mmol/L   Glucose, Bld 109 (H) 70 - 99 mg/dL   BUN 7 6 - 20 mg/dL   Creatinine, Ser 4.25 0.44 - 1.00 mg/dL   Calcium 8.9 8.9 - 95.6 mg/dL   Total Protein 6.7 6.5 - 8.1 g/dL   Albumin 3.5 3.5 - 5.0 g/dL   AST 18 15 - 41 U/L   ALT 13 0 - 44 U/L   Alkaline Phosphatase 62 38 - 126 U/L   Total Bilirubin 0.9 0.3 - 1.2 mg/dL   GFR, Estimated >38 >75 mL/min   Anion gap 6 5 - 15  hCG, quantitative, pregnancy     Status: Abnormal   Collection Time: 03/06/21 10:42 AM  Result Value Ref Range   hCG, Beta Chain, Quant, S 1,331 (H) <5 mIU/mL  Wet prep, genital     Status: Abnormal   Collection Time: 03/06/21 10:49 AM   Specimen: PATH Cytology Cervicovaginal Ancillary Only  Result Value Ref Range   Yeast Wet Prep HPF POC PRESENT (A) NONE SEEN   Trich, Wet Prep NONE SEEN NONE SEEN   Clue Cells Wet Prep HPF POC NONE SEEN NONE SEEN   WBC, Wet Prep HPF POC FEW (A) NONE SEEN   Sperm PRESENT    Meds ordered this encounter  Medications   Prenatal Vit-Fe Fumarate-FA (PREPLUS) 27-1 MG TABS    Sig: Take 1 tablet by mouth daily.    Dispense:  30 tablet    Refill:  13    Order Specific Question:   Supervising Provider    Answer:   Alysia Penna, MICHAEL L [[id number]]   terconazole (TERAZOL 3) 0.8 % vaginal cream    Sig: Place 1 applicator vaginally at bedtime. Apply nightly for three nights.    Dispense:  20 g    Refill:  0    Order Specific Question:   Supervising Provider    Answer:    ERVIN, MICHAEL L [[id number]]   US OB LESS THAN 14 WEEKS WITH OB TRANSVAGINAL  Result Date: 03/06/2021 CLINICAL DATA:  Abdominal pain EXAM: OBSTETRIC <14 WK [place] AND TRANSVAGINAL OB US TECHNIQUE: Both transabdominal and transvaginal ultrasound examinations were performed for complete evaluation of the gestation as well as the maternal uterus, adnexal regions, and pelvic cul-de-sac. Transvaginal technique was performed to assess early pregnancy. COMPARISON:  None. FINDINGS: Intrauterine gestational sac: None Yolk sac:  Not Visualized. Embryo:  Not Visualized. Cardiac Activity: Not Visualized. Heart Rate:   bpm MSD:   mm    w     d CRL:    mm    w    d                  [place] EDC: Subchorionic hemorrhage:  None visualized. Maternal uterus/adnexae: No adnexal mass. Small amount of free  fluid in the cul-de-sac. Uterus is retroverted. IMPRESSION: No intrauterine pregnancy visualized. Differential considerations would include early intrauterine pregnancy too early to visualize, spontaneous abortion, or occult ectopic pregnancy. Recommend close clinical followup and serial quantitative beta HCGs and ultrasounds. Electronically Signed   By: Charlett Nose M.D.   On: 03/06/2021 12:02    Assessment and Plan  --30 y.o. [id number] with PUL --Quant hCG 1331 --Vulvovaginal candidiasis --Urine culture in work --Discharge home in stable condition  F/U: --Return to MAU Sunday 09/18 for repeat stat Quant hCG  Calvert Cantor, CNM 03/06/2021, 1:13 PM

## 2021-03-06 NOTE — MAU Note (Signed)
Cramping started on Monday, felt like period cramps. Now is radiating down into legs and having stabbing pain on left side.  Yesterday had 2 +HPT.  Hx of miscarriage last year, is scared this is happening again. Denies bleeding.

## 2021-03-07 LAB — CULTURE, OB URINE

## 2021-03-08 ENCOUNTER — Other Ambulatory Visit: Payer: Self-pay

## 2021-03-08 ENCOUNTER — Inpatient Hospital Stay (HOSPITAL_COMMUNITY)
Admission: AD | Admit: 2021-03-08 | Discharge: 2021-03-08 | Disposition: A | Discharge status: Home / Self Care | Payer: BC Managed Care – PPO | Attending: Obstetrics & Gynecology | Admitting: Obstetrics & Gynecology

## 2021-03-08 DIAGNOSIS — Z3A01 Less than 8 weeks gestation of pregnancy: Secondary | ICD-10-CM

## 2021-03-08 DIAGNOSIS — O3680X0 Pregnancy with inconclusive fetal viability, not applicable or unspecified: Secondary | ICD-10-CM | POA: Diagnosis not present

## 2021-03-08 DIAGNOSIS — Z09 Encounter for follow-up examination after completed treatment for conditions other than malignant neoplasm: Secondary | ICD-10-CM

## 2021-03-08 DIAGNOSIS — E349 Endocrine disorder, unspecified: Secondary | ICD-10-CM

## 2021-03-08 LAB — HCG, QUANTITATIVE, PREGNANCY: hCG, Beta Chain, Quant, S: 3285 m[IU]/mL — ABNORMAL HIGH (ref <5)

## 2021-03-08 NOTE — MAU Provider Note (Signed)
History   Chief Complaint:  Follow-up   Colleen Daniels is  30 y.o. [phone] Patient's last menstrual period was 02/03/2021.Marland Kitchen Patient is here for follow up of quantitative HCG and ongoing surveillance of pregnancy status. She is [redacted]w[redacted]d weeks gestation  by LMP.    Since her last visit, the patient is without new complaint. The patient reports bleeding as  none now.  She denies any pain.  General ROS:  negative  Her previous Quantitative HCG values are:  Results for Colleen Daniels, Colleen Daniels (MRN [id number]) as of 03/08/2021 12:19  Ref. Range 03/06/2021 10:42  HCG, Beta Chain, Quant, S Latest Ref Range: <5 mIU/mL 1,331 (H)   Physical Exam   Blood pressure (!) 106/55, pulse 66, temperature 98 F (36.7 C), temperature source Oral, resp. rate 16, last menstrual period 02/03/2021, SpO2 100 %.  Physical Exam Vitals and nursing note reviewed.  Constitutional:      General: She is not in acute distress.    Appearance: She is well-developed.  HENT:     Head: Normocephalic.  Eyes:     Pupils: Pupils are equal, round, and reactive to light.  Cardiovascular:     Rate and Rhythm: Normal rate.  Pulmonary:     Effort: Pulmonary effort is normal. No respiratory distress.  Abdominal:     Tenderness: There is no abdominal tenderness.  Musculoskeletal:        General: Normal range of motion.     Cervical back: Normal range of motion.  Skin:    General: Skin is warm and dry.  Neurological:     Mental Status: She is alert and oriented to person, place, and time.  Psychiatric:        Behavior: Behavior normal.        Thought Content: Thought content normal.        Judgment: Judgment normal.   Labs: Results for orders placed or performed during the hospital encounter of 03/08/21 (from the past 24 hour(s))  hCG, quantitative, pregnancy   Collection Time: 03/08/21 10:09 AM  Result Value Ref Range   hCG, Beta Chain, Quant, S 3,285 (H) <5 mIU/mL   Assessment:   1. Elevated serum hCG    2. [redacted] weeks gestation of pregnancy   3. Follow up     Reviewed an appropriate rise in HCG and recommendation for repeat ultrasound in 10-14 days.   Plan: -Discharge home in stable condition -First trimester precautions discussed -Patient advised to follow-up with [facility] in 10-14 days for repeat ultrasound, order placed -Patient may return to MAU as needed or if her condition were to change or worsen  Rolm Bookbinder, CNM 03/08/2021, 12:19 PM

## 2021-03-08 NOTE — MAU Note (Signed)
Colleen Daniels is a 30 y.o. at [redacted]w[redacted]d here in MAU reporting: here for follow up hcg. Denies bleeding but has continued to have pain. Reports pain has not gotten any worse.  Onset of complaint: today  Pain score: 8/10  Vitals:   03/08/21 1018  BP: (!) 106/55  Pulse: 66  Resp: 16  Temp: 98 F (36.7 C)  SpO2: 100%     Lab orders placed from triage: hcg

## 2021-03-09 LAB — GC/CHLAMYDIA PROBE AMP (~~LOC~~) NOT AT ARMC
Chlamydia: NEGATIVE
Comment: NEGATIVE
Comment: NORMAL
Neisseria Gonorrhea: NEGATIVE

## 2021-03-19 ENCOUNTER — Other Ambulatory Visit: Payer: Self-pay

## 2021-03-19 ENCOUNTER — Ambulatory Visit (INDEPENDENT_AMBULATORY_CARE_PROVIDER_SITE_OTHER): Payer: BC Managed Care – PPO

## 2021-03-19 ENCOUNTER — Ambulatory Visit
Admission: RE | Admit: 2021-03-19 | Discharge: 2021-03-19 | Disposition: A | Discharge status: Home / Self Care | Payer: BC Managed Care – PPO | Source: Ambulatory Visit

## 2021-03-19 DIAGNOSIS — E349 Endocrine disorder, unspecified: Secondary | ICD-10-CM

## 2021-03-19 DIAGNOSIS — Z09 Encounter for follow-up examination after completed treatment for conditions other than malignant neoplasm: Secondary | ICD-10-CM | POA: Insufficient documentation

## 2021-03-19 DIAGNOSIS — Z3A01 Less than 8 weeks gestation of pregnancy: Secondary | ICD-10-CM | POA: Diagnosis not present

## 2021-03-19 NOTE — Patient Instructions (Addendum)
Prenatal Care Providers   [facility]       [address] [phone]    [facility]  Phone: [phone]  [facility] Phone: [phone]  [facility] Phone: [phone]  [facility] Phone: [phone]  [facility] Phone: [phone]  [facility]  Phone: [phone]   Safe Medications in Pregnancy   Acne:  Benzoyl Peroxide  Salicylic Acid   Backache/Headache:  Tylenol: 2 regular strength every 4 hours OR               2 Extra strength every 6 hours   Colds/Coughs/Allergies:  Benadryl (alcohol free) 25 mg every 6 hours as needed  Breath right strips  Claritin  Cepacol throat lozenges  Chloraseptic throat spray  Cold-Eeze- up to three times per day  Cough drops, alcohol free  Flonase (by prescription only)  Guaifenesin  Mucinex  Robitussin DM (plain only, alcohol free)  Saline nasal spray/drops  Sudafed (pseudoephedrine) & Actifed * use only after [redacted] weeks gestation and if you do not have high blood pressure  Tylenol  Vicks Vaporub  Zinc lozenges  Zyrtec   Constipation:  Colace  Ducolax suppositories  Fleet enema  Glycerin suppositories  Metamucil  Milk of magnesia  Miralax  Senokot  Smooth move tea   Diarrhea:  Kaopectate  Imodium A-D   *NO pepto Bismol   Hemorrhoids:  Anusol  Anusol HC  Preparation H  Tucks   Indigestion:  Tums  Maalox  Mylanta  Zantac  Pepcid   Insomnia:  Benadryl (alcohol free) 25mg  every 6 hours as needed  Tylenol PM  Unisom, no Gelcaps   Leg Cramps:  Tums  MagGel   Nausea/Vomiting:  Bonine  Dramamine  Emetrol  Ginger extract  Sea bands  Meclizine  Nausea medication to take during pregnancy:  Unisom (doxylamine succinate 25 mg tablets) Take one tablet daily at bedtime. If symptoms are not adequately controlled, the dose can be increased to a maximum recommended dose of two  tablets daily (1/2 tablet in the morning, 1/2 tablet mid-afternoon and one at bedtime).  Vitamin B6 100mg  tablets. Take one tablet twice a day (up to 200 mg per day).   Skin Rashes:  Aveeno products  Benadryl cream or 25mg  every 6 hours as needed  Calamine Lotion  1% cortisone cream   Yeast infection:  Gyne-lotrimin 7  Monistat 7    **If taking multiple medications, please check labels to avoid duplicating the same active ingredients  **take medication as directed on the label  ** Do not exceed 4000 mg of tylenol in 24 hours  **Do not take medications that contain aspirin or ibuprofen

## 2021-03-19 NOTE — Progress Notes (Signed)
Reviewed official US report with Dr Alvester Morin. Advised normal IUP [place] results with FHR. Pt advised to start OB care and PNV.  Call placed to pt. Pt given results per Dr Alvester Morin. Pt agreeable to plan of care. Pt asked for safe pregnancy medications to take and list of OB providers in area. List given via mychart.  Judeth Cornfield, RN

## 2021-03-19 NOTE — Progress Notes (Signed)
Pt here today for OB US results. Pt has normal OB US with FHR, but official report not back yet. Pt will be called when report result.  Pt advised to start OB care and list given. Pt verbalized understanding and agreeable to plan of care.   Judeth Cornfield, RN

## 2021-03-20 ENCOUNTER — Ambulatory Visit (HOSPITAL_COMMUNITY): Payer: BC Managed Care – PPO

## 2021-03-20 NOTE — Progress Notes (Signed)
Attestation of Attending Supervision of Resident: Evaluation and management procedures were performed by the RN under my supervision.    Evidence of viable pregnancy, recommend starting OB care  Federico Flake, MD 10:29 AM

## 2021-04-09 ENCOUNTER — Encounter: Payer: Self-pay | Admitting: *Deleted

## 2021-04-22 ENCOUNTER — Telehealth (INDEPENDENT_AMBULATORY_CARE_PROVIDER_SITE_OTHER): Payer: BC Managed Care – PPO

## 2021-04-22 DIAGNOSIS — O09299 Supervision of pregnancy with other poor reproductive or obstetric history, unspecified trimester: Secondary | ICD-10-CM

## 2021-04-22 DIAGNOSIS — O4290 Premature rupture of membranes, unspecified as to length of time between rupture and onset of labor, unspecified weeks of gestation: Secondary | ICD-10-CM

## 2021-04-22 DIAGNOSIS — R519 Headache, unspecified: Secondary | ICD-10-CM | POA: Insufficient documentation

## 2021-04-22 DIAGNOSIS — O0990 Supervision of high risk pregnancy, unspecified, unspecified trimester: Secondary | ICD-10-CM | POA: Insufficient documentation

## 2021-04-22 DIAGNOSIS — Z3A00 Weeks of gestation of pregnancy not specified: Secondary | ICD-10-CM

## 2021-04-22 NOTE — Progress Notes (Signed)
New OB Intake  I connected with  Colleen Daniels on 04/22/21 at  8:15 AM EDT by MyChart Video Visit and verified that I am speaking with the correct person using two identifiers. Nurse is located at [facility] and pt is located at home.  I discussed the limitations, risks, security and privacy concerns of performing an evaluation and management service by telephone and the availability of in person appointments. I also discussed with the patient that there may be a patient responsible charge related to this service. The patient expressed understanding and agreed to proceed.  I explained I am completing New OB Intake today. We discussed her EDD of 11/10/21 that is based on LMP of 02/03/21. Pt is G4/P3. I reviewed her allergies, medications, Medical/Surgical/OB history, and appropriate screenings. I informed her of [facility] services. Based on history, this is a/an  pregnancy complicated by prom  .   Patient Active Problem List   Diagnosis Date Noted   PID (pelvic inflammatory disease) 07/30/2020   IBS (irritable bowel syndrome) 07/30/2020   Pelvic pain 04/28/2019   Dysuria 04/28/2019    Concerns addressed today  Delivery Plans:  Plans to deliver at [facility].   MyChart/Babyscripts MyChart access verified. I explained pt will have some visits in office and some virtually. Babyscripts instructions given and order placed. Patient verifies receipt of registration text/e-mail. Account successfully created and app downloaded.  Blood Pressure Cuff  Patient is self-pay; explained patient will be given BP cuff at first prenatal appt. Explained after first prenatal appt pt will check weekly and document in Babyscripts.Pt has own BP Cuff.  Weight scale: Patient    have weight scale. Weight scale ordered for patient to pick up form [facility].   Anatomy US Explained first scheduled Korea will be around 19 weeks. Anatomy US scheduled for 06/17/21 at 10:15a. Pt notified to arrive at  10:00a.  Labs Discussed [name] genetic screening with patient. Would like both Panorama and Horizon drawn at new OB visit. Routine prenatal labs needed.  Covid Vaccine Patient has not covid vaccine.   Mother/ Baby Dyad Candidate?    If yes, offer as possibility  Informed patient of [facility] website  and placed link in her AVS.   Social Determinants of Health Food Insecurity: Patient denies food insecurity. WIC Referral: Patient is interested in referral to [facility].  Transportation: Patient denies transportation needs. Childcare: Discussed no children allowed at ultrasound appointments. Offered childcare services; patient declines childcare services at this time.  Send link to Pregnancy Navigators   Placed OB Box on problem list and updated  First visit review I reviewed new OB appt with pt. I explained she will have a pelvic exam, ob bloodwork with genetic screening, and PAP smear. Explained pt will be seen by [name], CNM at first visit; encounter routed to appropriate provider. Explained that patient will be seen by pregnancy navigator following visit with provider. [facility] information placed in AVS.   [name], CMA 04/22/2021  8:24 AM

## 2021-04-22 NOTE — Patient Instructions (Signed)
  At our [facility], we work as an integrated team, providing care to address both physical and emotional health. Your medical provider may refer you to see our Behavioral Health Clinician (BHC) on the same day you see your medical provider, as availability permits; often scheduled virtually at your convenience.  Our BHC is available to all patients, visits generally last between 20-30 minutes, but can be longer or shorter, depending on patient need. The BHC offers help with stress management, coping with symptoms of depression and anxiety, major life changes , sleep issues, changing risky behavior, grief and loss, life stress, working on personal life goals, and  behavioral health issues, as these all affect your overall health and wellness.  The BHC is NOT available for the following: FMLA paperwork, court-ordered evaluations, specialty assessments (custody or disability), letters to employers, or obtaining certification for an emotional support animal. The BHC does not provide long-term therapy. You have the right to refuse integrated behavioral health services, or to reschedule to see the BHC at a later date.  Confidentiality exception: If it is suspected that a child or disabled adult is being abused or neglected, we are required by law to report that to either Child Protective Services or Adult Protective Services.  If you have a diagnosis of Bipolar affective disorder, Schizophrenia, or recurrent Major depressive disorder, we will recommend that you establish care with a psychiatrist, as these are lifelong, chronic conditions, and we want your overall emotional health and medications to be more closely monitored. If you anticipate needing extended maternity leave due to mental health issues postpartum, it it recommended you inform your medical provider, so we can put in a referral to a psychiatrist as soon as possible. The BHC is unable to recommend an extended maternity leave for mental  health issues. Your medical provider or BHC may refer you to a therapist for ongoing, traditional therapy, or to a psychiatrist, for medication management, if it would benefit your overall health. Depending on your insurance, you may have a copay or be charged a deductible, depending on your insurance, to see the BHC. If you are uninsured, it is recommended that you apply for financial assistance. (Forms may be requested at the front desk for in-person visits, via MyChart, or request a form during a virtual visit).  If you see the BHC more than 6 times, you will have to complete a comprehensive clinical assessment interview with the BHC to resume integrated services.  For virtual visits with the BHC, you must be physically in the state of [place] at the time of the visit. For example, if you live in [place], you will have to do an in-person visit with the BHC, and your out-of-state insurance may not cover behavioral health services in [place]. If you are going out of the state or country for any reason, the BHC may see you virtually when you return to [place], but not while you are physically outside of [place].    

## 2021-04-23 ENCOUNTER — Telehealth: Payer: Self-pay | Admitting: *Deleted

## 2021-04-23 NOTE — Telephone Encounter (Signed)
Patients chart held for surgery scheduling.  Per review of Epic, patient currently been followed for pregnancy at [facility].   Will remove.   Routing to Dr. Marjorie Smolder.   Encounter closed.

## 2021-04-29 ENCOUNTER — Other Ambulatory Visit: Payer: Self-pay

## 2021-04-29 ENCOUNTER — Ambulatory Visit (INDEPENDENT_AMBULATORY_CARE_PROVIDER_SITE_OTHER): Payer: Self-pay | Admitting: Obstetrics and Gynecology

## 2021-04-29 ENCOUNTER — Other Ambulatory Visit (HOSPITAL_COMMUNITY)
Admission: RE | Admit: 2021-04-29 | Discharge: 2021-04-29 | Disposition: A | Discharge status: Home / Self Care | Payer: BC Managed Care – PPO | Source: Ambulatory Visit | Attending: Obstetrics and Gynecology | Admitting: Obstetrics and Gynecology

## 2021-04-29 VITALS — BP 111/58 | HR 66 | Wt 197.2 lb

## 2021-04-29 DIAGNOSIS — O0990 Supervision of high risk pregnancy, unspecified, unspecified trimester: Secondary | ICD-10-CM

## 2021-04-29 DIAGNOSIS — E669 Obesity, unspecified: Secondary | ICD-10-CM | POA: Insufficient documentation

## 2021-04-29 DIAGNOSIS — N8110 Cystocele, unspecified: Secondary | ICD-10-CM

## 2021-04-29 DIAGNOSIS — O3481 Maternal care for other abnormalities of pelvic organs, first trimester: Secondary | ICD-10-CM

## 2021-04-29 DIAGNOSIS — R519 Headache, unspecified: Secondary | ICD-10-CM

## 2021-04-29 MED ORDER — METOCLOPRAMIDE HCL 10 MG PO TABS: 10.0000 mg | ORAL_TABLET | ORAL | 1 refills | Status: DC | PRN

## 2021-04-29 NOTE — Addendum Note (Signed)
Addended by: Guy Begin on: 04/29/2021 02:51 PM   Modules accepted: Orders

## 2021-04-29 NOTE — Progress Notes (Signed)
History:   Colleen Daniels is a 30 y.o. [phone] at [redacted]w[redacted]d by LMP being seen today for her first obstetrical visit.  Her obstetrical history is significant for obesity. Patient does intend to breast feed. Pregnancy history fully reviewed.  Frequent HA's recently. Has been taking tylenol daily. Hx of migraines. Tylenol does help her HA's.Hx of pelvic pain with previous pregnancy. Concerns she may have a prolapse. She notices this with coughing and intercourse.   Patient reports no complaints.  HISTORY: OB History  Gravida Para Term Preterm AB Living  4 3 3  0 0 3  SAB IAB Ectopic Multiple Live Births  0 0 0 0 3    # Outcome Date GA Lbr Len/2nd Weight Sex Delivery Anes PTL Lv  4 Current           3 Term 06/15/15 [redacted]w[redacted]d 10:30 5 lb 13.1 oz (2.64 kg) M Vag-Spont None  LIV     Name: MALPICA CORTEZ,BOY Rinoa     Apgar1: 9  Apgar5: 9  2 Term 12/16/11 [redacted]w[redacted]d 273:28 / 00:02 6 lb 5 oz (2.863 kg) F Vag-Spont None  LIV     Name: MALPICA CORTEZ,GIRL Karcyn     Apgar1: 9  Apgar5: 9  1 Term  [redacted]w[redacted]d  5 lb 3 oz (2.353 kg) M Vag-Spont None  LIV    Last pap smear was done 2020 and was normal  Past Medical History:  Diagnosis Date   Allergy    Seasonal   COVID-19 10/2018   developed pneumonia   Dysmenorrhea    Hepatitis B    as a child   IBS (irritable bowel syndrome)    No pertinent past medical history    Pelvic congestion syndrome    Past Surgical History:  Procedure Laterality Date   COLONOSCOPY  2016   done in 2017 - confirmed IBS mixed    Family History  Problem Relation Age of Onset   Stroke Maternal Grandmother    Diabetes Maternal Grandmother    Hypertension Maternal Grandmother    Heart attack Maternal Grandmother    Osteoporosis Maternal Grandmother    Hyperlipidemia Paternal Grandmother    Social History   Tobacco Use   Smoking status: Former    Types: Cigarettes    Quit date: 06/21/2008    Years since quitting: 12.8   Smokeless tobacco: Never   Vaping Use   Vaping Use: Never used  Substance Use Topics   Alcohol use: No   Drug use: No   No Known Allergies Current Outpatient Medications on File Prior to Visit  Medication Sig Dispense Refill   acetaminophen (TYLENOL) 325 MG tablet Take 650 mg by mouth every 6 (six) hours as needed.     loratadine (CLARITIN) 10 MG tablet Take 10 mg by mouth daily.     Prenatal Vit-Fe Fumarate-FA (PREPLUS) 27-1 MG TABS Take 1 tablet by mouth daily. 30 tablet 13   terconazole (TERAZOL 3) 0.8 % vaginal cream Place 1 applicator vaginally at bedtime. Apply nightly for three nights. (Patient not taking: Reported on 04/29/2021) 20 g 0   zinc gluconate 50 MG tablet Take 50 mg by mouth daily. (Patient not taking: Reported on 04/29/2021)     [DISCONTINUED] pantoprazole (PROTONIX) 40 MG tablet Take 1 tablet (40 mg total) by mouth daily. (Patient not taking: No sig reported) 30 tablet 0   No current facility-administered medications on file prior to visit.    Review of Systems Pertinent items noted in HPI and remainder  of comprehensive ROS otherwise negative.  Physical Exam:   Vitals:   04/29/21 1400  BP: (!) 111/58  Pulse: 66  Weight: 197 lb 3.2 oz (89.4 kg)   Fetal Heart Rate (bpm): 149  Uterine size:    Patient informed that the ultrasound is considered a limited obstetric ultrasound and is not intended to be a complete ultrasound exam.  Patient also informed that the ultrasound is not being completed with the intent of assessing for fetal or placental anomalies or any pelvic abnormalities.  Explained that the purpose of today's ultrasound is to assess for fetal heart rate.  Patient acknowledges the purpose of the exam and the limitations of the study. General: well-developed, well-nourished female in no acute distress  Skin: normal coloration and turgor, no rashes  Neurologic: oriented, normal, negative, normal mood  Extremities: normal strength, tone, and muscle mass, ROM of all joints is normal   HEENT PERRLA, extraocular movement intact and sclera clear, anicteric  Neck supple and no masses  Cardiovascular: regular rate and rhythm  Respiratory:  no respiratory distress, normal breath sounds  Abdomen: soft, non-tender; bowel sounds normal; no masses,  no organomegaly  Pelvic: normal external genitalia, Cystocele noted,  no lesions, normal vaginal mucosa, normal vaginal discharge, normal cervix Exam done in the presence of a chaperone.     Assessment:    Pregnancy: [id number] Patient Active Problem List   Diagnosis Date Noted   Obesity 04/29/2021   Supervision of high risk pregnancy, antepartum 04/22/2021   Frequent headaches 04/22/2021   PID (pelvic inflammatory disease) 07/30/2020   IBS (irritable bowel syndrome) 07/30/2020     Plan:    1. Supervision of high risk pregnancy, antepartum  - GC/Chlamydia probe amp ([place])not at [facility] - Genetic Screening - Hemoglobin A1c - Culture, OB Urine - CBC/D/Plt+RPR+Rh+ABO+RubIgG... - Ambulatory referral to Physical Therapy  2. Frequent headaches  Rx: Reglan  Ok to use Ibuprofen sparingly.   3. Obesity, unspecified classification, unspecified obesity type, unspecified whether serious comorbidity present  Start BASA daily    Welcomed to practice.  Initial labs drawn. Continue prenatal vitamins. Problem list reviewed and updated. Genetic Screening discussed, NIPS: declined. Ultrasound discussed; fetal anatomic survey: ordered. Anticipatory guidance about prenatal visits given including labs, ultrasounds, and testing. Discussed usage of Babyscripts and virtual visits as additional source of managing and completing prenatal visits in midst of coronavirus and pandemic.   Encouraged to complete MyChart Registration for her ability to review results, send requests, and have questions addressed.  The nature of [place] [facility] Healthcare/Faculty Practice with multiple MDs and Advanced Practice Providers was  explained to patient; also emphasized that residents, students are part of our team. Routine obstetric precautions reviewed. Encouraged to seek out care at office or emergency room [facility] MAU preferred) for urgent and/or emergent concerns. Return in about 4 weeks (around 05/27/2021), or app scheduled is ok.Duane Lope, NP  Faculty Practice [facility], [facility]

## 2021-04-30 LAB — CBC/D/PLT+RPR+RH+ABO+RUBIGG...
Antibody Screen: NEGATIVE
Basophils Absolute: 0.1 10*3/uL (ref 0.0–0.2)
Basos: 1 %
EOS (ABSOLUTE): 0.1 10*3/uL (ref 0.0–0.4)
Eos: 1 %
HCV Ab: <0.1 s/co ratio (ref 0.0–0.9)
HIV Screen 4th Generation wRfx: NONREACTIVE
Hematocrit: 37.5 % (ref 34.0–46.6)
Hemoglobin: 12.5 g/dL (ref 11.1–15.9)
Hepatitis B Surface Ag: NEGATIVE
Immature Grans (Abs): 0 10*3/uL (ref 0.0–0.1)
Immature Granulocytes: 0 %
Lymphocytes Absolute: 1.6 10*3/uL (ref 0.7–3.1)
Lymphs: 15 %
MCH: 28.9 pg (ref 26.6–33.0)
MCHC: 33.3 g/dL (ref 31.5–35.7)
MCV: 87 fL (ref 79–97)
Monocytes Absolute: 0.6 10*3/uL (ref 0.1–0.9)
Monocytes: 6 %
Neutrophils Absolute: 8.3 10*3/uL — ABNORMAL HIGH (ref 1.4–7.0)
Neutrophils: 77 %
Platelets: 297 10*3/uL (ref 150–450)
RBC: 4.32 x10E6/uL (ref 3.77–5.28)
RDW: 13.3 % (ref 11.7–15.4)
RPR Ser Ql: NONREACTIVE
Rh Factor: POSITIVE
Rubella Antibodies, IGG: 1.44 index (ref >0.99)
WBC: 10.6 10*3/uL (ref 3.4–10.8)

## 2021-04-30 LAB — HEMOGLOBIN A1C
Est. average glucose Bld gHb Est-mCnc: 100 mg/dL
Hgb A1c MFr Bld: 5.1 % (ref 4.8–5.6)

## 2021-04-30 LAB — GC/CHLAMYDIA PROBE AMP (~~LOC~~) NOT AT ARMC
Chlamydia: NEGATIVE
Comment: NEGATIVE
Comment: NORMAL
Neisseria Gonorrhea: NEGATIVE

## 2021-04-30 LAB — HCV INTERPRETATION

## 2021-05-01 LAB — CULTURE, OB URINE

## 2021-05-01 LAB — URINE CULTURE, OB REFLEX

## 2021-05-12 ENCOUNTER — Telehealth: Payer: Self-pay | Admitting: Family Medicine

## 2021-05-12 NOTE — Telephone Encounter (Signed)
Call placed to pt. Spoke with pt. Pt states wanting to change anatomy scan to [facility] since she is apart of the [employer] program and no longer has [employer] [facility]. Pt advised too late now to call [facility] to reschedule, but to ask at next OB appt for new Korea appt. Pt has next OB appt on 12/7. Pt verbalized understanding and agreeable to plan of care. Judeth Cornfield, RN

## 2021-05-12 NOTE — Telephone Encounter (Signed)
Patient is requesting her ultra sound be scheduled at [facility]

## 2021-05-27 ENCOUNTER — Telehealth: Payer: BC Managed Care – PPO | Admitting: Obstetrics and Gynecology

## 2021-06-01 ENCOUNTER — Telehealth: Payer: Self-pay | Admitting: Family Medicine

## 2021-06-01 NOTE — Telephone Encounter (Signed)
Returned pt's call and pt informed me that she has an U/S scheduled on 06/17/21 in [facility] and [employer] does not cover it.  I informed pt that she is correct the program does not cover U/S in [facility].  I called [facility] to schedule her anatomy scan and was informed that they do not have any availability left this month and the January schedule is not open yet.  I informed pt that we can schedule her [facility] U/S at her visit on 06/16/21.  Pt states that she was not aware of her appt on 12/27 she thought that it was on 06/12/21.  Pt stated that she was not able to do the appt on 12/27.  I informed pt that I would cancel her appt with [facility] scheduled on 12/28 and transfer her call to the front office to be rescheduled.  I also encouraged pt to make sure that whenever she is scheduled at the appt to see if we can not make her [facility] U/S appt then.  Pt verbalized understanding with no further questions.   Leonette Nutting  06/01/21

## 2021-06-01 NOTE — Telephone Encounter (Signed)
Patient need to talk to someone about getting a [place] Scheduled at [facility]

## 2021-06-10 ENCOUNTER — Other Ambulatory Visit: Payer: Self-pay

## 2021-06-10 ENCOUNTER — Other Ambulatory Visit (HOSPITAL_COMMUNITY)
Admission: RE | Admit: 2021-06-10 | Discharge: 2021-06-10 | Disposition: A | Discharge status: Home / Self Care | Payer: Self-pay | Source: Ambulatory Visit | Attending: Obstetrics and Gynecology | Admitting: Obstetrics and Gynecology

## 2021-06-10 ENCOUNTER — Ambulatory Visit (INDEPENDENT_AMBULATORY_CARE_PROVIDER_SITE_OTHER): Payer: Self-pay | Admitting: Nurse Practitioner

## 2021-06-10 VITALS — BP 115/62 | HR 68 | Wt 196.5 lb

## 2021-06-10 DIAGNOSIS — N898 Other specified noninflammatory disorders of vagina: Secondary | ICD-10-CM | POA: Insufficient documentation

## 2021-06-10 DIAGNOSIS — O0990 Supervision of high risk pregnancy, unspecified, unspecified trimester: Secondary | ICD-10-CM

## 2021-06-10 DIAGNOSIS — O26892 Other specified pregnancy related conditions, second trimester: Secondary | ICD-10-CM

## 2021-06-10 DIAGNOSIS — Z5941 Food insecurity: Secondary | ICD-10-CM

## 2021-06-10 DIAGNOSIS — L299 Pruritus, unspecified: Secondary | ICD-10-CM

## 2021-06-10 NOTE — Progress Notes (Signed)
Anatomy ultrasound scheduled for at 06/29/21 at 11 AM at [facility] Department. Patient notified.

## 2021-06-10 NOTE — Patient Instructions (Addendum)
Anatomy ultrasound scheduled for 06/29/21 at 11 AM at the [facility] Department. Located at [address], [place].   Benadryl if needed at night for itching.

## 2021-06-10 NOTE — Progress Notes (Signed)
Subjective:  Colleen Daniels is a 30 y.o. [phone] at 51w1dbeing seen today for ongoing prenatal care.  She is currently monitored for the following issues for this low-risk pregnancy and has PID (pelvic inflammatory disease); IBS (irritable bowel syndrome); Supervision of high risk pregnancy, antepartum; Frequent headaches; and Obesity on their problem list.  Patient reports  irching on arms and legs - none on palms or soles .  Contractions: Not present. Vag. Bleeding: None.  Movement: Absent. Denies leaking of fluid.   The following portions of the patient's history were reviewed and updated as appropriate: allergies, current medications, past family history, past medical history, past social history, past surgical history and problem list. Problem list updated.  Objective:   Vitals:   06/10/21 1528  BP: 115/62  Pulse: 68  Weight: 196 lb 8 oz (89.1 kg)    Fetal Status: Fetal Heart Rate (bpm): 140   Movement: Absent     General:  Alert, oriented and cooperative. Patient is in no acute distress.  Skin: Skin is warm and dry. No rash noted.   Cardiovascular: Normal heart rate noted  Respiratory: Normal respiratory effort, no problems with respiration noted  Abdomen: Soft, gravid, appropriate for gestational age. Pain/Pressure: Present     Pelvic:  Cervical exam deferred        Extremities: Normal range of motion.  Edema: Trace  Mental Status: Normal mood and affect. Normal behavior. Normal judgment and thought content.   Urinalysis:      Assessment and Plan:  Pregnancy: [id number] at 177w1d1. Supervision of high risk pregnancy, antepartum Will evaluate for cholestasis due to itching Advised Benadryl at night if needed for sleeping Reviewed management of itchy skin due to dryness or allergy  - Comp Met (CMET) - Bile acids, total  2. Itching See abofe  3. Vaginal discharge during pregnancy in second trimester Self swab done  4. Food insecurity  - AMBULATORY  REFERRAL TO BRArapahoeOOD PROGRAM  Preterm labor symptoms and general obstetric precautions including but not limited to vaginal bleeding, contractions, leaking of fluid and fetal movement were reviewed in detail with the patient. Please refer to After Visit Summary for other counseling recommendations.  Return in about 4 weeks (around 07/08/2021) for in person ROB.  TEEarlie ServerRN, MSN, NP-BC Nurse Practitioner, [facility] [employer] 06/10/2021 4:08 PM

## 2021-06-11 LAB — COMPREHENSIVE METABOLIC PANEL
ALT: 15 IU/L (ref 0–32)
AST: 16 IU/L (ref 0–40)
Albumin/Globulin Ratio: 1.5 (ref 1.2–2.2)
Albumin: 3.8 g/dL (ref 3.8–4.8)
Alkaline Phosphatase: 71 IU/L (ref 44–121)
BUN/Creatinine Ratio: 18 (ref 9–23)
BUN: 10 mg/dL (ref 6–20)
Bilirubin Total: 0.3 mg/dL (ref 0.0–1.2)
CO2: 21 mmol/L (ref 20–29)
Calcium: 9.2 mg/dL (ref 8.7–10.2)
Chloride: 104 mmol/L (ref 96–106)
Creatinine, Ser: 0.56 mg/dL — ABNORMAL LOW (ref 0.57–1.00)
Globulin, Total: 2.6 g/dL (ref 1.5–4.5)
Glucose: 76 mg/dL (ref 70–99)
Potassium: 4.2 mmol/L (ref 3.5–5.2)
Sodium: 137 mmol/L (ref 134–144)
Total Protein: 6.4 g/dL (ref 6.0–8.5)
eGFR: 125 mL/min/{1.73_m2} (ref >59)

## 2021-06-11 LAB — CERVICOVAGINAL ANCILLARY ONLY
Bacterial Vaginitis (gardnerella): NEGATIVE
Candida Glabrata: NEGATIVE
Candida Vaginitis: POSITIVE — AB
Chlamydia: NEGATIVE
Comment: NEGATIVE
Comment: NEGATIVE
Comment: NEGATIVE
Comment: NEGATIVE
Comment: NEGATIVE
Comment: NORMAL
Neisseria Gonorrhea: NEGATIVE
Trichomonas: NEGATIVE

## 2021-06-11 LAB — BILE ACIDS, TOTAL: Bile Acids Total: 3.3 umol/L (ref 0.0–10.0)

## 2021-06-11 MED ORDER — TERCONAZOLE 0.8 % VA CREA: 1.0000 | TOPICAL_CREAM | Freq: Every day | VAGINAL | 0 refills | Status: DC

## 2021-06-16 ENCOUNTER — Encounter: Payer: Self-pay | Admitting: Family Medicine

## 2021-06-17 ENCOUNTER — Ambulatory Visit: Payer: BC Managed Care – PPO

## 2021-06-17 ENCOUNTER — Other Ambulatory Visit: Payer: BC Managed Care – PPO

## 2021-06-20 ENCOUNTER — Inpatient Hospital Stay (HOSPITAL_COMMUNITY)
Admission: AD | Admit: 2021-06-20 | Discharge: 2021-06-20 | Disposition: A | Discharge status: Home / Self Care | Payer: Self-pay | Attending: Obstetrics & Gynecology | Admitting: Obstetrics & Gynecology

## 2021-06-20 ENCOUNTER — Other Ambulatory Visit: Payer: Self-pay

## 2021-06-20 DIAGNOSIS — Z3A19 19 weeks gestation of pregnancy: Secondary | ICD-10-CM | POA: Insufficient documentation

## 2021-06-20 DIAGNOSIS — H9203 Otalgia, bilateral: Secondary | ICD-10-CM | POA: Insufficient documentation

## 2021-06-20 DIAGNOSIS — O26892 Other specified pregnancy related conditions, second trimester: Secondary | ICD-10-CM | POA: Insufficient documentation

## 2021-06-20 DIAGNOSIS — Z20822 Contact with and (suspected) exposure to covid-19: Secondary | ICD-10-CM | POA: Insufficient documentation

## 2021-06-20 DIAGNOSIS — J029 Acute pharyngitis, unspecified: Secondary | ICD-10-CM | POA: Insufficient documentation

## 2021-06-20 LAB — GROUP A STREP BY PCR: Group A Strep by PCR: NOT DETECTED

## 2021-06-20 LAB — RESP PANEL BY RT-PCR (FLU A&B, COVID) ARPGX2
Influenza A by PCR: NEGATIVE
Influenza B by PCR: NEGATIVE
SARS Coronavirus 2 by RT PCR: NEGATIVE

## 2021-06-20 NOTE — MAU Note (Signed)
Colleen Daniels is a 30 y.o. at [redacted]w[redacted]d here in MAU reporting: sore throat for the past 3 days. States her throat looks red. Also reporting bilateral ear pain for the past day. States she feels fatigued.  Onset of complaint: ongoing  Pain score: throat 10/10, ears 9/10   Vitals:   06/20/21 1121  BP: (!) 104/55  Pulse: 73  Resp: 16  Temp: 98.4 F (36.9 C)  SpO2: 99%     FHT:134  Lab orders placed from triage: none

## 2021-06-20 NOTE — MAU Provider Note (Signed)
History     CSN: [id number]  Arrival date and time: 06/20/21 1047   Event Date/Time   First Provider Initiated Contact with Patient 06/20/21 1125      Chief Complaint  Patient presents with   throat pain   Ear Pain   HPI  [name] is a 30 y.o. female [phone] @ [redacted]w[redacted]d here in MAU with bilateral ear pain, and sore throat x 3 days. She reports her children had similar symptoms in the last week and all tested negative for flu/covid. She reports chills, fatigue and a few random heart palliations. She denies chest pain. She has not tried anything over the counter for the symptoms.   OB History     Gravida  4   Para  3   Term  3   Preterm  0   AB  0   Living  3      SAB  0   IAB  0   Ectopic  0   Multiple  0   Live Births  3           Past Medical History:  Diagnosis Date   Allergy    Seasonal   COVID-19 10/2018   developed pneumonia   Dysmenorrhea    Hepatitis B    as a child   IBS (irritable bowel syndrome)    No pertinent past medical history    Pelvic congestion syndrome     Past Surgical History:  Procedure Laterality Date   COLONOSCOPY  2016   done in [place] - confirmed IBS mixed     Family History  Problem Relation Age of Onset   Stroke Maternal Grandmother    Diabetes Maternal Grandmother    Hypertension Maternal Grandmother    Heart attack Maternal Grandmother    Osteoporosis Maternal Grandmother    Hyperlipidemia Paternal Grandmother     Social History   Tobacco Use   Smoking status: Former    Types: Cigarettes    Quit date: 06/21/2008    Years since quitting: 13.0   Smokeless tobacco: Never  Vaping Use   Vaping Use: Never used  Substance Use Topics   Alcohol use: No   Drug use: No    Allergies: No Known Allergies  Medications Prior to Admission  Medication Sig Dispense Refill Last Dose   acetaminophen (TYLENOL) 325 MG tablet Take 650 mg by mouth every 6 (six) hours as needed.      cetirizine  (ZYRTEC) 10 MG tablet Take 10 mg by mouth daily.      loratadine (CLARITIN) 10 MG tablet Take 10 mg by mouth daily.      metoCLOPramide (REGLAN) 10 MG tablet Take 1 tablet (10 mg total) by mouth as needed for nausea. 30 tablet 1    Prenatal Vit-Fe Fumarate-FA (PREPLUS) 27-1 MG TABS Take 1 tablet by mouth daily. 30 tablet 13    terconazole (TERAZOL 3) 0.8 % vaginal cream Place 1 applicator vaginally at bedtime. Apply nightly for three nights. 20 g 0    Results for orders placed or performed during the hospital encounter of 06/20/21 (from the past 48 hour(s))  Resp Panel by RT-PCR (Flu A&B, Covid) Nasopharyngeal Swab     Status: None   Collection Time: 06/20/21 11:35 AM   Specimen: Nasopharyngeal Swab; Nasopharyngeal(NP) swabs in vial transport medium  Result Value Ref Range   SARS Coronavirus 2 by RT PCR NEGATIVE NEGATIVE    Comment: (NOTE) SARS-CoV-2 target nucleic acids are NOT DETECTED.  The SARS-CoV-2 RNA is generally detectable in upper respiratory specimens during the acute phase of infection. The lowest concentration of SARS-CoV-2 viral copies this assay can detect is 138 copies/mL. A negative result does not preclude SARS-Cov-2 infection and should not be used as the sole basis for treatment or other patient management decisions. A negative result may occur with  improper specimen collection/handling, submission of specimen other than nasopharyngeal swab, presence of viral mutation(s) within the areas targeted by this assay, and inadequate number of viral copies(<138 copies/mL). A negative result must be combined with clinical observations, patient history, and epidemiological information. The expected result is Negative.  Fact Sheet for Patients:  BloggerCourse.com  Fact Sheet for Healthcare Providers:  SeriousBroker.it  This test is no t yet approved or cleared by the [place] FDA and  has been authorized for detection  and/or diagnosis of SARS-CoV-2 by FDA under an Emergency Use Authorization (EUA). This EUA will remain  in effect (meaning this test can be used) for the duration of the COVID-19 declaration under Section 564(b)(1) of the Act, 21 U.S.C.section 360bbb-3(b)(1), unless the authorization is terminated  or revoked sooner.       Influenza A by PCR NEGATIVE NEGATIVE   Influenza B by PCR NEGATIVE NEGATIVE    Comment: (NOTE) The Xpert Xpress SARS-CoV-2/FLU/RSV plus assay is intended as an aid in the diagnosis of influenza from Nasopharyngeal swab specimens and should not be used as a sole basis for treatment. Nasal washings and aspirates are unacceptable for Xpert Xpress SARS-CoV-2/FLU/RSV testing.  Fact Sheet for Patients: BloggerCourse.com  Fact Sheet for Healthcare Providers: SeriousBroker.it  This test is not yet approved or cleared by the [place] FDA and has been authorized for detection and/or diagnosis of SARS-CoV-2 by FDA under an Emergency Use Authorization (EUA). This EUA will remain in effect (meaning this test can be used) for the duration of the COVID-19 declaration under Section 564(b)(1) of the Act, 21 U.S.C. section 360bbb-3(b)(1), unless the authorization is terminated or revoked.  Performed at [facility], [address], [place], [place] [address]   Group A Strep by PCR     Status: None   Collection Time: 06/20/21 11:35 AM   Specimen: Nasopharyngeal Swab; Sterile Swab  Result Value Ref Range   Group A Strep by PCR NOT DETECTED NOT DETECTED    Comment: Performed at [facility], [address], [place], [place] [address]    Review of Systems  Constitutional:  Positive for chills and fatigue. Negative for fever.  HENT:  Positive for congestion, ear pain, postnasal drip and sore throat. Negative for drooling.   Physical Exam   Blood pressure (!) 104/55, pulse 73, temperature 98.4 F (36.9 C),  temperature source Oral, resp. rate 16, last menstrual period 02/03/2021, SpO2 99 %.  Physical Exam HENT:     Right Ear: Hearing, tympanic membrane, ear canal and external ear normal.     Left Ear: Hearing, tympanic membrane, ear canal and external ear normal.     Mouth/Throat:     Mouth: No angioedema.     Palate: No mass.     Pharynx: Uvula midline. No pharyngeal swelling, oropharyngeal exudate, posterior oropharyngeal erythema or uvula swelling.     Tonsils: No tonsillar exudate or tonsillar abscesses.  Musculoskeletal:     Cervical back: Normal range of motion and neck supple. No edema.  Lymphadenopathy:     Cervical: No cervical adenopathy.   MAU Course  Procedures  MDM Called  patient with results of respiratory swabs Encouraged OTC medications   Assessment and Plan   A:  1. Sore throat   2. Otalgia of both ears   3. [redacted] weeks gestation of pregnancy      P:  Discharge home OTC medications encouraged Oral fluids encouraged [place] to use tylenol as directed on the bottle  Maize Brittingham, Victorino Dike I, NP 06/22/2021 8:22 AM

## 2021-06-20 NOTE — Discharge Instructions (Signed)

## 2021-06-21 NOTE — L&D Delivery Note (Signed)
[facility]N [facility] Delivery Note ? ?Colleen Daniels is a 31 y.o. [phone] s/p preterm vag del at [redacted]w[redacted]d. She was admitted for PPROM.  ? ?ROM: 15h 8m with clear fluid ?GBS Status: neg ?Maximum Maternal Temperature: 98.6 ? ?Labor Progress: ?Ms Colleen Daniels was admitted with PPROM at 0700 on 10/18/21. She received cytotec x 1 dose and had a cervical foley placed prior to becoming active and progressing to preterm vag delivery. ? ?Delivery Date/Time: April 30th, 2023 at 2219 ?Delivery: Called to room and patient was complete and pushing. Head delivered LOA. No nuchal cord present. Shoulder and body delivered in usual fashion. Infant with spontaneous cry, placed on mother's abdomen, dried and stimulated. Cord clamped x 2 after 1-minute delay, and cut by FOB. Cord blood drawn. Placenta delivered spontaneously with gentle cord traction. Fundus firm with massage and Pitocin. Labia, perineum, vagina, and cervix inspected and found to be intact.  ? ?Placenta: spont, intact; to L&D ?Complications: none ?Lacerations: none ?EBL: 100cc ?Analgesia: nitrous, Fentanyl ? ?Postpartum Planning ?[x]  message to sent to schedule follow-up  ? ?Infant: boy  APGARs 10/10  2770g (6lb 1.7oz) ? ?She will be placed NPO at 0700 with a ppBTL sched for 1500; her spouse will meet with the cashier tomorrow to work out the finances for a ppBTL hopefully on PPD#1. ? ?09-03-1979, CNM  ?10/18/2021 ?10:47 PM  ?

## 2021-06-22 ENCOUNTER — Encounter (HOSPITAL_COMMUNITY): Payer: Self-pay | Admitting: *Deleted

## 2021-06-22 ENCOUNTER — Other Ambulatory Visit: Payer: Self-pay

## 2021-06-22 ENCOUNTER — Ambulatory Visit (HOSPITAL_COMMUNITY)
Admission: EM | Admit: 2021-06-22 | Discharge: 2021-06-22 | Disposition: A | Discharge status: Home / Self Care | Payer: Self-pay | Attending: Family Medicine | Admitting: Family Medicine

## 2021-06-22 DIAGNOSIS — J069 Acute upper respiratory infection, unspecified: Secondary | ICD-10-CM | POA: Insufficient documentation

## 2021-06-22 LAB — RESPIRATORY PANEL BY PCR

## 2021-06-22 NOTE — ED Provider Notes (Addendum)
[facility]    CSN: [id number] Arrival date & time: 06/22/21  1253      History   Chief Complaint Chief Complaint  Patient presents with   Sore Throat   Otalgia    RT   Nasal Congestion    HPI Colleen Daniels is a 31 y.o. female.    Sore Throat  Otalgia Here with about a 5 day of congestion and sore throat. Not much cough. No f/c/n/v/d. Was tested for flu, strep, and covid at the MAU on 12/31, and all negative. She does feel that she is more congested and is feeling more pain on swallowing on her right throat. Feels swollen under her right jaw.  Is [redacted] weeks pregnant. Has been having LLQ cramping today, but states it has happened on and off before.  Is having green dc out of her right nostril, not so much her left    Past Medical History:  Diagnosis Date   Allergy    Seasonal   COVID-19 10/2018   developed pneumonia   Dysmenorrhea    Hepatitis B    as a child   IBS (irritable bowel syndrome)    No pertinent past medical history    Pelvic congestion syndrome     Patient Active Problem List   Diagnosis Date Noted   Obesity 04/29/2021   Supervision of high risk pregnancy, antepartum 04/22/2021   Frequent headaches 04/22/2021   PID (pelvic inflammatory disease) 07/30/2020   IBS (irritable bowel syndrome) 07/30/2020    Past Surgical History:  Procedure Laterality Date   COLONOSCOPY  2016   done in [place] - confirmed IBS mixed     OB History     Gravida  4   Para  3   Term  3   Preterm  0   AB  0   Living  3      SAB  0   IAB  0   Ectopic  0   Multiple  0   Live Births  3            Home Medications    Prior to Admission medications   Medication Sig Start Date End Date Taking? Authorizing Provider  acetaminophen (TYLENOL) 325 MG tablet Take 650 mg by mouth every 6 (six) hours as needed.    [provider]  cetirizine (ZYRTEC) 10 MG tablet Take 10 mg by mouth daily. 04/17/21   [provider]  loratadine (CLARITIN) 10 MG tablet Take 10 mg by mouth daily.    [provider]  metoCLOPramide (REGLAN) 10 MG tablet Take 1 tablet (10 mg total) by mouth as needed for nausea. 04/29/21   Rasch, Victorino Dike I, NP  Prenatal Vit-Fe Fumarate-FA (PREPLUS) 27-1 MG TABS Take 1 tablet by mouth daily. 03/06/21   Calvert Cantor, CNM  terconazole (TERAZOL 3) 0.8 % vaginal cream Place 1 applicator vaginally at bedtime. Apply nightly for three nights. 06/11/21   Burleson, Brand Males, NP  pantoprazole (PROTONIX) 40 MG tablet Take 1 tablet (40 mg total) by mouth daily. Patient not taking: No sig reported 01/27/16 11/30/18  Berton Bon, MD    Family History Family History  Problem Relation Age of Onset   Stroke Maternal Grandmother    Diabetes Maternal Grandmother    Hypertension Maternal Grandmother    Heart attack Maternal Grandmother    Osteoporosis Maternal Grandmother    Hyperlipidemia Paternal Grandmother     Social History Social History  Tobacco Use   Smoking status: Former    Types: Cigarettes    Quit date: 06/21/2008    Years since quitting: 13.0   Smokeless tobacco: Never  Vaping Use   Vaping Use: Never used  Substance Use Topics   Alcohol use: No   Drug use: No     Allergies   Patient has no known allergies.   Review of Systems Review of Systems  HENT:  Positive for ear pain.     Physical Exam Triage Vital Signs ED Triage Vitals  Enc Vitals Group     BP 06/22/21 1601 108/73     Pulse Rate 06/22/21 1601 64     Resp 06/22/21 1601 20     Temp 06/22/21 1601 97.9 F (36.6 C)     Temp src --      SpO2 06/22/21 1601 98 %     Weight --      Height --      Head Circumference --      Peak Flow --      Pain Score 06/22/21 1559 8     Pain Loc --      Pain Edu? --      Excl. in GC? --    No data found.  Updated Vital Signs BP 108/73    Pulse 64    Temp 97.9 F (36.6 C)    Resp 20    LMP 02/03/2021    SpO2 98%   Visual Acuity Right  Eye Distance:   Left Eye Distance:   Bilateral Distance:    Right Eye Near:   Left Eye Near:    Bilateral Near:     Physical Exam Vitals reviewed.  Constitutional:      General: She is not in acute distress.    Appearance: She is not toxic-appearing or diaphoretic.  HENT:     Right Ear: Tympanic membrane and ear canal normal.     Left Ear: Tympanic membrane and ear canal normal.     Nose: Congestion present.     Mouth/Throat:     Mouth: Mucous membranes are moist.     Pharynx: No oropharyngeal exudate or posterior oropharyngeal erythema.  Eyes:     Extraocular Movements: Extraocular movements intact.     Conjunctiva/sclera: Conjunctivae normal.     Pupils: Pupils are equal, round, and reactive to light.  Neck:     Comments: No submandibular mass/adenopathy Cardiovascular:     Rate and Rhythm: Normal rate and regular rhythm.     Heart sounds: No murmur heard. Pulmonary:     Effort: Pulmonary effort is normal.     Breath sounds: No wheezing, rhonchi or rales.  Musculoskeletal:     Cervical back: Neck supple.  Lymphadenopathy:     Cervical: No cervical adenopathy.  Skin:    Capillary Refill: Capillary refill takes less than 2 seconds.     Coloration: Skin is not jaundiced or pale.  Neurological:     Mental Status: She is alert and oriented to person, place, and time.  Psychiatric:        Behavior: Behavior normal.     UC Treatments / Results  Labs (all labs ordered are listed, but only abnormal results are displayed) Labs Reviewed  RESPIRATORY PANEL BY PCR    EKG   Radiology No results found.  Procedures Procedures (including critical care time)  Medications Ordered in UC Medications - No data to display  Initial Impression / Assessment and Plan / UC  Course  I have reviewed the triage vital signs and the nursing notes.  Pertinent labs & imaging results that were available during my care of the patient were reviewed by me and considered in my medical  decision making (see chart for details).     Since rapid strep done, will do c/s today. Will also do respiratory panel to see if other viral illness identified. Final Clinical Impressions(s) / UC Diagnoses   Final diagnoses:  Viral upper respiratory tract infection     Discharge Instructions      Throat culture was done today, and staff will call you if it is positive for strep, and we will send in antibiotics for you.  Otherwise, it is possible that this is all viral in origin, and needs over the counter meds such as mucinex to help with the symptoms. A respiratory swab was done, and will check for other viruses.  If the abd cramping continues, go to the maternal admissions unit again to be evaluated.     ED Prescriptions   None    PDMP not reviewed this encounter.   Barrett Henle, MD 06/22/21 1638    Barrett Henle, MD 06/22/21 [phone]

## 2021-06-22 NOTE — Discharge Instructions (Addendum)
Throat culture was done today, and staff will call you if it is positive for strep, and we will send in antibiotics for you.  Otherwise, it is possible that this is all viral in origin, and needs over the counter meds such as mucinex to help with the symptoms. A respiratory swab was done, and will check for other viruses.  If the abd cramping continues, go to the maternal admissions unit again to be evaluated.

## 2021-06-22 NOTE — ED Triage Notes (Signed)
Pt reports LLQ ABD cramping and she ia 19 weks pregnant . Pt also since Friday Pt has had a sore throat ,Rt ear pain and nasal congestion.

## 2021-07-08 ENCOUNTER — Ambulatory Visit (INDEPENDENT_AMBULATORY_CARE_PROVIDER_SITE_OTHER): Payer: Self-pay | Admitting: Family Medicine

## 2021-07-08 ENCOUNTER — Other Ambulatory Visit: Payer: Self-pay

## 2021-07-08 ENCOUNTER — Encounter: Payer: Self-pay | Admitting: Family Medicine

## 2021-07-08 VITALS — BP 115/74 | HR 66 | Wt 200.4 lb

## 2021-07-08 DIAGNOSIS — R0981 Nasal congestion: Secondary | ICD-10-CM

## 2021-07-08 DIAGNOSIS — O0990 Supervision of high risk pregnancy, unspecified, unspecified trimester: Secondary | ICD-10-CM

## 2021-07-08 MED ORDER — FLUTICASONE PROPIONATE 50 MCG/ACT NA SUSP: 1.0000 | Freq: Every day | NASAL | 2 refills | Status: DC

## 2021-07-08 MED ORDER — LORATADINE 10 MG PO TABS: 10.0000 mg | ORAL_TABLET | Freq: Every day | ORAL | 11 refills | Status: DC

## 2021-07-08 NOTE — Progress Notes (Signed)
° °  Subjective:  Colleen Daniels is a 31 y.o. [phone] at [redacted]w[redacted]d being seen today for ongoing prenatal care.  She is currently monitored for the following issues for this low-risk pregnancy and has PID (pelvic inflammatory disease); IBS (irritable bowel syndrome); Supervision of high risk pregnancy, antepartum; Frequent headaches; and Obesity on their problem list.  Patient reports  ongoing nasal congestion. Took covid test last month but none this month. Was a little better since last month but starting to get worse again .  Contractions: Not present. Vag. Bleeding: None.  Movement: Present. Denies leaking of fluid.   The following portions of the patient's history were reviewed and updated as appropriate: allergies, current medications, past family history, past medical history, past social history, past surgical history and problem list. Problem list updated.  Objective:   Vitals:   07/08/21 1102  BP: 115/74  Pulse: 66  Weight: 200 lb 6.4 oz (90.9 kg)    Fetal Status: Fetal Heart Rate (bpm): 141   Movement: Present     General:  Alert, oriented and cooperative. Patient is in no acute distress.  Skin: Skin is warm and dry. No rash noted.   Cardiovascular: Normal heart rate noted  Respiratory: Normal respiratory effort, no problems with respiration noted  Abdomen: Soft, gravid, appropriate for gestational age. Pain/Pressure: Present     Pelvic: Vag. Bleeding: None     Cervical exam deferred        Extremities: Normal range of motion.  Edema: Trace  Mental Status: Normal mood and affect. Normal behavior. Normal judgment and thought content.   Urinalysis:      Assessment and Plan:  Pregnancy: G4P3003 at [redacted]w[redacted]d  1. Supervision of high risk pregnancy, antepartum BP and FHR normal Anatomy scan done 06/29/2021, records not yet in our system, will request Reporting increased nasal congestion. Recommended taking flu/covid test and sent rx for claritin and flonase  Preterm labor  symptoms and general obstetric precautions including but not limited to vaginal bleeding, contractions, leaking of fluid and fetal movement were reviewed in detail with the patient. Please refer to After Visit Summary for other counseling recommendations.  Return in 4 weeks (on 08/05/2021) for ob visit, LRC, 28 wk labs.   Venora Maples, MD

## 2021-07-08 NOTE — Patient Instructions (Signed)

## 2021-07-20 ENCOUNTER — Telehealth: Payer: Self-pay

## 2021-07-20 NOTE — Telephone Encounter (Signed)
Pt call transferred from the front office. Pt states that she had an U/S on 06/29/21 at [facility] and has another one today at 1000.  Pt wanted to know why she needed the U/S today.  Called [facility] and received results of U/S showing normal anatomy scan with clinical indication for follow up.  Per provider note, no indication for another U/S.  Attempted to call the pt to let her know that I am going to cancel appt but did not want to cancel her until I spoke with her.  Left message on voicemail to call the office so that we can cancel her appt.  Pt returned call and I informed pt that her U/S has been canceled and if the provider recommends an U/S at her next provider visit we will schedule it then.  Pt verbalized understanding with no further questions.   Colleen Daniels  07/20/21

## 2021-07-23 ENCOUNTER — Encounter: Payer: Self-pay | Admitting: *Deleted

## 2021-07-28 ENCOUNTER — Telehealth: Payer: Self-pay | Admitting: Family Medicine

## 2021-07-28 NOTE — Telephone Encounter (Signed)
Patient state she is having discharge and pain, want to know if she need to come in for a visit

## 2021-07-28 NOTE — Telephone Encounter (Signed)
Called patient to follow up. Pt reports burning with urination. Also reports cottage cheese like vaginal discharge with itching. Reports seeing yellow discharge on pad in underwear. Pt does not think she is incontinent but says this looks like urine. Recommended patient come to office for UTI check and self swab. Appt scheduled for tomorrow. Offered to send Terazol cream for use tonight, pt declines.

## 2021-07-29 ENCOUNTER — Ambulatory Visit (INDEPENDENT_AMBULATORY_CARE_PROVIDER_SITE_OTHER): Payer: Self-pay

## 2021-07-29 ENCOUNTER — Other Ambulatory Visit: Payer: Self-pay

## 2021-07-29 ENCOUNTER — Telehealth: Payer: Self-pay

## 2021-07-29 ENCOUNTER — Other Ambulatory Visit (HOSPITAL_COMMUNITY)
Admission: RE | Admit: 2021-07-29 | Discharge: 2021-07-29 | Disposition: A | Discharge status: Home / Self Care | Payer: Self-pay | Source: Ambulatory Visit | Attending: Family Medicine | Admitting: Family Medicine

## 2021-07-29 VITALS — BP 109/63 | HR 71 | Wt 202.9 lb

## 2021-07-29 DIAGNOSIS — R300 Dysuria: Secondary | ICD-10-CM

## 2021-07-29 DIAGNOSIS — N898 Other specified noninflammatory disorders of vagina: Secondary | ICD-10-CM | POA: Insufficient documentation

## 2021-07-29 DIAGNOSIS — R519 Headache, unspecified: Secondary | ICD-10-CM

## 2021-07-29 DIAGNOSIS — O0990 Supervision of high risk pregnancy, unspecified, unspecified trimester: Secondary | ICD-10-CM

## 2021-07-29 LAB — POCT URINALYSIS DIP (DEVICE)
Glucose, UA: NEGATIVE mg/dL
Hgb urine dipstick: NEGATIVE
Ketones, ur: NEGATIVE mg/dL
Leukocytes,Ua: NEGATIVE
Nitrite: NEGATIVE
Protein, ur: NEGATIVE mg/dL
Specific Gravity, Urine: >=1.03 (ref 1.005–1.030)
Urobilinogen, UA: 0.2 mg/dL (ref 0.0–1.0)
pH: 5.5 (ref 5.0–8.0)

## 2021-07-29 MED ORDER — TERCONAZOLE 0.4 % VA CREA: 1.0000 | TOPICAL_CREAM | Freq: Every day | VAGINAL | 0 refills | Status: DC

## 2021-07-29 NOTE — Telephone Encounter (Signed)
Pt called stating that she received test results and had a question.  Called pt and pt informed me that she saw a red marking on her urinalysis.  I explained to the pt that those results are of concern and there could be times where she will see abnormal values however in pregnancy sometimes those values are a little different which will show abnormal but they are normal.  I explained to the pt that her self swab has not resulted yet and that we will call with abnormal results.  Pt verbalized understanding.   Leonette Nutting  07/29/21

## 2021-07-29 NOTE — Progress Notes (Signed)
Here today with complaint of burning with urination. Further discussion reveals burning is at all times, does not worsen during urination. Pt reports this feels like inner labia, vaginal area. Has experienced this prior to pregnancy and was given Nystatin and triamcinolone cream that improved symptoms. Pt reports she did not experience white clumpy discharge previously, but is experiencing this now. UA is not suspicious for UTI. Self swab instructions given and specimen obtained. Terazol vaginal cream ordered per protocol. Will contact pt with abnormal results. Pt to follow up at next routine OB appt if discomfort continues.   Fleet Contras RN 07/29/21

## 2021-07-30 ENCOUNTER — Other Ambulatory Visit: Payer: Self-pay | Admitting: Obstetrics and Gynecology

## 2021-07-30 LAB — CERVICOVAGINAL ANCILLARY ONLY
Bacterial Vaginitis (gardnerella): NEGATIVE
Candida Glabrata: NEGATIVE
Candida Vaginitis: POSITIVE — AB
Chlamydia: NEGATIVE
Comment: NEGATIVE
Comment: NEGATIVE
Comment: NEGATIVE
Comment: NEGATIVE
Comment: NEGATIVE
Comment: NORMAL
Neisseria Gonorrhea: NEGATIVE
Trichomonas: NEGATIVE

## 2021-08-05 ENCOUNTER — Ambulatory Visit (INDEPENDENT_AMBULATORY_CARE_PROVIDER_SITE_OTHER): Payer: Self-pay | Admitting: Student

## 2021-08-05 ENCOUNTER — Other Ambulatory Visit: Payer: Self-pay

## 2021-08-05 VITALS — BP 108/64 | HR 79 | Wt 203.0 lb

## 2021-08-05 DIAGNOSIS — Z23 Encounter for immunization: Secondary | ICD-10-CM

## 2021-08-05 DIAGNOSIS — O0990 Supervision of high risk pregnancy, unspecified, unspecified trimester: Secondary | ICD-10-CM

## 2021-08-05 DIAGNOSIS — Z3A26 26 weeks gestation of pregnancy: Secondary | ICD-10-CM

## 2021-08-05 NOTE — Progress Notes (Signed)
Patient report lower abdominal pain that "come and goes" she describes the pain as "tight" and rated it 8/10. Patient believes it may be contractions.

## 2021-08-05 NOTE — Progress Notes (Addendum)
° °  PRENATAL VISIT NOTE  Subjective:  Colleen Daniels is a 31 y.o. [phone] at [redacted]w[redacted]d being seen today for ongoing prenatal care.  She is currently monitored for the following issues for this low-risk pregnancy and has PID (pelvic inflammatory disease); IBS (irritable bowel syndrome); Supervision of high risk pregnancy, antepartum; Frequent headaches; and Obesity on their problem list.  Patient reports no complaints. She would like to sign BTL papers today.  Contractions: Irritability. Vag. Bleeding: None.  Movement: Present. Denies leaking of fluid.   The following portions of the patient's history were reviewed and updated as appropriate: allergies, current medications, past family history, past medical history, past social history, past surgical history and problem list.   Objective:   Vitals:   08/05/21 0851  BP: 108/64  Pulse: 79  Weight: 203 lb (92.1 kg)    Fetal Status: Fetal Heart Rate (bpm): 129 Fundal Height: 26 cm Movement: Present     General:  Alert, oriented and cooperative. Patient is in no acute distress.  Skin: Skin is warm and dry. No rash noted.   Cardiovascular: Normal heart rate noted  Respiratory: Normal respiratory effort, no problems with respiration noted  Abdomen: Soft, gravid, appropriate for gestational age.  Pain/Pressure: Present     Pelvic: Cervical exam deferred        Extremities: Normal range of motion.     Mental Status: Normal mood and affect. Normal behavior. Normal judgment and thought content.   Assessment and Plan:  Pregnancy: G4P3003 at [redacted]w[redacted]d 1. Supervision of high risk pregnancy, antepartum -2 hour GTT in process  - Tdap vaccine greater than or equal to 7yo IM -she would like to sign BTL papers today; she prefers inperson visit next visit.   Preterm labor symptoms and general obstetric precautions including but not limited to vaginal bleeding, contractions, leaking of fluid and fetal movement were reviewed in detail with the  patient. Please refer to After Visit Summary for other counseling recommendations.   No follow-ups on file.  Future Appointments  Date Time Provider Department Center  08/05/2021  9:55 AM Marylene Land, CNM [facility]    Charlesetta Garibaldi [place], [place]

## 2021-08-06 LAB — GLUCOSE TOLERANCE, 2 HOURS W/ 1HR
Glucose, 1 hour: 99 mg/dL (ref 70–179)
Glucose, 2 hour: 97 mg/dL (ref 70–152)
Glucose, Fasting: 74 mg/dL (ref 70–91)

## 2021-08-06 LAB — CBC
Hematocrit: 35.2 % (ref 34.0–46.6)
Hemoglobin: 12 g/dL (ref 11.1–15.9)
MCH: 29.1 pg (ref 26.6–33.0)
MCHC: 34.1 g/dL (ref 31.5–35.7)
MCV: 85 fL (ref 79–97)
Platelets: 290 10*3/uL (ref 150–450)
RBC: 4.13 x10E6/uL (ref 3.77–5.28)
RDW: 12.9 % (ref 11.7–15.4)
WBC: 9.7 10*3/uL (ref 3.4–10.8)

## 2021-08-06 LAB — RPR: RPR Ser Ql: NONREACTIVE

## 2021-08-06 LAB — HIV ANTIBODY (ROUTINE TESTING W REFLEX): HIV Screen 4th Generation wRfx: NONREACTIVE

## 2021-08-26 ENCOUNTER — Ambulatory Visit (INDEPENDENT_AMBULATORY_CARE_PROVIDER_SITE_OTHER): Payer: Self-pay | Admitting: Certified Nurse Midwife

## 2021-08-26 ENCOUNTER — Other Ambulatory Visit: Payer: Self-pay

## 2021-08-26 VITALS — BP 112/58 | HR 69 | Wt 207.8 lb

## 2021-08-26 DIAGNOSIS — Z3A29 29 weeks gestation of pregnancy: Secondary | ICD-10-CM

## 2021-08-26 DIAGNOSIS — Z3493 Encounter for supervision of normal pregnancy, unspecified, third trimester: Secondary | ICD-10-CM

## 2021-08-26 DIAGNOSIS — R42 Dizziness and giddiness: Secondary | ICD-10-CM

## 2021-08-26 DIAGNOSIS — O0993 Supervision of high risk pregnancy, unspecified, third trimester: Secondary | ICD-10-CM

## 2021-08-26 DIAGNOSIS — K0889 Other specified disorders of teeth and supporting structures: Secondary | ICD-10-CM

## 2021-08-26 NOTE — Progress Notes (Signed)
? ?  PRENATAL VISIT NOTE ? ?Subjective:  ?Colleen Daniels is a 31 y.o. [phone] at [redacted]w[redacted]d being seen today for ongoing prenatal care.  She is currently monitored for the following issues for this low-risk pregnancy and has PID (pelvic inflammatory disease); IBS (irritable bowel syndrome); Supervision of high risk pregnancy, antepartum; Frequent headaches; and Obesity on their problem list. ? ?Patient reports  dental pain from a cavity - she went to have it filled but found out she was pregnant shortly before the appointment and the dentist refused to treat her or prescribe antibiotics. Would really like it filled before it gets worse. Reports having dizzy spells where she gets lightheaded, hot, shaky and sees flashing lights. It will resolve when she sits down, closes her eyes and takes deep breaths then eats something. Has checked her BP when this has happened and it's always been her normal .  Contractions: Irritability. Vag. Bleeding: None.  Movement: Present. Denies leaking of fluid.  ? ?The following portions of the patient's history were reviewed and updated as appropriate: allergies, current medications, past family history, past medical history, past social history, past surgical history and problem list.  ? ?Objective:  ? ?Vitals:  ? 08/26/21 1110  ?BP: (!) 112/58  ?Pulse: 69  ?Weight: 207 lb 12.8 oz (94.3 kg)  ? ? ?Fetal Status:     Movement: Present    ? ?General:  Alert, oriented and cooperative. Patient is in no acute distress.  ?Skin: Skin is warm and dry. No rash noted.   ?Cardiovascular: Normal heart rate noted  ?Respiratory: Normal respiratory effort, no problems with respiration noted  ?Abdomen: Soft, gravid, appropriate for gestational age.  Pain/Pressure: Present     ?Pelvic: Cervical exam deferred        ?Extremities: Normal range of motion.  Edema: Trace  ?Mental Status: Normal mood and affect. Normal behavior. Normal judgment and thought content.  ? ?Assessment and Plan:  ?Pregnancy:  [id number] at [redacted]w[redacted]d ?1. Supervision of low-risk pregnancy, third trimester ?- Doing well overall, feeling regular and vigorous fetal movement  ? ?2. [redacted] weeks gestation of pregnancy ?- Routine OB care  ? ?3. Tooth pain ?- Dental letter given so she can get her cavity filled ? ?4. Dizzy spells ?- Review of A1C/GTT results shows these are more likely related to hypoglycemic events than BP or cardiac issues. Discussed ways to prevent hypoglycemia and encouraged her to increase protein intake and encouraged her to keep snacks with complex carbs on hand. ?- If dietary adjustments do not work or episodes get worse, will refer to [facility] team for evaluation. ? ?Preterm labor symptoms and general obstetric precautions including but not limited to vaginal bleeding, contractions, leaking of fluid and fetal movement were reviewed in detail with the patient. ?Please refer to After Visit Summary for other counseling recommendations.  ? ?Return in about 2 weeks (around 09/09/2021) for IN-PERSON, LOB. ? ?Future Appointments  ?Date Time Provider Department Center  ?09/09/2021 10:35 AM Bernerd Limbo, CNM [facility]  ? ?Bernerd Limbo, CNM ? ?

## 2021-08-26 NOTE — Progress Notes (Signed)
Pt states has a cavity, not sure how to go about a dentist since she is pregnant. ?

## 2021-09-09 ENCOUNTER — Ambulatory Visit (INDEPENDENT_AMBULATORY_CARE_PROVIDER_SITE_OTHER): Payer: Self-pay | Admitting: Certified Nurse Midwife

## 2021-09-09 ENCOUNTER — Other Ambulatory Visit: Payer: Self-pay

## 2021-09-09 VITALS — BP 104/63 | HR 84 | Wt 210.0 lb

## 2021-09-09 DIAGNOSIS — N949 Unspecified condition associated with female genital organs and menstrual cycle: Secondary | ICD-10-CM

## 2021-09-09 DIAGNOSIS — O0990 Supervision of high risk pregnancy, unspecified, unspecified trimester: Secondary | ICD-10-CM

## 2021-09-09 DIAGNOSIS — Z3A31 31 weeks gestation of pregnancy: Secondary | ICD-10-CM

## 2021-09-09 DIAGNOSIS — M5432 Sciatica, left side: Secondary | ICD-10-CM

## 2021-09-09 NOTE — Progress Notes (Signed)
Patient has food insecurity but declines food market today. ? ?Colleen Richards, RN ?09/09/21 ?

## 2021-09-09 NOTE — Progress Notes (Signed)
? ?  PRENATAL VISIT NOTE ? ?Subjective:  ?Colleen Daniels is a 31 y.o. [phone] at [redacted]w[redacted]d being seen today for ongoing prenatal care.  She is currently monitored for the following issues for this low-risk pregnancy and has PID (pelvic inflammatory disease); IBS (irritable bowel syndrome); Supervision of high risk pregnancy, antepartum; Frequent headaches; and Obesity on their problem list. ? ?Patient reports  having some round ligament pain and sciatica that wraps around to the front and shoots down the back of her leg. Has not tried much for it, does not like taking Tylenol and it did not work when she tried .  Contractions: Irritability. Vag. Bleeding: None.  Movement: Present. Denies leaking of fluid.  ? ?The following portions of the patient's history were reviewed and updated as appropriate: allergies, current medications, past family history, past medical history, past social history, past surgical history and problem list.  ? ?Objective:  ? ?Vitals:  ? 09/09/21 1110  ?BP: 104/63  ?Pulse: 84  ?Weight: 210 lb (95.3 kg)  ? ?Fetal Status: Fetal Heart Rate (bpm): 128 Fundal Height: 31 cm Movement: Present  Presentation: Vertex ? ?General:  Alert, oriented and cooperative. Patient is in no acute distress.  ?Skin: Skin is warm and dry. No rash noted.   ?Cardiovascular: Normal heart rate noted  ?Respiratory: Normal respiratory effort, no problems with respiration noted  ?Abdomen: Soft, gravid, appropriate for gestational age.  Pain/Pressure: Present     ?Pelvic: Cervical exam deferred        ?Extremities: Normal range of motion.  Edema: Trace  ?Mental Status: Normal mood and affect. Normal behavior. Normal judgment and thought content.  ? ?Assessment and Plan:  ?Pregnancy: [id number] at [redacted]w[redacted]d ?1. Supervision of high risk pregnancy, antepartum ?- Doing well overall, feeling regular and vigorous fetal movement  ? ?2. [redacted] weeks gestation of pregnancy ?- Routine OB care  ? ?3. Round ligament pain ?- Demonstrated round  ligament stretches/massage to relieve and prevent pains ? ?4. Sciatica of left side ?- Demonstrated stretch to relieve sciatic pain ? ?Preterm labor symptoms and general obstetric precautions including but not limited to vaginal bleeding, contractions, leaking of fluid and fetal movement were reviewed in detail with the patient. ?Please refer to After Visit Summary for other counseling recommendations.  ? ?Return in about 2 weeks (around 09/23/2021) for IN-PERSON, LOB. ? ?Future Appointments  ?Date Time Provider [place]  ?09/24/2021 10:55 AM Aletha Halim, MD [facility]  ? ? ?Gabriel Carina, CNM ? ?

## 2021-09-24 ENCOUNTER — Ambulatory Visit (INDEPENDENT_AMBULATORY_CARE_PROVIDER_SITE_OTHER): Payer: Self-pay | Admitting: Obstetrics and Gynecology

## 2021-09-24 VITALS — BP 118/77 | HR 72 | Wt 211.9 lb

## 2021-09-24 DIAGNOSIS — Z3A33 33 weeks gestation of pregnancy: Secondary | ICD-10-CM

## 2021-09-24 DIAGNOSIS — O99210 Obesity complicating pregnancy, unspecified trimester: Secondary | ICD-10-CM

## 2021-09-24 DIAGNOSIS — Z6838 Body mass index (BMI) 38.0-38.9, adult: Secondary | ICD-10-CM

## 2021-09-24 NOTE — Patient Instructions (Signed)
Pressure or compression stockings can help with the swelling ?

## 2021-09-25 DIAGNOSIS — Z6838 Body mass index (BMI) 38.0-38.9, adult: Secondary | ICD-10-CM | POA: Insufficient documentation

## 2021-09-25 NOTE — Progress Notes (Addendum)
? ?  PRENATAL VISIT NOTE ? ?Subjective:  ?Colleen Daniels is a 31 y.o. [phone] at [redacted]w[redacted]d being seen today for ongoing prenatal care.  She is currently monitored for the following issues for this low-risk pregnancy and has PID (pelvic inflammatory disease); IBS (irritable bowel syndrome); Supervision of high risk pregnancy, antepartum; Frequent headaches; Obesity; and BMI 38.0-38.9,adult on their problem list. ? ?Patient reports no complaints.  Contractions: Irritability. Vag. Bleeding: None.  Movement: Present. Denies leaking of fluid.  ? ?The following portions of the patient's history were reviewed and updated as appropriate: allergies, current medications, past family history, past medical history, past social history, past surgical history and problem list.  ? ?Objective:  ? ?Vitals:  ? 09/24/21 1109  ?BP: 118/77  ?Pulse: 72  ?Weight: 211 lb 14.4 oz (96.1 kg)  ? ? ?Fetal Status: Fetal Heart Rate (bpm): 126   Movement: Present    ? ?General:  Alert, oriented and cooperative. Patient is in no acute distress.  ?Skin: Skin is warm and dry. No rash noted.   ?Cardiovascular: Normal heart rate noted  ?Respiratory: Normal respiratory effort, no problems with respiration noted  ?Abdomen: Soft, gravid, appropriate for gestational age.  Pain/Pressure: Present     ?Pelvic: Cervical exam deferred        ?Extremities: Normal range of motion.  Edema: Trace  ?Mental Status: Normal mood and affect. Normal behavior. Normal judgment and thought content.  ? ?Assessment and Plan:  ?Pregnancy: [id number] at [redacted]w[redacted]d ?1. [redacted] weeks gestation of pregnancy ?28wk labs wnl. January [place] anatomy u/s negative. BTL if for c-section. D/w her re: how to do self pay ppBTL ?Patient interested in waterbirth. I showed her how to sign up for class online and need to do this and see cnm.  ? ?2. Obesity in pregnancy ?Weight stable ? ?3. BMI 38.0-38.9,adult ? ?Preterm labor symptoms and general obstetric precautions including but not limited to  vaginal bleeding, contractions, leaking of fluid and fetal movement were reviewed in detail with the patient. ?Please refer to After Visit Summary for other counseling recommendations.  ? ?Return in about 2 weeks (around 10/08/2021) for in person, low risk ob, with midwife. ? ?Future Appointments  ?Date Time Provider Department Center  ?10/12/2021  1:15 PM Bernerd Limbo, CNM [facility]  ? ? ?Glasford Bing, MD  ?

## 2021-10-12 ENCOUNTER — Ambulatory Visit (INDEPENDENT_AMBULATORY_CARE_PROVIDER_SITE_OTHER): Payer: Self-pay | Admitting: Certified Nurse Midwife

## 2021-10-12 ENCOUNTER — Other Ambulatory Visit (HOSPITAL_COMMUNITY)
Admission: RE | Admit: 2021-10-12 | Discharge: 2021-10-12 | Disposition: A | Discharge status: Home / Self Care | Payer: Self-pay | Source: Ambulatory Visit | Attending: Certified Nurse Midwife | Admitting: Certified Nurse Midwife

## 2021-10-12 ENCOUNTER — Ambulatory Visit (INDEPENDENT_AMBULATORY_CARE_PROVIDER_SITE_OTHER): Payer: Self-pay

## 2021-10-12 VITALS — BP 127/67 | HR 64 | Wt 213.0 lb

## 2021-10-12 DIAGNOSIS — Z3493 Encounter for supervision of normal pregnancy, unspecified, third trimester: Secondary | ICD-10-CM

## 2021-10-12 DIAGNOSIS — Z3A35 35 weeks gestation of pregnancy: Secondary | ICD-10-CM

## 2021-10-12 DIAGNOSIS — O368390 Maternal care for abnormalities of the fetal heart rate or rhythm, unspecified trimester, not applicable or unspecified: Secondary | ICD-10-CM

## 2021-10-12 NOTE — Progress Notes (Signed)
? ?  PRENATAL VISIT NOTE ? ?Subjective:  ?Abisola Carrero is a 31 y.o. [phone] at [redacted]w[redacted]d being seen today for ongoing prenatal care.  She is currently monitored for the following issues for this low-risk pregnancy and has PID (pelvic inflammatory disease); IBS (irritable bowel syndrome); Supervision of high risk pregnancy, antepartum; Frequent headaches; Obesity; and BMI 38.0-38.9,adult on their problem list. ? ?Patient reports occasional contractions.  Contractions: Irritability. Vag. Bleeding: None.  Movement: Present. Denies leaking of fluid.  ? ?The following portions of the patient's history were reviewed and updated as appropriate: allergies, current medications, past family history, past medical history, past social history, past surgical history and problem list.  ? ?Objective:  ? ?Vitals:  ? 10/12/21 1337  ?BP: 127/67  ?Pulse: 64  ?Weight: 213 lb (96.6 kg)  ? ?Fetal Status: Fetal Heart Rate (bpm): 127 Fundal Height: 35 cm Movement: Present  Presentation: Vertex ? ?General:  Alert, oriented and cooperative. Patient is in no acute distress.  ?Skin: Skin is warm and dry. No rash noted.   ?Cardiovascular: Normal heart rate noted  ?Respiratory: Normal respiratory effort, no problems with respiration noted  ?Abdomen: Soft, gravid, appropriate for gestational age.  Pain/Pressure: Present     ?Pelvic: Cervical exam deferred        ?Extremities: Normal range of motion.  Edema: Moderate pitting, indentation subsides rapidly  ?Mental Status: Normal mood and affect. Normal behavior. Normal judgment and thought content.  ? ?Assessment and Plan:  ?Pregnancy: [id number] at [redacted]w[redacted]d ?1. Supervision of low-risk pregnancy, third trimester ?- Doing well overall, feeling regular and vigorous fetal movement  ?- Culture, beta strep (group b only) ?- Cervicovaginal ancillary only( [place]) ? ?2. [redacted] weeks gestation of pregnancy ?- Routine OB care  ? ?3. Abnormal fetal heart rate or rhythm affecting management of mother ?- FHR  noted at 145 with decel into 110 during contraction - this happened twice. Sent for NST/BPP. ?- NST reactive with baseline 110, no decels and BPP 10/10. ?- US FETAL BPP W/NONSTRESS; Future ? ?Preterm labor symptoms and general obstetric precautions including but not limited to vaginal bleeding, contractions, leaking of fluid and fetal movement were reviewed in detail with the patient. ?Please refer to After Visit Summary for other counseling recommendations.  ? ?Return in about 1 week (around 10/19/2021) for LOB. ? ?Future Appointments  ?Date Time Provider Department Center  ?10/19/2021 10:35 AM Hermina Staggers, MD [facility]  ? ? ?Bernerd Limbo, CNM ?

## 2021-10-13 LAB — CERVICOVAGINAL ANCILLARY ONLY
Bacterial Vaginitis (gardnerella): NEGATIVE
Candida Glabrata: NEGATIVE
Candida Vaginitis: NEGATIVE
Chlamydia: NEGATIVE
Comment: NEGATIVE
Comment: NEGATIVE
Comment: NEGATIVE
Comment: NEGATIVE
Comment: NEGATIVE
Comment: NORMAL
Neisseria Gonorrhea: NEGATIVE
Trichomonas: NEGATIVE

## 2021-10-14 ENCOUNTER — Telehealth: Payer: Self-pay | Admitting: General Practice

## 2021-10-14 DIAGNOSIS — O26893 Other specified pregnancy related conditions, third trimester: Secondary | ICD-10-CM

## 2021-10-14 MED ORDER — MAGNESIUM OXIDE -MG SUPPLEMENT 200 MG PO TABS: ORAL_TABLET | ORAL | 1 refills | Status: DC

## 2021-10-14 MED ORDER — FAMOTIDINE 20 MG PO TABS: 20.0000 mg | ORAL_TABLET | Freq: Two times a day (BID) | ORAL | 1 refills | Status: DC

## 2021-10-14 NOTE — Telephone Encounter (Signed)
Called patient and informed her of Rx sent to pharmacy. Provided information about how to pick up a magnesium supplement OTC as it may not be covered at the pharmacy since it is a supplement. Patient verbalized understanding. ?

## 2021-10-15 ENCOUNTER — Telehealth: Payer: Self-pay

## 2021-10-15 DIAGNOSIS — B951 Streptococcus, group B, as the cause of diseases classified elsewhere: Secondary | ICD-10-CM | POA: Insufficient documentation

## 2021-10-15 LAB — CULTURE, BETA STREP (GROUP B ONLY): Strep Gp B Culture: POSITIVE — AB

## 2021-10-15 NOTE — Telephone Encounter (Addendum)
Patient called front office to request results. Called pt. Patient would like to review positive GBS result and recommendation. Pt inquires about self pay BTL. Reviewed potential self pay cost of $11,000 - 12,000. Explained I am unsure when payment needs to be collected. Pt would like to know if this can occur during hospital admission for delivery. Told patient I will inquire and call her back with more information. ? ?Per Rolan Bucco patient cannot have BTL while in hospital for delivery. Will have to be scheduled for interval BTL following discharge. Approx. $5,000 due prior to surgery. Called pt; VM left stating I have update and please return call.  ?

## 2021-10-18 ENCOUNTER — Other Ambulatory Visit: Payer: Self-pay

## 2021-10-18 ENCOUNTER — Inpatient Hospital Stay (HOSPITAL_COMMUNITY)
Admission: AD | Admit: 2021-10-18 | Discharge: 2021-10-20 | DRG: 807 | Disposition: A | Discharge status: Home / Self Care | Payer: Medicaid Other | Attending: Obstetrics & Gynecology | Admitting: Obstetrics & Gynecology

## 2021-10-18 ENCOUNTER — Encounter (HOSPITAL_COMMUNITY): Payer: Self-pay | Admitting: Obstetrics & Gynecology

## 2021-10-18 DIAGNOSIS — O99214 Obesity complicating childbirth: Secondary | ICD-10-CM | POA: Diagnosis present

## 2021-10-18 DIAGNOSIS — Z3A36 36 weeks gestation of pregnancy: Secondary | ICD-10-CM

## 2021-10-18 DIAGNOSIS — B951 Streptococcus, group B, as the cause of diseases classified elsewhere: Secondary | ICD-10-CM | POA: Diagnosis present

## 2021-10-18 DIAGNOSIS — O99893 Other specified diseases and conditions complicating puerperium: Secondary | ICD-10-CM | POA: Diagnosis not present

## 2021-10-18 DIAGNOSIS — O42013 Preterm premature rupture of membranes, onset of labor within 24 hours of rupture, third trimester: Secondary | ICD-10-CM | POA: Diagnosis not present

## 2021-10-18 DIAGNOSIS — M79651 Pain in right thigh: Secondary | ICD-10-CM | POA: Diagnosis not present

## 2021-10-18 DIAGNOSIS — O42919 Preterm premature rupture of membranes, unspecified as to length of time between rupture and onset of labor, unspecified trimester: Secondary | ICD-10-CM | POA: Diagnosis present

## 2021-10-18 DIAGNOSIS — O99820 Streptococcus B carrier state complicating pregnancy: Secondary | ICD-10-CM | POA: Diagnosis not present

## 2021-10-18 DIAGNOSIS — O99824 Streptococcus B carrier state complicating childbirth: Secondary | ICD-10-CM | POA: Diagnosis present

## 2021-10-18 DIAGNOSIS — Z87891 Personal history of nicotine dependence: Secondary | ICD-10-CM

## 2021-10-18 DIAGNOSIS — E669 Obesity, unspecified: Secondary | ICD-10-CM | POA: Diagnosis present

## 2021-10-18 DIAGNOSIS — O42913 Preterm premature rupture of membranes, unspecified as to length of time between rupture and onset of labor, third trimester: Secondary | ICD-10-CM | POA: Diagnosis present

## 2021-10-18 DIAGNOSIS — O0990 Supervision of high risk pregnancy, unspecified, unspecified trimester: Secondary | ICD-10-CM

## 2021-10-18 HISTORY — DX: Acute parametritis and pelvic cellulitis: N73.0

## 2021-10-18 HISTORY — DX: Headache, unspecified: R51.9

## 2021-10-18 LAB — TYPE AND SCREEN
ABO/RH(D): A POS
Antibody Screen: NEGATIVE

## 2021-10-18 LAB — CBC
HCT: 34.9 % — ABNORMAL LOW (ref 36.0–46.0)
Hemoglobin: 11.5 g/dL — ABNORMAL LOW (ref 12.0–15.0)
MCH: 28.3 pg (ref 26.0–34.0)
MCHC: 33 g/dL (ref 30.0–36.0)
MCV: 86 fL (ref 80.0–100.0)
Platelets: 272 10*3/uL (ref 150–400)
RBC: 4.06 MIL/uL (ref 3.87–5.11)
RDW: 14.5 % (ref 11.5–15.5)
WBC: 10.7 10*3/uL — ABNORMAL HIGH (ref 4.0–10.5)
nRBC: 0 % (ref 0.0–0.2)

## 2021-10-18 LAB — POCT FERN TEST: POCT Fern Test: POSITIVE

## 2021-10-18 MED ORDER — LIDOCAINE HCL (PF) 1 % IJ SOLN: 30.0000 mL | INTRAMUSCULAR | Status: DC | PRN

## 2021-10-18 MED ORDER — ONDANSETRON HCL 4 MG/2ML IJ SOLN: 4.0000 mg | Freq: Four times a day (QID) | INTRAMUSCULAR | Status: DC | PRN

## 2021-10-18 MED ORDER — FENTANYL CITRATE (PF) 100 MCG/2ML IJ SOLN
INTRAMUSCULAR | Status: AC
  Filled 2021-10-18: qty 2

## 2021-10-18 MED ORDER — ACETAMINOPHEN 325 MG PO TABS
650.0000 mg | ORAL_TABLET | ORAL | Status: DC | PRN
  Administered 2021-10-18: 650 mg via ORAL
  Filled 2021-10-18: qty 2

## 2021-10-18 MED ORDER — SOD CITRATE-CITRIC ACID 500-334 MG/5ML PO SOLN: 30.0000 mL | ORAL | Status: DC | PRN

## 2021-10-18 MED ORDER — TERBUTALINE SULFATE 1 MG/ML IJ SOLN: 0.2500 mg | Freq: Once | INTRAMUSCULAR | Status: DC | PRN

## 2021-10-18 MED ORDER — OXYTOCIN BOLUS FROM INFUSION: 333.0000 mL | Freq: Once | INTRAVENOUS | Status: DC

## 2021-10-18 MED ORDER — MISOPROSTOL 25 MCG QUARTER TABLET: 25.0000 ug | ORAL_TABLET | ORAL | Status: DC | PRN

## 2021-10-18 MED ORDER — LACTATED RINGERS IV SOLN: 500.0000 mL | INTRAVENOUS | Status: DC | PRN

## 2021-10-18 MED ORDER — FENTANYL CITRATE (PF) 100 MCG/2ML IJ SOLN
100.0000 ug | INTRAMUSCULAR | Status: DC | PRN
  Administered 2021-10-18: 100 ug via INTRAVENOUS

## 2021-10-18 MED ORDER — SODIUM CHLORIDE 0.9 % IV SOLN
5.0000 10*6.[IU] | Freq: Once | INTRAVENOUS | Status: AC
  Administered 2021-10-18: 5 10*6.[IU] via INTRAVENOUS
  Filled 2021-10-18: qty 5

## 2021-10-18 MED ORDER — MISOPROSTOL 50MCG HALF TABLET
50.0000 ug | ORAL_TABLET | ORAL | Status: DC | PRN
  Administered 2021-10-18: 50 ug via ORAL
  Filled 2021-10-18: qty 1

## 2021-10-18 MED ORDER — PENICILLIN G POT IN DEXTROSE 60000 UNIT/ML IV SOLN
3.0000 10*6.[IU] | INTRAVENOUS | Status: DC
  Administered 2021-10-18 (×2): 3 10*6.[IU] via INTRAVENOUS
  Filled 2021-10-18 (×2): qty 50

## 2021-10-18 MED ORDER — OXYCODONE-ACETAMINOPHEN 5-325 MG PO TABS: 1.0000 | ORAL_TABLET | ORAL | Status: DC | PRN

## 2021-10-18 MED ORDER — LACTATED RINGERS IV SOLN
500.0000 mL | INTRAVENOUS | Status: DC | PRN
  Administered 2021-10-18 (×2): 1000 mL via INTRAVENOUS

## 2021-10-18 MED ORDER — LACTATED RINGERS IV SOLN: INTRAVENOUS | Status: DC

## 2021-10-18 MED ORDER — OXYCODONE-ACETAMINOPHEN 5-325 MG PO TABS: 2.0000 | ORAL_TABLET | ORAL | Status: DC | PRN

## 2021-10-18 MED ORDER — OXYTOCIN BOLUS FROM INFUSION
333.0000 mL | Freq: Once | INTRAVENOUS | Status: AC
  Administered 2021-10-18: 333 mL via INTRAVENOUS

## 2021-10-18 MED ORDER — OXYTOCIN-SODIUM CHLORIDE 30-0.9 UT/500ML-% IV SOLN: 2.5000 [IU]/h | INTRAVENOUS | Status: DC

## 2021-10-18 MED ORDER — OXYTOCIN-SODIUM CHLORIDE 30-0.9 UT/500ML-% IV SOLN
2.5000 [IU]/h | INTRAVENOUS | Status: DC
  Filled 2021-10-18: qty 500

## 2021-10-18 MED ORDER — ACETAMINOPHEN 325 MG PO TABS: 650.0000 mg | ORAL_TABLET | ORAL | Status: DC | PRN

## 2021-10-18 NOTE — Progress Notes (Signed)
Patient ID: Colleen Daniels, female   DOB: [birth year], 31 y.o.   MRN: [id number] ? ?S/p cytotec x 1 dose, using nitrous and feeling ctx as stronger, but hasn't had much cx change; open to cervical foley placement ? ?T 97.5, BPs 95/45, P 67 ?FHR 120s, +accels, no decels ?Ctx q 2-4 mins ?Cx 2/70/vtx -2 ? ?IUP@36 .5wks ?PPROM x 14h ?Latent labor ? ?-Cervical foley placed without difficulty and filled w 40cc fluid ?-Will reassess when foley dislodges to determine if more augmentation is needed or not ?-Anticipate vag del ? ?Arabella Merles CNM ?10/18/2021 ?9:13 PM ? ? ? ?

## 2021-10-18 NOTE — MAU Note (Signed)
Colleen Daniels is a 31 y.o. at [redacted]w[redacted]d here in MAU reporting: water broke at 0700, clear fluid- still coming. Has  "a big ol pad on, already soaked one'.  No bleeding. Is contracting- has not timed them . +FM.  Has been very dizzy and feeling weakness in her legs. ? ?Onset of complaint: 0700 ?Pain score: 6 ?Vitals:  ? 10/18/21 1053  ?BP: 107/66  ?Pulse: 81  ?Resp: 17  ?Temp: 98.6 ?F (37 ?C)  ?SpO2: 98%  ?   ?KMQ:286 ?Lab orders placed from triage:  none ?

## 2021-10-18 NOTE — Lactation Note (Addendum)
This note was copied from a baby's chart. ?Lactation Consultation Note ?LC noticed grimace on mom's face. LC asked mom if she was OK, mom stated she is cramping. ?Mom speaks good [place] and didn't need interpreter. ?Mom stated she BF her now 32 yr old, 40 yr old and 32 yr old for 6 months each. ?Mom's feeding choice is Breast/formula feeding. ?Placed baby in football position in order to elevate baby's head more. Baby latched 2 times and suckled once. Has no interest in BF at this time. ?Encouraged mom to keep baby at the breast STS. Mom has good breast anatomy and can latch baby. ?Will f/u w/mom on MBU. ?Since baby didn't latch, LC reported to RN. ? ? ?Patient Name: Colleen Daniels ?Today's Date: 10/18/2021 ?Reason for consult: L&D Initial assessment ?Age:84 hours ? ?Maternal Data ?Does the patient have breastfeeding experience prior to this delivery?: Yes ?How long did the patient breastfeed?: 6 months to each of her other 3 children ? ?Feeding ?  ? ?LATCH Score ?Latch: Too sleepy or reluctant, no latch achieved, no sucking elicited. ? ?Audible Swallowing: None ? ?Type of Nipple: Everted at rest and after stimulation ? ?Comfort (Breast/Nipple): Soft / non-tender ? ?Hold (Positioning): Assistance needed to correctly position infant at breast and maintain latch. ? ?LATCH Score: 5 ? ? ?Lactation Tools Discussed/Used ?  ? ?Interventions ?Interventions: Adjust position;Assisted with latch;Support pillows;Skin to skin;Position options ? ?Discharge ?  ? ?Consult Status ?Consult Status: Follow-up from L&D ?Date: 10/19/21 ?Follow-up type: In-patient ? ? ? ?[name] ?10/18/2021, 11:08 PM ? ? ? ?

## 2021-10-18 NOTE — H&P (Addendum)
OBSTETRIC ADMISSION HISTORY AND PHYSICAL ? ?Colleen Daniels is a 31 y.o. female [phone] with IUP at [redacted]w[redacted]d by LMP presenting for PPROM. She reports +FMs, No LOF, no VB, no blurry vision, headaches or peripheral edema, and RUQ pain.  She plans on breast and bottle feeding. She requests BTL for birth control. ? ?[facility] prenatal care at  [facility]   ? ?Dating: By LMP --->  Estimated Date of Delivery: 11/10/21 ? ?Sono:   ? ?Last ultrasound on 4/24 showed baby has cephalic presentation ? ? ?Prenatal History/Complications:  ?-Hx of PID ?-term SVB x 3 ? ? ? ?Past Medical History: ?Past Medical History:  ?Diagnosis Date  ? Allergy   ? Seasonal  ? COVID-19 10/2018  ? developed pneumonia  ? Dysmenorrhea   ? Headache   ? Hepatitis B   ? as a child  ? IBS (irritable bowel syndrome)   ? No pertinent past medical history   ? Pelvic congestion syndrome   ? PID (acute pelvic inflammatory disease)   ? ? ?Past Surgical History: ?Past Surgical History:  ?Procedure Laterality Date  ? COLONOSCOPY  2016  ? done in [place] - confirmed IBS mixed   ? ? ?Obstetrical History: ?OB History   ? ? Gravida  ?4  ? Para  ?3  ? Term  ?3  ? Preterm  ?0  ? AB  ?0  ? Living  ?3  ?  ? ? SAB  ?0  ? IAB  ?0  ? Ectopic  ?0  ? Multiple  ?0  ? Live Births  ?3  ?   ?  ?  ? ? ?Social History ?Social History  ? ?Socioeconomic History  ? Marital status: Married  ?  Spouse name: Not on file  ? Number of children: 3  ? Years of education: Not on file  ? Highest education level: Not on file  ?Occupational History  ? Occupation: housewife  ?Tobacco Use  ? Smoking status: Former  ?  Types: Cigarettes  ?  Quit date: 06/21/2008  ?  Years since quitting: 13.3  ? Smokeless tobacco: Never  ?Vaping Use  ? Vaping Use: Never used  ?Substance and Sexual Activity  ? Alcohol use: No  ? Drug use: No  ? Sexual activity: Yes  ?  Partners: Male  ?  Birth control/protection: None  ?  Comment: condoms occ  ?Other Topics Concern  ? Not on file  ?Social  History Narrative  ? Not on file  ? ?Social Determinants of Health  ? ?Financial Resource Strain: Not on file  ?Food Insecurity: Food Insecurity Present  ? Worried About Programme researcher, broadcasting/film/video in the Last Year: Sometimes true  ? Ran Out of Food in the Last Year: Sometimes true  ?Transportation Needs: No Transportation Needs  ? Lack of Transportation (Medical): No  ? Lack of Transportation (Non-Medical): No  ?Physical Activity: Not on file  ?Stress: Not on file  ?Social Connections: Not on file  ? ? ?Family History: ?Family History  ?Problem Relation Age of Onset  ? Hypertension Mother   ? Diabetes Mother   ? Thyroid disease Mother   ? Stroke Maternal Grandmother   ? Diabetes Maternal Grandmother   ? Hypertension Maternal Grandmother   ? Heart attack Maternal Grandmother   ? Osteoporosis Maternal Grandmother   ? Hyperlipidemia Paternal Grandmother   ? ? ?Allergies: ?No Known Allergies ? ?Pt denies allergies to latex, iodine, or shellfish. ? ?Medications Prior  to Admission  ?Medication Sig Dispense Refill Last Dose  ? famotidine (PEPCID) 20 MG tablet Take 1 tablet (20 mg total) by mouth 2 (two) times daily. 60 tablet 1 10/17/2021  ? fluticasone (FLONASE) 50 MCG/ACT nasal spray Place 1 spray into both nostrils daily. 18.2 mL 2 Past Month  ? loratadine (CLARITIN) 10 MG tablet Take 1 tablet (10 mg total) by mouth daily. 30 tablet 11 10/17/2021  ? Prenatal Vit-Fe Fumarate-FA (PREPLUS) 27-1 MG TABS Take 1 tablet by mouth daily. 30 tablet 13 Past Month  ? acetaminophen (TYLENOL) 325 MG tablet Take 650 mg by mouth every 6 (six) hours as needed.     ? Magnesium Oxide 200 MG TABS Can take 200mg -400mg  at bedtime as needed 30 tablet 1   ? metoCLOPramide (REGLAN) 10 MG tablet Take 1 tablet (10 mg total) by mouth as needed for nausea. 30 tablet 1   ? ? ? ?Review of Systems  ? ?All systems reviewed and negative except as stated in HPI ? ?Blood pressure 107/66, pulse 81, temperature 98.6 ?F (37 ?C), temperature source Oral, resp. rate  17, height 5\' 3"  (1.6 m), weight 96.8 kg, last menstrual period 02/03/2021, SpO2 98 %. ?General appearance: alert, cooperative, and appears stated age ?Lungs: clear to auscultation bilaterally ?Heart: regular rate and rhythm ?Abdomen: soft, non-tender; bowel sounds normal ?Extremities: Homans sign is negative, no sign of DVT ?Presentation: cephalic ?Fetal monitoringBaseline: 120 bpm, Variability: Good {> 6 bpm), and Accelerations: Reactive ?Uterine activityNone ?  ? ? ?Prenatal labs: ?ABO, Rh: A/Positive/-- (11/09 1457) ?Antibody: Negative (11/09 1457) ?Rubella: 1.44 (11/09 1457) ?RPR: Non Reactive (02/15 0845)  ?HBsAg: Negative (11/09 1457)  ?HIV: Non Reactive (02/15 0845)  ?GBS: Positive/-- (04/24 1611)  ?1 hr Glucola: Normal ?Genetic screening:  Declined  ?Anatomy US: Normal ? ?Prenatal Transfer Tool  ?Maternal Diabetes: No ?Genetic Screening: Declined ?Maternal Ultrasounds/Referrals: Normal ?Fetal Ultrasounds or other Referrals:  None ?Maternal Substance Abuse:  No ?Significant Maternal Medications:  None ?Significant Maternal Lab Results: Group B Strep positive ? ?Results for orders placed or performed during the hospital encounter of 10/18/21 (from the past 24 hour(s))  ?Fern Test  ? Collection Time: 10/18/21 11:01 AM  ?Result Value Ref Range  ? POCT Fern Test Positive = ruptured amniotic membanes   ? ? ?Patient Active Problem List  ? Diagnosis Date Noted  ? Group beta Strep positive 10/15/2021  ? BMI 38.0-38.9,adult 09/25/2021  ? Obesity 04/29/2021  ? Supervision of high risk pregnancy, antepartum 04/22/2021  ? Frequent headaches 04/22/2021  ? PID (pelvic inflammatory disease) 07/30/2020  ? IBS (irritable bowel syndrome) 07/30/2020  ? ? ?Assessment/Plan:  ?Colleen Daniels is a 31 y.o. [phone] at 426w5d here for PPROM ? ?#Labor: Continue expectant management.  Patient expressed desire for natural labor process without augmentation. ?#Pain: IV pain med ?#FWB: Cat 1 ?#ID:  GBS positive on PCN ?#MOF:  Bottle and breast feeding ?#MOC: Interested in BTL, discussed with patient because she is without insurance this will be a self pay procedure and she expressed desire to talk with the cashier. ?#Circ:  Yes, need consent  ? ?Jerre SimonJohn Norbert, MD  ?Center for [facility] ?10/18/2021, 11:43 AM ? ?CNM attestation: ? ?I have seen and examined this patient; I agree with above documentation in the resident's note.  ? ?Colleen Daniels is a 31 y.o. [phone] here for PPROM at 0700 with minimal ctx ? ?PE: ?BP 107/64   Pulse 66   Temp 98.4 ?F (  36.9 ?C) (Oral)   Resp 18   Ht 5\' 3"  (1.6 m)   Wt 96.8 kg   LMP 02/03/2021   SpO2 96%   BMI 37.82 kg/m?  ?Gen: calm comfortable, NAD ?Resp: normal effort, no distress ?Abd: gravid ? ?ROS, labs, PMH reviewed ? ?Plan: ?-Admit to Labor and Delivery ?-Discussed expectant management vs nipple stim vs cytotec/cervical foley; pt and spouse will keep these in mind ?-PCN for GBS ppx ?-Anticipate vag del ?-ppBTL plan pending discussion w cashier tomorrow ? ?02/05/2021 CNM ?10/18/2021, 3:18 PM   ?

## 2021-10-18 NOTE — Progress Notes (Addendum)
Labor Progress Note ?Colleen Daniels is a 31 y.o. [phone] at [redacted]w[redacted]d presented for expectant delivery after PPROM ?S: Patient is resting in bed and endorses increased frequency in contractions since getting Cytotec. She rate her pain 10/10 and expressed desire to hold off on PRN pain meds now.  ? ?O:  ?BP (!) 99/51   Pulse 70   Temp 97.9 ?F (36.6 ?C) (Oral)   Resp 18   Ht 5\' 3"  (1.6 m)   Wt 96.8 kg   LMP 02/03/2021   SpO2 96%   BMI 37.82 kg/m?  ?EFM: 120 bpm/ moderate variability/ +accels ? ?CVE: Dilation: 1 ?Effacement (%): Thick ?Cervical Position: Posterior ?Station: -3 ?Presentation: Vertex ?Exam by:: Derrill Memo ? ? ?A&P: 31 y.o. [id number] [redacted]w[redacted]d  ?#Labor: Progressing well. Pt experiencing more contraction since getting first dose of Cytotec. Plan to give a second dose of Cytotec and space out cervical checks given ROM.   ?#Pain: IV PRN  ?#FWB: Cat 1  ?#GBS positive > PCN ? ?Alen Bleacher, MD ?Center for [place], [place] ?6:39 PM ? ?Addendum: ?When nurse went in to administer the 2nd cytotec dose, patient had had a recent increase in discomfort and so the cytotec was held. ? ?Myrtis Ser CNM ?10/18/2021 ?7:57 PM ? ?

## 2021-10-18 NOTE — Discharge Summary (Signed)
? ?  Postpartum Discharge Summary ? ?   ?Patient Name: Colleen Daniels ?DOB: 08/06/1990 ?MRN: [id number] ? ?Date of admission: 10/18/2021 ?Delivery date:10/18/2021  ?Delivering provider: Serita Grammes D  ?Date of discharge: 10/20/2021 ? ?Admitting diagnosis: Normal labor [O80, Z37.9] ?Pregnancy [Z34.90] ?Intrauterine pregnancy: [redacted]w[redacted]d     ?Secondary diagnosis:  Principal Problem: ?  Vaginal delivery ?Active Problems: ?  Supervision of high risk pregnancy, antepartum ?  Obesity ?  Group beta Strep positive ?  Preterm premature rupture of membranes (PPROM) with unknown onset of labor ? ?Additional problems: None    ?Discharge diagnosis: Preterm Pregnancy Delivered                                              ?Post partum procedures: None  ?Augmentation: Cytotec and IP Foley ?Complications: None ? ?Hospital course:  Induction of Labor With Vaginal Delivery for PPROM     ?31 y.o. yo G4P3003 at [redacted]w[redacted]d was admitted for PPROM at 0700 on 10/18/2021. Patient had an uncomplicated labor course and vaginal delivery.  ?Membrane Rupture Time/Date: 7:00 AM ,10/18/2021   ?Delivery Method:Vaginal, Spontaneous  ?Episiotomy: None  ?Lacerations:  None  ?Patient had a routine postpartum course.  She developed RLE pain on PPD#1 in her upper thigh.  A DVT study was obtained and negative.  Her pain improved after Flexeril; overall most consistent with muscular spasm.  She is ambulating, tolerating a regular diet, passing flatus, and urinating well.  Patient is discharged home in stable condition on 10/20/21. ? ?Newborn Data: ?Birth date:10/18/2021  ?Birth time:10:19 PM  ?Gender:Female  ?Living status:Living  ?Apgars:10 ,10  ?Weight:2770 g (6lb 1.7oz) ? ?Magnesium Sulfate received: No ?BMZ received: No ?Rhophylac:N/A ?MMR: N/A ?T-DaP: Given prenatally ?Flu: No ?Transfusion: No ? ?Physical exam  ?Vitals:  ? 10/19/21 0552 10/19/21 1533 10/19/21 2056 10/20/21 0532  ?BP: (!) 102/56 107/62 (!) 99/58 (!) 99/55  ?Pulse: 60 66 72 63  ?Resp: $Remov'17 20 20  20  'yZwjUD$ ?Temp: 98.4 ?F (36.9 ?C) 98.3 ?F (36.8 ?C) 98.3 ?F (36.8 ?C) 98.2 ?F (36.8 ?C)  ?TempSrc: Oral Oral Oral Oral  ?SpO2: 99% 99% 98% 96%  ?Weight:      ?Height:      ? ?General: alert, cooperative, and no distress ?Lochia: appropriate ?Uterine Fundus: firm and below umbilicus  ?DVT Evaluation: trace LE edema bilaterally, no calf tenderness to palpation  ? ?Labs: ?Lab Results  ?Component Value Date  ? WBC 16.3 (H) 10/19/2021  ? HGB 11.4 (L) 10/19/2021  ? HCT 35.1 (L) 10/19/2021  ? MCV 85.2 10/19/2021  ? PLT 291 10/19/2021  ? ? ?  Latest Ref Rng & Units 06/10/2021  ?  4:07 PM  ?CMP  ?Glucose 70 - 99 mg/dL 76    ?BUN 6 - 20 mg/dL 10    ?Creatinine 0.57 - 1.00 mg/dL 0.56    ?Sodium 134 - 144 mmol/L 137    ?Potassium 3.5 - 5.2 mmol/L 4.2    ?Chloride 96 - 106 mmol/L 104    ?CO2 20 - 29 mmol/L 21    ?Calcium 8.7 - 10.2 mg/dL 9.2    ?Total Protein 6.0 - 8.5 g/dL 6.4    ?Total Bilirubin 0.0 - 1.2 mg/dL 0.3    ?Alkaline Phos 44 - 121 IU/L 71    ?AST 0 - 40 IU/L 16    ?ALT 0 - 32  IU/L 15    ? ?Edinburgh Score: ? ?  10/19/2021  ?  6:04 PM  ?Flavia Shipper Postnatal Depression Scale Screening Tool  ?I have been able to laugh and see the funny side of things. 0  ?I have looked forward with enjoyment to things. 1  ?I have blamed myself unnecessarily when things went wrong. 1  ?I have been anxious or worried for no good reason. 2  ?I have felt scared or panicky for no good reason. 0  ?Things have been getting on top of me. 1  ?I have been so unhappy that I have had difficulty sleeping. 1  ?I have felt sad or miserable. 0  ?I have been so unhappy that I have been crying. 1  ?The thought of harming myself has occurred to me. 0  ?Edinburgh Postnatal Depression Scale Total 7  ? ? ? ?After visit meds:  ?Allergies as of 10/20/2021   ?No Known Allergies ?  ? ?  ?Medication List  ?  ? ?STOP taking these medications   ? ?famotidine 20 MG tablet ?Commonly known as: Pepcid ?  ?metoCLOPramide 10 MG tablet ?Commonly known as: Reglan ?  ? ?  ? ?TAKE  these medications   ? ?acetaminophen 500 MG tablet ?Commonly known as: TYLENOL ?Take 2 tablets (1,000 mg total) by mouth every 8 (eight) hours as needed (pain). ?What changed:  ?medication strength ?how much to take ?when to take this ?reasons to take this ?  ?cyclobenzaprine 5 MG tablet ?Commonly known as: FLEXERIL ?Take 1 tablet (5 mg total) by mouth 3 (three) times daily as needed for muscle spasms. ?  ?fluticasone 50 MCG/ACT nasal spray ?Commonly known as: FLONASE ?Place 1 spray into both nostrils daily. ?  ?ibuprofen 600 MG tablet ?Commonly known as: ADVIL ?Take 1 tablet (600 mg total) by mouth every 6 (six) hours as needed (pain). ?What changed:  ?medication strength ?how much to take ?reasons to take this ?  ?loratadine 10 MG tablet ?Commonly known as: CLARITIN ?Take 1 tablet (10 mg total) by mouth daily. ?  ?Magnesium Oxide 200 MG Tabs ?Can take $RemoveB'200mg'ENVLXihq$ -$Re'400mg'cIQ$  at bedtime as needed ?What changed:  ?how much to take ?how to take this ?when to take this ?additional instructions ?  ?PrePLUS 27-1 MG Tabs ?Take 1 tablet by mouth daily. ?  ? ?  ? ? ? ?Discharge home in stable condition ?Infant Feeding: Bottle and Breast ?Infant Disposition: home with mother ?Discharge instruction: per After Visit Summary and Postpartum booklet. ?Activity: Advance as tolerated. Pelvic rest for 6 weeks.  ?Diet: routine diet ?Future Appointments: ?Future Appointments  ?Date Time Provider [place]  ?11/25/2021  3:35 PM Gabriel Carina, CNM [facility]  ? ?Follow up Visit: ? ?Myrtis Ser, CNM  P [facility] Admin Pool ?Please schedule this patient for Postpartum visit in: 6 weeks with the following provider: Any provider  ?In-Person  ?For C/S patients schedule nurse incision check in weeks 2 weeks: No  ?High risk pregnancy complicated by: Preterm delivery 36.5  ?Delivery mode:  SVD  ?Anticipated Birth Control:  Partner vasectomy  ?PP Procedures needed: None  ?Schedule Integrated BH visit: No ? ?10/20/2021 ?Genia Del,  MD ? ? ? ?

## 2021-10-19 ENCOUNTER — Inpatient Hospital Stay (HOSPITAL_COMMUNITY): Payer: Medicaid Other | Admitting: Anesthesiology

## 2021-10-19 ENCOUNTER — Encounter (HOSPITAL_COMMUNITY)
Admission: AD | Disposition: A | Discharge status: Home / Self Care | Payer: Self-pay | Source: Home / Self Care | Attending: Obstetrics & Gynecology

## 2021-10-19 ENCOUNTER — Encounter: Payer: Self-pay | Admitting: Obstetrics and Gynecology

## 2021-10-19 ENCOUNTER — Inpatient Hospital Stay (HOSPITAL_COMMUNITY): Payer: Medicaid Other

## 2021-10-19 DIAGNOSIS — M79604 Pain in right leg: Secondary | ICD-10-CM

## 2021-10-19 LAB — CBC
HCT: 35.1 % — ABNORMAL LOW (ref 36.0–46.0)
Hemoglobin: 11.4 g/dL — ABNORMAL LOW (ref 12.0–15.0)
MCH: 27.7 pg (ref 26.0–34.0)
MCHC: 32.5 g/dL (ref 30.0–36.0)
MCV: 85.2 fL (ref 80.0–100.0)
Platelets: 291 10*3/uL (ref 150–400)
RBC: 4.12 MIL/uL (ref 3.87–5.11)
RDW: 14.6 % (ref 11.5–15.5)
WBC: 16.3 10*3/uL — ABNORMAL HIGH (ref 4.0–10.5)
nRBC: 0 % (ref 0.0–0.2)

## 2021-10-19 LAB — RPR: RPR Ser Ql: NONREACTIVE

## 2021-10-19 SURGERY — LIGATION, FALLOPIAN TUBE, POSTPARTUM
Anesthesia: Choice

## 2021-10-19 MED ORDER — OXYCODONE HCL 5 MG PO TABS
5.0000 mg | ORAL_TABLET | ORAL | Status: DC | PRN
  Administered 2021-10-20: 5 mg via ORAL
  Filled 2021-10-19: qty 1

## 2021-10-19 MED ORDER — ACETAMINOPHEN 325 MG PO TABS
650.0000 mg | ORAL_TABLET | ORAL | Status: DC | PRN
  Administered 2021-10-20 (×2): 650 mg via ORAL
  Filled 2021-10-19 (×2): qty 2

## 2021-10-19 MED ORDER — LACTATED RINGERS IV SOLN: INTRAVENOUS | Status: DC

## 2021-10-19 MED ORDER — IBUPROFEN 600 MG PO TABS
600.0000 mg | ORAL_TABLET | Freq: Four times a day (QID) | ORAL | Status: DC
  Administered 2021-10-19 – 2021-10-20 (×7): 600 mg via ORAL
  Filled 2021-10-19 (×7): qty 1

## 2021-10-19 MED ORDER — COCONUT OIL OIL: 1.0000 "application " | TOPICAL_OIL | Status: DC | PRN

## 2021-10-19 MED ORDER — DIBUCAINE (PERIANAL) 1 % EX OINT: 1.0000 "application " | TOPICAL_OINTMENT | CUTANEOUS | Status: DC | PRN

## 2021-10-19 MED ORDER — ZOLPIDEM TARTRATE 5 MG PO TABS: 5.0000 mg | ORAL_TABLET | Freq: Every evening | ORAL | Status: DC | PRN

## 2021-10-19 MED ORDER — ONDANSETRON HCL 4 MG PO TABS: 4.0000 mg | ORAL_TABLET | ORAL | Status: DC | PRN

## 2021-10-19 MED ORDER — ONDANSETRON HCL 4 MG/2ML IJ SOLN: 4.0000 mg | INTRAMUSCULAR | Status: DC | PRN

## 2021-10-19 MED ORDER — SENNOSIDES-DOCUSATE SODIUM 8.6-50 MG PO TABS
2.0000 | ORAL_TABLET | ORAL | Status: DC
  Administered 2021-10-19 – 2021-10-20 (×2): 2 via ORAL
  Filled 2021-10-19 (×2): qty 2

## 2021-10-19 MED ORDER — METOCLOPRAMIDE HCL 10 MG PO TABS: 10.0000 mg | ORAL_TABLET | Freq: Once | ORAL | Status: DC

## 2021-10-19 MED ORDER — PRENATAL MULTIVITAMIN CH
1.0000 | ORAL_TABLET | Freq: Every day | ORAL | Status: DC
  Administered 2021-10-19 – 2021-10-20 (×2): 1 via ORAL
  Filled 2021-10-19 (×2): qty 1

## 2021-10-19 MED ORDER — SIMETHICONE 80 MG PO CHEW: 80.0000 mg | CHEWABLE_TABLET | ORAL | Status: DC | PRN

## 2021-10-19 MED ORDER — FAMOTIDINE 20 MG PO TABS: 40.0000 mg | ORAL_TABLET | Freq: Once | ORAL | Status: DC

## 2021-10-19 MED ORDER — BENZOCAINE-MENTHOL 20-0.5 % EX AERO
1.0000 "application " | INHALATION_SPRAY | CUTANEOUS | Status: DC | PRN
  Filled 2021-10-19: qty 56

## 2021-10-19 MED ORDER — TETANUS-DIPHTH-ACELL PERTUSSIS 5-2.5-18.5 LF-MCG/0.5 IM SUSY: 0.5000 mL | PREFILLED_SYRINGE | Freq: Once | INTRAMUSCULAR | Status: DC

## 2021-10-19 MED ORDER — WITCH HAZEL-GLYCERIN EX PADS: 1.0000 "application " | MEDICATED_PAD | CUTANEOUS | Status: DC | PRN

## 2021-10-19 MED ORDER — MEASLES, MUMPS & RUBELLA VAC IJ SOLR: 0.5000 mL | Freq: Once | INTRAMUSCULAR | Status: DC

## 2021-10-19 MED ORDER — CYCLOBENZAPRINE HCL 10 MG PO TABS
5.0000 mg | ORAL_TABLET | Freq: Three times a day (TID) | ORAL | Status: DC | PRN
  Administered 2021-10-19: 5 mg via ORAL
  Filled 2021-10-19: qty 1

## 2021-10-19 MED ORDER — DIPHENHYDRAMINE HCL 25 MG PO CAPS: 25.0000 mg | ORAL_CAPSULE | Freq: Four times a day (QID) | ORAL | Status: DC | PRN

## 2021-10-19 NOTE — Discharge Instructions (Addendum)
Regional Vasectomy Program ?Font Size:+- ?[place], Press Enter to show all options, press Tab go to next option ?FeedbackPrint ?The Regional Vasectomy Program, based at the [facility], helps provide men with vasectomy services. This program, sponsored by the [place], is open to any female who is 32 years of age or older. The cost is on a sliding fee scale, based on family size and income, so that any man who wishes to obtain a vasectomy, regardless of income, is able to get one. This program is especially helpful for people who are uninsured or under-insured. The service is free for those who qualify. For more information on the Regional Vasectomy Program, or to make an appointment, call the program coordinator at [phone]. ?What is vasectomy? ?Vasectomy is a permanent birth control method for men who do not want to father any more children and is one of the most effective means of birth control. It is a safe and simple surgical procedure. ?What happens during the procedure? ?Vasectomy is usually performed in an urologist?s office under a local anesthetic. The Regional Vasectomy Program contracts with board-certified urologists statewide. After surgery, there may be some bruising, swelling and discomfort. You will need to take a break from sex, exercise and heavy lifting for one week after your vasectomy. ?Will I be sterile as soon as the procedure if over? ?No, not right away. To make sure the surgery was successful, several samples of semen must be taken back to the urology practice where the surgery was performed to test for the presence of any sperm. Most physicians require two negative (no sperm seen) samples in a row before you are told you may stop using temporary birth control. ?Does vasectomy prevent sexually transmitted infections? ?While this surgery should make you unable to father children, it will  not protect you from sexually transmitted infections. Always wear condoms unless you are in a committed relationship. ?What can be done if I change my mind? ?A reversal (vasovasostomy) can be performed, but the surgery is expensive, must be done in a hospital and may not be successful. ?If you or a man you know is interested in learning more about the Regional Vasectomy Program or would like to make an appointment, please call [phone]. ? ? ? ?If you have commercial insurance you can also contact the local urology office directly: ? ?[facility] ?[address] ?[address] ?[place], [place] ? ?Location Hours: ?8:00 a. m. - 5:00 p. m. ?[id number] ?[phone] ?

## 2021-10-19 NOTE — Progress Notes (Signed)
RLE venous duplex has been completed. ? ? ?Results can be found under chart review under CV PROC. ?10/19/2021 11:18 AM ?Kallum Jorgensen RVT, RDMS ? ?

## 2021-10-19 NOTE — Progress Notes (Addendum)
POSTPARTUM PROGRESS NOTE ? ?Post Partum Day 1 ? ?Subjective: ? ?Colleen Daniels is a 31 y.o. [phone] s/p vaginal delivery at [redacted]w[redacted]d.  No acute events overnight.  Pt denies problems with ambulating, voiding or po intake.  She admits to pain with ambulation, due to feeling of heaviness/soreness in both legs, with right leg worse than left. Pain is from knee down. She denies nausea or vomiting.  Pain is moderately controlled.  She has had flatus. She has not had bowel movement.  Lochia Minimal.  ? ?Objective: ?Blood pressure (!) 102/56, pulse 60, temperature 98.4 ?F (36.9 ?C), temperature source Oral, resp. rate 17, height 5\' 3"  (1.6 m), weight 96.8 kg, last menstrual period 02/03/2021, SpO2 99 %, unknown if currently breastfeeding. ? ?Physical Exam:  ?General: alert, cooperative and no distress ?Chest: no respiratory distress ?Heart:regular rate, distal pulses intact ?Abdomen: soft, nontender,  ?Uterine Fundus: firm, appropriately tender ?DVT Evaluation: No calf swelling, some tenderness to palpation ?Extremities: mild edema ?Skin: warm, dry ? ?Recent Labs  ?  10/18/21 ?1158 10/19/21 ?12/19/21  ?HGB 11.5* 11.4*  ?HCT 34.9* 35.1*  ? ?Results for orders placed or performed during the hospital encounter of 10/18/21 (from the past 24 hour(s))  ?Fern Test     Status: None  ? Collection Time: 10/18/21 11:01 AM  ?Result Value Ref Range  ? POCT Fern Test Positive = ruptured amniotic membanes   ?Type and screen [facility]     Status: None  ? Collection Time: 10/18/21 11:55 AM  ?Result Value Ref Range  ? ABO/RH(D) A POS   ? Antibody Screen NEG   ? Sample Expiration    ?  10/21/2021,2359 ?Performed at [facility], [address] [place], [place] ?  ?CBC     Status: Abnormal  ? Collection Time: 10/18/21 11:58 AM  ?Result Value Ref Range  ? WBC 10.7 (H) 4.0 - 10.5 K/uL  ? RBC 4.06 3.87 - 5.11 MIL/uL  ? Hemoglobin 11.5 (L) 12.0 - 15.0 g/dL  ? HCT 34.9 (L) 36.0 - 46.0 %  ? MCV 86.0 80.0 - 100.0  fL  ? MCH 28.3 26.0 - 34.0 pg  ? MCHC 33.0 30.0 - 36.0 g/dL  ? RDW 14.5 11.5 - 15.5 %  ? Platelets 272 150 - 400 K/uL  ? nRBC 0.0 0.0 - 0.2 %  ?CBC     Status: Abnormal  ? Collection Time: 10/19/21  3:46 AM  ?Result Value Ref Range  ? WBC 16.3 (H) 4.0 - 10.5 K/uL  ? RBC 4.12 3.87 - 5.11 MIL/uL  ? Hemoglobin 11.4 (L) 12.0 - 15.0 g/dL  ? HCT 35.1 (L) 36.0 - 46.0 %  ? MCV 85.2 80.0 - 100.0 fL  ? MCH 27.7 26.0 - 34.0 pg  ? MCHC 32.5 30.0 - 36.0 g/dL  ? RDW 14.6 11.5 - 15.5 %  ? Platelets 291 150 - 400 K/uL  ? nRBC 0.0 0.0 - 0.2 %  ?  ? ?Assessment/Plan: ?Colleen Daniels is a 31 y.o. [phone] s/p vaginal delivery at [redacted]w[redacted]d  ? ?PPD#1 - Doing well; U/S ordered to rule out DVT, but low clinical suspicion since pain is bilateral, no marked erythema.  ?Contraception: BTL, scheduled for 3pm today ?Feeding: breast and bottle ?Dispo: Plan for discharge on 5/2. ? ? LOS: 1 day  ? ?7/2, Student-PA ?10/19/2021, 6:13 AM   ?

## 2021-10-19 NOTE — Lactation Note (Signed)
This note was copied from a baby's chart. ?Lactation Consultation Note ?Mom awake, room dark. Baby sleeping. Mom stated the baby finally BF. Mom was happy about that. ?Lab just left to draw baby's glucose. ?LPI information sheet.  ?Mom is BF/formula feeding by choice. ?LC discussed mom pumping since baby is LPI and needs to be supplemented mom in agreement. ?LC set pump up, mom tired and would like to rest. Asked mom to call in am when she is ready for pumping and someone will show her how to use it. ?Suggested mom call for latch to see latch. ? ?Patient Name: Colleen Daniels ?Today's Date: 10/19/2021 ?Reason for consult: Initial assessment;Late-preterm 34-36.6wks ?Age:26 hours ? ?Maternal Data ?Does the patient have breastfeeding experience prior to this delivery?: Yes ?How long did the patient breastfeed?: 6 months each ? ?Feeding ?  ? ?LATCH Score ?Latch: Too sleepy or reluctant, no latch achieved, no sucking elicited. ? ?Audible Swallowing: None ? ?Type of Nipple: Everted at rest and after stimulation ? ?Comfort (Breast/Nipple): Soft / non-tender ? ?Hold (Positioning): Assistance needed to correctly position infant at breast and maintain latch. ? ?LATCH Score: 5 ? ? ?Lactation Tools Discussed/Used ?Tools: Pump ?Breast pump type: Double-Electric Breast Pump ?Pump Education:  (mom tired) ?Reason for Pumping: LPI ?Pumping frequency: q3h ? ?Interventions ?Interventions: Breast feeding basics reviewed;DEBP;LC Services brochure;LPT handout/interventions ? ?Discharge ?  ? ?Consult Status ?Consult Status: Follow-up ?Date: 10/19/21 ?Follow-up type: In-patient ? ? ? ?[name] ?10/19/2021, 1:02 AM ? ? ? ?

## 2021-10-19 NOTE — Progress Notes (Signed)
CSW acknowledged consult for food insecurity and met with MOB at bedside. CSW introduced self and explained reason for consult. MOB was welcoming, pleasant, and laying in bed while holding infant. CSW inquired about food insecurities, MOB denied any food insecurity currently. MOB shared that she is signed up for WIC and interested in food stamps. CSW sent MOB the link to apply for food stamps. MOB confirmed having most items needed to care for infant including a car seat and crib. MOB reported that they needed to get some little things like a changing mat and confirmed that they can get all necessary items for infant. CSW inquired about any additional needs/concerns, MOB reported that she is interested in Medicaid for herself and infant. CSW agreed to notify financial counselor and request that they follow up with MOB, MOB agreeable. CSW asked if there was anything that CSW could do to be helpful, MOB reported no needs. Parents thanked CSW.  ? ?CSW emailed financial counselor and requested follow up per MOB's request. ? ?Colleen Gewirtz, LCSW ?Clinical Social Worker ?[facility] ?Cell#: [phone] ?

## 2021-10-19 NOTE — Progress Notes (Signed)
Upon 1st 4hr check, mom complained of lower legs hurting/cramping and feeling a little numb. Described her discomfort as feeling like she ran a marathon. RN ran hands under calves and mom experienced discomfort. No redness but some swelling. Mom has been NPO since midnight. RN asked about positioning of legs during delivery. RN will mention to next shift and continue to monitor ?

## 2021-10-19 NOTE — Lactation Note (Addendum)
This note was copied from a baby's chart. ?Lactation Consultation Note ? ?Patient Name: Colleen Daniels ?Today's Date: 10/19/2021 ?Reason for consult: Follow-up assessment;Late-preterm 34-36.6wks ?Age:32 hours ? ?P4 mother whose infant is now 76 hours old.  This is a late preterm baby at 36+5 weeks weighing > 6 lbs.  Mother's current feeding preference is breast. ? ?Baby was asleep on mother's chest when I arrived.  Mother reported good breast feeding sessions from 12-20 minutes/session.  Mother feels comfortable with the latch and is able to hear occasional swallows.  She has seen colostrum around his mouth at the end of the feedings.  He is quiet and content after feedings; multiple voids/stools noted. ? ?Reviewed the LPTI guidelines with mother.  Discussed the possibility of supplementing in the future if she begins to feel like he is not latching and feeding.  Asked mother to have her RN/LC observe a latch for verification and assistance if needed.  Mother stated that he is showing cues.  Suggested feeding at least every three hours or sooner if he shows cues.  Mother has not been pumping and asked her to begin for breast stimulation even if she does not observe any colostrum drops; mother verbalized understanding.  Night shift LC has pump in room.  Mother did not desire any review at this time. ? ?Father asleep on the couch.  RN updated and will have night shift RN follow closely for feeding progression and/or possible supplementation. [facility] referral faxed. ? ? ?Maternal Data ?Has patient been taught Hand Expression?: Yes ?Does the patient have breastfeeding experience prior to this delivery?: Yes ?How long did the patient breastfeed?: 6 months with each of her other three children ? ?Feeding ?Mother's Current Feeding Choice: Breast Milk ? ?LATCH Score ?  ? ?  ? ?  ? ?  ? ?  ? ?  ? ? ?Lactation Tools Discussed/Used ?Tools: Pump;Flanges ?Flange Size: 24;27 ?Breast pump type: Double-Electric Breast  Pump ?Pump Education: Setup, frequency, and cleaning (Mother stated she has not been pumping, but needs no assistance with the pump) ?Reason for Pumping: Breast stimulation for supplementation ?Pumping frequency: Every three hours ? ?Interventions ?Interventions: Breast feeding basics reviewed;Education;DEBP;Hand pump ? ?Discharge ?Pump: DEBP;Manual (Mother does not currently have a pump; will contact [place]) ?[place] Program: Yes ? ?Consult Status ?Consult Status: Follow-up ?Date: 10/20/21 ?Follow-up type: In-patient ? ? ? ?Maximus Hoffert R [place] ?10/19/2021, 2:18 PM ? ? ? ?

## 2021-10-20 ENCOUNTER — Other Ambulatory Visit (HOSPITAL_COMMUNITY): Payer: Self-pay

## 2021-10-20 MED ORDER — ACETAMINOPHEN 500 MG PO TABS: 1000.0000 mg | ORAL_TABLET | Freq: Three times a day (TID) | ORAL | 0 refills | Status: DC | PRN

## 2021-10-20 MED ORDER — ACETAMINOPHEN 500 MG PO TABS
1000.0000 mg | ORAL_TABLET | Freq: Three times a day (TID) | ORAL | 0 refills | Status: AC | PRN
Start: 2021-10-20 — End: ?
  Filled 2021-10-20: qty 60, 10d supply, fill #0

## 2021-10-20 MED ORDER — IBUPROFEN 600 MG PO TABS: 600.0000 mg | ORAL_TABLET | Freq: Four times a day (QID) | ORAL | 0 refills | Status: DC | PRN

## 2021-10-20 MED ORDER — IBUPROFEN 600 MG PO TABS
600.0000 mg | ORAL_TABLET | Freq: Four times a day (QID) | ORAL | 0 refills | Status: AC | PRN
Start: 2021-10-20 — End: ?
  Filled 2021-10-20: qty 40, 10d supply, fill #0

## 2021-10-20 MED ORDER — CYCLOBENZAPRINE HCL 5 MG PO TABS
5.0000 mg | ORAL_TABLET | Freq: Three times a day (TID) | ORAL | 0 refills | Status: DC | PRN
  Filled 2021-10-20: qty 15, 5d supply, fill #0

## 2021-10-20 NOTE — Lactation Note (Addendum)
This note was copied from a baby's chart. ?Lactation Consultation Note ? ?Patient Name: Colleen Daniels ?Today's Date: 10/20/2021 ?Reason for consult: Follow-up assessment;Mother's request;Early term 37-38.6wks;Breastfeeding assistance;Infant weight loss ?Age:32 hours ? ?Mom recent latch and bottle feeding prior to [facility] arrival. Mom complained of pain with use of DEBP, flange size increased 27, pain resolved and milk began to flow. ? ?Mom denied any pain with latching. No signs of nipple trauma noted.  ? ?Plan 1. To feed based on cues 8-12x 24hr period. Mom to offer breasts and look for signs of milk transfer.  ?2. Mom to supplement with EBM first followed by formula with pace bottle feeding using slow flow nipple 10-20 ml per feeding.  ?3. DEBP q 3hrs for 15 min  ? ?All questions answered at the end of the visit.  ? ?Maternal Data ?Has patient been taught Hand Expression?: Yes ? ?Feeding ?Mother's Current Feeding Choice: Breast Milk and Formula ?Nipple Type: Slow - flow ? ?LATCH Score ?  ? ?  ? ?  ? ?  ? ?  ? ?  ? ? ?Lactation Tools Discussed/Used ?Tools: Pump;Flanges ?Flange Size: 27 (Mom some pain with use of 24 flange, increased to 27 better fit and milk began to flow) ?Pump Education: Setup, frequency, and cleaning;Milk Storage ?Reason for Pumping: increase stimulation ?Pumping frequency: every 3 hrs for 15 min ? ?Interventions ?Interventions: Breast feeding basics reviewed;Hand express;Breast compression;Expressed milk;DEBP;Education;Pace feeding;LC Services brochure;LPT handout/interventions ? ?Discharge ?Pump: DEBP ? ?Consult Status ?Consult Status: Follow-up ?Date: 10/21/21 ?Follow-up type: In-patient ? ? ? ?Colleen Thedford  Daniels ?10/20/2021, 4:05 PM ? ? ? ?

## 2021-10-23 ENCOUNTER — Telehealth: Payer: Self-pay

## 2021-10-23 NOTE — Telephone Encounter (Signed)
Call transferred from front office. Patient stated she is experiencing some swelling in her feet. Patient had a vaginal delivery on 10/18/21. I inquired if patient was experiencing any other symptoms and patient stated she was having a headache. While on the phone I had patient check her blood pressure and patient stated it was 116/71. I reviewed signs and symptoms of pre-eclampsia with patient and recommended patient continue to check her blood pressure. I explained to patient that some swelling in the feet after delivery is not uncommon and I recommended patient try ambulating as tolerated, elevating her feet when resting and drinking lots of fluid. I recommended patient try taking Ibuprofen or Tylenol to aid with the headache as well as trying to get adequate sleep and hydration. I told patient to let us know if she has any other questions or concerns. Patient verbalized understanding.  ? ?Paulina Fusi, RN ?10/23/21 ?

## 2021-10-25 ENCOUNTER — Inpatient Hospital Stay (HOSPITAL_COMMUNITY)
Admission: AD | Admit: 2021-10-25 | Discharge: 2021-10-25 | Disposition: A | Discharge status: Home / Self Care | Payer: Self-pay | Attending: Obstetrics and Gynecology | Admitting: Obstetrics and Gynecology

## 2021-10-25 ENCOUNTER — Other Ambulatory Visit: Payer: Self-pay

## 2021-10-25 ENCOUNTER — Encounter: Payer: Self-pay | Admitting: Family Medicine

## 2021-10-25 ENCOUNTER — Encounter (HOSPITAL_COMMUNITY): Payer: Self-pay | Admitting: Obstetrics and Gynecology

## 2021-10-25 DIAGNOSIS — B9689 Other specified bacterial agents as the cause of diseases classified elsewhere: Secondary | ICD-10-CM | POA: Insufficient documentation

## 2021-10-25 DIAGNOSIS — O8622 Infection of bladder following delivery: Secondary | ICD-10-CM | POA: Insufficient documentation

## 2021-10-25 DIAGNOSIS — N3001 Acute cystitis with hematuria: Secondary | ICD-10-CM | POA: Insufficient documentation

## 2021-10-25 LAB — CBC WITH DIFFERENTIAL/PLATELET
Abs Immature Granulocytes: 0.04 10*3/uL (ref 0.00–0.07)
Basophils Absolute: 0.1 10*3/uL (ref 0.0–0.1)
Basophils Relative: 1 %
Eosinophils Absolute: 0.3 10*3/uL (ref 0.0–0.5)
Eosinophils Relative: 4 %
HCT: 38.1 % (ref 36.0–46.0)
Hemoglobin: 12.4 g/dL (ref 12.0–15.0)
Immature Granulocytes: 0 %
Lymphocytes Relative: 21 %
Lymphs Abs: 1.9 10*3/uL (ref 0.7–4.0)
MCH: 28 pg (ref 26.0–34.0)
MCHC: 32.5 g/dL (ref 30.0–36.0)
MCV: 86 fL (ref 80.0–100.0)
Monocytes Absolute: 0.7 10*3/uL (ref 0.1–1.0)
Monocytes Relative: 8 %
Neutro Abs: 6.2 10*3/uL (ref 1.7–7.7)
Neutrophils Relative %: 66 %
Platelets: 339 10*3/uL (ref 150–400)
RBC: 4.43 MIL/uL (ref 3.87–5.11)
RDW: 14.5 % (ref 11.5–15.5)
WBC: 9.3 10*3/uL (ref 4.0–10.5)
nRBC: 0 % (ref 0.0–0.2)

## 2021-10-25 LAB — URINALYSIS, ROUTINE W REFLEX MICROSCOPIC
Bilirubin Urine: NEGATIVE
Glucose, UA: NEGATIVE mg/dL
Ketones, ur: NEGATIVE mg/dL
Nitrite: NEGATIVE
Protein, ur: NEGATIVE mg/dL
RBC / HPF: >50 RBC/hpf — ABNORMAL HIGH (ref 0–5)
Specific Gravity, Urine: 1.018 (ref 1.005–1.030)
pH: 5 (ref 5.0–8.0)

## 2021-10-25 MED ORDER — CEFADROXIL 500 MG PO CAPS: 500.0000 mg | ORAL_CAPSULE | Freq: Two times a day (BID) | ORAL | 0 refills | Status: AC

## 2021-10-25 MED ORDER — PHENAZOPYRIDINE HCL 200 MG PO TABS: 200.0000 mg | ORAL_TABLET | Freq: Three times a day (TID) | ORAL | 0 refills | Status: DC | PRN

## 2021-10-25 MED ORDER — CYCLOBENZAPRINE HCL 5 MG PO TABS
10.0000 mg | ORAL_TABLET | Freq: Once | ORAL | Status: AC
  Administered 2021-10-25: 10 mg via ORAL
  Filled 2021-10-25: qty 2

## 2021-10-25 NOTE — MAU Provider Note (Signed)
?History  ?  ? ?CSN: [id number] ? ?Arrival date and time: 10/25/21 2203 ? ? Event Date/Time  ? First Provider Initiated Contact with Patient 10/25/21 2249   ?  ? ?Chief Complaint  ?Patient presents with  ? Dysuria  ? ?HPI ?Colleen Daniels is a 31 y.o. [id number] postpartum from a vaginal delivery on 4/30 who presents with dysuria. She reports once last night and twice today she has had pain in her lower abdomen when she goes to urinate. She reports it feels like a spasm and then she is able to urinate. When the pain comes, it is an 8/10. She has tried tylenol and ibuprofen with no relief. She denies any fever or foul smelling lochia. She is breast feeding and denies any tenderness or redness in her breasts.  ? ?OB History   ? ? Gravida  ?4  ? Para  ?4  ? Term  ?3  ? Preterm  ?1  ? AB  ?0  ? Living  ?4  ?  ? ? SAB  ?0  ? IAB  ?0  ? Ectopic  ?0  ? Multiple  ?0  ? Live Births  ?4  ?   ?  ?  ? ? ?Past Medical History:  ?Diagnosis Date  ? Allergy   ? Seasonal  ? COVID-19 10/2018  ? developed pneumonia  ? Dysmenorrhea   ? Headache   ? Hepatitis B   ? as a child  ? IBS (irritable bowel syndrome)   ? No pertinent past medical history   ? Pelvic congestion syndrome   ? PID (acute pelvic inflammatory disease)   ? ? ?Past Surgical History:  ?Procedure Laterality Date  ? COLONOSCOPY  2016  ? done in [place] - confirmed IBS mixed   ? ? ?Family History  ?Problem Relation Age of Onset  ? Hypertension Mother   ? Diabetes Mother   ? Thyroid disease Mother   ? Stroke Maternal Grandmother   ? Diabetes Maternal Grandmother   ? Hypertension Maternal Grandmother   ? Heart attack Maternal Grandmother   ? Osteoporosis Maternal Grandmother   ? Hyperlipidemia Paternal Grandmother   ? ? ?Social History  ? ?Tobacco Use  ? Smoking status: Former  ?  Types: Cigarettes  ?  Quit date: 06/21/2008  ?  Years since quitting: 13.3  ? Smokeless tobacco: Never  ?Vaping Use  ? Vaping Use: Never used  ?Substance Use Topics  ? Alcohol use: No  ? Drug  use: No  ? ? ?Allergies: No Known Allergies ? ?Medications Prior to Admission  ?Medication Sig Dispense Refill Last Dose  ? acetaminophen (TYLENOL) 500 MG tablet Take 2 tablets (1,000 mg total) by mouth every 8 (eight) hours as needed (pain). 60 tablet 0   ? cyclobenzaprine (FLEXERIL) 5 MG tablet Take 1 tablet (5 mg total) by mouth 3 (three) times daily as needed for muscle spasms. 15 tablet 0   ? fluticasone (FLONASE) 50 MCG/ACT nasal spray Place 1 spray into both nostrils daily. 18.2 mL 2   ? ibuprofen (ADVIL) 600 MG tablet Take 1 tablet (600 mg total) by mouth every 6 (six) hours as needed (pain). 40 tablet 0   ? loratadine (CLARITIN) 10 MG tablet Take 1 tablet (10 mg total) by mouth daily. 30 tablet 11   ? Magnesium Oxide 200 MG TABS Can take 200mg -400mg  at bedtime as needed (Patient taking differently: Take 200 mg by mouth daily.) 30 tablet 1   ? Prenatal Vit-Fe Fumarate-FA (  PREPLUS) 27-1 MG TABS Take 1 tablet by mouth daily. 30 tablet 13   ? ? ?Review of Systems  ?Constitutional: Negative.  Negative for fatigue and fever.  ?HENT: Negative.    ?Respiratory: Negative.  Negative for shortness of breath.   ?Cardiovascular: Negative.  Negative for chest pain.  ?Gastrointestinal: Negative.  Negative for abdominal pain, constipation, diarrhea, nausea and vomiting.  ?Genitourinary:  Positive for dysuria. Negative for vaginal bleeding and vaginal discharge.  ?Neurological: Negative.  Negative for dizziness and headaches.  ?Physical Exam  ? ?Blood pressure 109/69, pulse 70, temperature 98.8 ?F (37.1 ?C), resp. rate 18, height 5\' 3"  (1.6 m), weight 93 kg, unknown if currently breastfeeding. ? ?Physical Exam ?Vitals and nursing note reviewed.  ?Constitutional:   ?   General: She is not in acute distress. ?   Appearance: She is well-developed.  ?HENT:  ?   Head: Normocephalic.  ?Eyes:  ?   Pupils: Pupils are equal, round, and reactive to light.  ?Cardiovascular:  ?   Rate and Rhythm: Normal rate and regular rhythm.  ?    Heart sounds: Normal heart sounds.  ?Pulmonary:  ?   Effort: Pulmonary effort is normal. No respiratory distress.  ?   Breath sounds: Normal breath sounds.  ?Abdominal:  ?   General: Bowel sounds are normal. There is no distension.  ?   Palpations: Abdomen is soft.  ?   Tenderness: There is no abdominal tenderness.  ?Skin: ?   General: Skin is warm and dry.  ?Neurological:  ?   Mental Status: She is alert and oriented to person, place, and time.  ?Psychiatric:     ?   Mood and Affect: Mood normal.     ?   Behavior: Behavior normal.     ?   Thought Content: Thought content normal.     ?   Judgment: Judgment normal.  ? ? ?MAU Course  ?Procedures ?Results for orders placed or performed during the hospital encounter of 10/25/21 (from the past 24 hour(s))  ?Urinalysis, Routine w reflex microscopic     Status: Abnormal  ? Collection Time: 10/25/21 10:43 PM  ?Result Value Ref Range  ? Color, Urine YELLOW YELLOW  ? APPearance CLEAR CLEAR  ? Specific Gravity, Urine 1.018 1.005 - 1.030  ? pH 5.0 5.0 - 8.0  ? Glucose, UA NEGATIVE NEGATIVE mg/dL  ? Hgb urine dipstick LARGE (A) NEGATIVE  ? Bilirubin Urine NEGATIVE NEGATIVE  ? Ketones, ur NEGATIVE NEGATIVE mg/dL  ? Protein, ur NEGATIVE NEGATIVE mg/dL  ? Nitrite NEGATIVE NEGATIVE  ? Leukocytes,Ua MODERATE (A) NEGATIVE  ? RBC / HPF >50 (H) 0 - 5 RBC/hpf  ? WBC, UA 11-20 0 - 5 WBC/hpf  ? Bacteria, UA FEW (A) NONE SEEN  ? Squamous Epithelial / LPF 0-5 0 - 5  ?CBC with Differential/Platelet     Status: None  ? Collection Time: 10/25/21 11:07 PM  ?Result Value Ref Range  ? WBC 9.3 4.0 - 10.5 K/uL  ? RBC 4.43 3.87 - 5.11 MIL/uL  ? Hemoglobin 12.4 12.0 - 15.0 g/dL  ? HCT 38.1 36.0 - 46.0 %  ? MCV 86.0 80.0 - 100.0 fL  ? MCH 28.0 26.0 - 34.0 pg  ? MCHC 32.5 30.0 - 36.0 g/dL  ? RDW 14.5 11.5 - 15.5 %  ? Platelets 339 150 - 400 K/uL  ? nRBC 0.0 0.0 - 0.2 %  ? Neutrophils Relative % 66 %  ? Neutro Abs 6.2 1.7 - 7.7  K/uL  ? Lymphocytes Relative 21 %  ? Lymphs Abs 1.9 0.7 - 4.0 K/uL  ?  Monocytes Relative 8 %  ? Monocytes Absolute 0.7 0.1 - 1.0 K/uL  ? Eosinophils Relative 4 %  ? Eosinophils Absolute 0.3 0.0 - 0.5 K/uL  ? Basophils Relative 1 %  ? Basophils Absolute 0.1 0.0 - 0.1 K/uL  ? Immature Granulocytes 0 %  ? Abs Immature Granulocytes 0.04 0.00 - 0.07 K/uL  ?  ?MDM ?UA, UC ?CBC with Diff ?Flexeril ? ?Assessment and Plan  ? ?1. Acute cystitis with hematuria   ?2. Postpartum state   ? ?-Discharge home in stable condition ?-Rx for duracef and pyridium sent to patient's pharmacy ?-Postpartum precautions discussed ?-Patient advised to follow-up with OB as scheduled for postpartum care ?-Patient may return to MAU as needed or if her condition were to change or worsen ? ? ?Wende Mott CNM ?10/25/2021, 10:49 PM  ?

## 2021-10-25 NOTE — MAU Note (Addendum)
.  Colleen Daniels is a 31 y.o. at Unknown here in MAU reporting: PP vag delivery 10/18/21. Has been having pain with urination today and increased cramping. C/o some chills and headache. Has taken tylenol for discomfort but has not helped. Also reports feet and leg swelling.  ?LMP: n/a ?Onset of complaint: today ?Pain score: 8 cramps 8 headache ?There were no vitals filed for this visit.   ?FHT:n/a ?Lab orders placed from triage:  u/a ? ?

## 2021-10-26 ENCOUNTER — Telehealth (HOSPITAL_COMMUNITY): Payer: Self-pay | Admitting: *Deleted

## 2021-10-26 NOTE — Telephone Encounter (Signed)
Patient reported that she was seen in MAU last night and given a prescription for an antibiotic to treat a bladder infection. Husband to pick up antibiotics today on his way home from work, per patient report. Patient voiced no other questions or concerns regarding her health at this time. EPDS=8. Patient voiced no questions or concerns regarding infant at this time. Patient reports infant sleeps in a bassinet on his back. RN reviewed ABCs of safe sleep. Patient verbalized understanding. Patient informed about hospital's virtual postpartum classes and support groups. Declined email information at this time. Deforest Hoyles, RN, 10/26/21, [phone] ?

## 2021-10-27 LAB — CULTURE, OB URINE: Culture: 5000 — AB

## 2021-11-07 ENCOUNTER — Encounter (HOSPITAL_COMMUNITY): Payer: Self-pay | Admitting: Obstetrics & Gynecology

## 2021-11-07 ENCOUNTER — Other Ambulatory Visit: Payer: Self-pay

## 2021-11-07 ENCOUNTER — Inpatient Hospital Stay (HOSPITAL_COMMUNITY)
Admission: AD | Admit: 2021-11-07 | Discharge: 2021-11-07 | Disposition: A | Discharge status: Home / Self Care | Payer: Self-pay | Attending: Obstetrics & Gynecology | Admitting: Obstetrics & Gynecology

## 2021-11-07 DIAGNOSIS — O9122 Nonpurulent mastitis associated with the puerperium: Secondary | ICD-10-CM

## 2021-11-07 DIAGNOSIS — N61 Mastitis without abscess: Secondary | ICD-10-CM | POA: Insufficient documentation

## 2021-11-07 DIAGNOSIS — R110 Nausea: Secondary | ICD-10-CM | POA: Insufficient documentation

## 2021-11-07 LAB — URINALYSIS, ROUTINE W REFLEX MICROSCOPIC
Bilirubin Urine: NEGATIVE
Glucose, UA: NEGATIVE mg/dL
Ketones, ur: NEGATIVE mg/dL
Nitrite: NEGATIVE
Protein, ur: NEGATIVE mg/dL
Specific Gravity, Urine: 1.032 — ABNORMAL HIGH (ref 1.005–1.030)
pH: 5 (ref 5.0–8.0)

## 2021-11-07 MED ORDER — IBUPROFEN 600 MG PO TABS
600.0000 mg | ORAL_TABLET | Freq: Four times a day (QID) | ORAL | Status: DC | PRN
  Administered 2021-11-07: 600 mg via ORAL
  Filled 2021-11-07: qty 1

## 2021-11-07 MED ORDER — LACTATED RINGERS IV BOLUS
1000.0000 mL | Freq: Once | INTRAVENOUS | Status: AC
  Administered 2021-11-07: 1000 mL via INTRAVENOUS

## 2021-11-07 MED ORDER — DICLOXACILLIN SODIUM 500 MG PO CAPS: 500.0000 mg | ORAL_CAPSULE | Freq: Four times a day (QID) | ORAL | 0 refills | Status: DC

## 2021-11-07 MED ORDER — DICLOXACILLIN SODIUM 250 MG PO CAPS
500.0000 mg | ORAL_CAPSULE | Freq: Four times a day (QID) | ORAL | Status: DC
  Administered 2021-11-07: 500 mg via ORAL
  Filled 2021-11-07 (×2): qty 1
  Filled 2021-11-07: qty 2

## 2021-11-07 NOTE — MAU Note (Incomplete)
Colleen Daniels is a 31 y.o. at Unknown here in MAU reporting: *** LMP: *** Onset of complaint: *** Pain score: *** There were no vitals filed for this visit.   FHT:*** Lab orders placed from triage:

## 2021-11-07 NOTE — MAU Note (Signed)
Pt breastfeeding on R side -  Assisted with latch and position of newborn

## 2021-11-07 NOTE — MAU Note (Signed)
.  Colleen Daniels is a 31 y.o. following NSVD on 10/18/2021  here in MAU reporting: Pain in R breast that began Friday. Reports redness around areola and upper front of breast. Today she began to have a fever with her Tmax. 100.6. Pt reports taking tylenol 1000mg  at 1720. Pt is currently breast feeding Onset of complaint: Friday          Pain score: 10 Vitals:   11/07/21 2120  BP: (!) 110/58  Pulse: 94  Resp: 18  Temp: 98.1 F (36.7 C)  SpO2: 98%      Lab orders placed from triage: UA

## 2021-11-07 NOTE — MAU Provider Note (Signed)
History     [id number]  Arrival date and time: 11/07/21 2051    Chief Complaint  Patient presents with   Fever     HPI Colleen Daniels is a 31 y.o.  G4P4 s/p uncomplicated NSVD on 10/18/21, who presents for breast tenderness and fever.  Breast tenderness started on Friday and has gotten progressively worse along with redness.  Took 1000mg  tylenol today around 1730.  Noted temp as high as 100.6 at home. Overall states she just doesn't feel well.  OB History     Gravida  4   Para  4   Term  3   Preterm  1   AB  0   Living  4      SAB  0   IAB  0   Ectopic  0   Multiple  0   Live Births  4           Past Medical History:  Diagnosis Date   Allergy    Seasonal   COVID-19 10/2018   developed pneumonia   Dysmenorrhea    Headache    Hepatitis B    as a child   IBS (irritable bowel syndrome)    No pertinent past medical history    Pelvic congestion syndrome    PID (acute pelvic inflammatory disease)     Past Surgical History:  Procedure Laterality Date   COLONOSCOPY  2016   done in 2017 - confirmed IBS mixed     Family History  Problem Relation Age of Onset   Hypertension Mother    Diabetes Mother    Thyroid disease Mother    Stroke Maternal Grandmother    Diabetes Maternal Grandmother    Hypertension Maternal Grandmother    Heart attack Maternal Grandmother    Osteoporosis Maternal Grandmother    Hyperlipidemia Paternal Grandmother     No Known Allergies  No current facility-administered medications on file prior to encounter.   Current Outpatient Medications on File Prior to Encounter  Medication Sig Dispense Refill   acetaminophen (TYLENOL) 500 MG tablet Take 2 tablets (1,000 mg total) by mouth every 8 (eight) hours as needed (pain). 60 tablet 0   ibuprofen (ADVIL) 600 MG tablet Take 1 tablet (600 mg total) by mouth every 6 (six) hours as needed (pain). 40 tablet 0   loratadine (CLARITIN) 10 MG tablet Take 1 tablet (10  mg total) by mouth daily. 30 tablet 11   Prenatal Vit-Fe Fumarate-FA (PREPLUS) 27-1 MG TABS Take 1 tablet by mouth daily. 30 tablet 13   cyclobenzaprine (FLEXERIL) 5 MG tablet Take 1 tablet (5 mg total) by mouth 3 (three) times daily as needed for muscle spasms. 15 tablet 0   fluticasone (FLONASE) 50 MCG/ACT nasal spray Place 1 spray into both nostrils daily. 18.2 mL 2   Magnesium Oxide 200 MG TABS Can take 200mg -400mg  at bedtime as needed (Patient taking differently: Take 200 mg by mouth daily.) 30 tablet 1   phenazopyridine (PYRIDIUM) 200 MG tablet Take 1 tablet (200 mg total) by mouth 3 (three) times daily as needed for pain. 20 tablet 0   [DISCONTINUED] pantoprazole (PROTONIX) 40 MG tablet Take 1 tablet (40 mg total) by mouth daily. (Patient not taking: No sig reported) 30 tablet 0     Review of Systems  Constitutional:  Positive for chills, fever and malaise/fatigue.  Eyes: Negative.   Respiratory: Negative.    Cardiovascular: Negative.   Gastrointestinal:  Positive for nausea. Negative for constipation, diarrhea  and vomiting.  Genitourinary:  Positive for dysuria. Negative for frequency, hematuria and urgency.  Musculoskeletal:  Positive for back pain and myalgias.  Skin:  Negative for itching and rash.  Neurological:  Negative for dizziness and headaches.  Psychiatric/Behavioral:  Negative for depression. The patient is not nervous/anxious.   Pertinent positives and negative per HPI, all others reviewed and negative  Physical Exam   BP (!) 110/58 (BP Location: Right Arm)   Pulse 94   Temp 98.1 F (36.7 C) (Oral)   Resp 18   Ht 5\' 3"  (1.6 m)   Wt 91.2 kg   SpO2 98%   BMI 35.61 kg/m   Patient Vitals for the past 24 hrs:  BP Temp Temp src Pulse Resp SpO2 Height Weight  11/07/21 2120 (!) 110/58 98.1 F (36.7 C) Oral 94 18 98 % 5\' 3"  (1.6 m) 91.2 kg    Physical Exam Constitutional:      Appearance: Normal appearance.  HENT:     Head: Normocephalic.  Eyes:      Extraocular Movements: Extraocular movements intact.  Cardiovascular:     Rate and Rhythm: Normal rate and regular rhythm.  Pulmonary:     Effort: Pulmonary effort is normal. No respiratory distress.     Breath sounds: Normal breath sounds. No wheezing.  Abdominal:     General: There is no distension.     Palpations: Abdomen is soft.     Tenderness: There is no abdominal tenderness. There is no guarding or rebound.  Musculoskeletal:        General: No swelling or tenderness.     Cervical back: Normal range of motion and neck supple.  Skin:    General: Skin is warm and dry.  Neurological:     Mental Status: She is alert and oriented to person, place, and time.  Psychiatric:        Mood and Affect: Mood normal.        Behavior: Behavior normal.   Back: No CVA tenderness Breast -left breast: no masses or tenderness noted.  Right breast- upper outer quadrant with erythema, engorgement and tenderness- no discrete mass palpated, milk let down noted from both breast   MAU Course  Procedures Lab Orders         Urinalysis, Routine w reflex microscopic Urine, Clean Catch     Meds ordered this encounter  Medications   lactated ringers bolus 1,000 mL   ibuprofen (ADVIL) tablet 600 mg   dicloxacillin (DYNAPEN) capsule 500 mg   dicloxacillin (DYNAPEN) 500 MG capsule    Sig: Take 1 capsule (500 mg total) by mouth every 6 (six) hours for 10 days.    Dispense:  40 capsule    Refill:  0   Imaging Orders  No imaging studies ordered today    MDM -Findings suggestive of mastitis -IV fluids LR bolus -po Ibuprofen and started on dicloxacillin -noted improvement s/p fluids  Assessment and Plan   1. Mastitis associated with childbirth, delivered   2. Nausea    -pt to complete full course of antibiotic -alternate between ibuprofen and tylenol -warm compresses prn -encouraged continued feeding and/or pumping -pt to f/u in clinic within 1-2 wks or sooner if worsening of  symptoms -Questions and concerns were addressed   2121, DO 11/07/21 11:22 PM

## 2021-11-08 ENCOUNTER — Other Ambulatory Visit (INDEPENDENT_AMBULATORY_CARE_PROVIDER_SITE_OTHER): Payer: Self-pay | Admitting: Obstetrics & Gynecology

## 2021-11-08 ENCOUNTER — Other Ambulatory Visit: Payer: Self-pay | Admitting: Obstetrics and Gynecology

## 2021-11-08 MED ORDER — DICLOXACILLIN SODIUM 500 MG PO CAPS: 500.0000 mg | ORAL_CAPSULE | Freq: Four times a day (QID) | ORAL | 0 refills | Status: AC

## 2021-11-25 ENCOUNTER — Ambulatory Visit (INDEPENDENT_AMBULATORY_CARE_PROVIDER_SITE_OTHER): Payer: Self-pay | Admitting: Certified Nurse Midwife

## 2021-11-25 ENCOUNTER — Other Ambulatory Visit (HOSPITAL_COMMUNITY)
Admission: RE | Admit: 2021-11-25 | Discharge: 2021-11-25 | Disposition: A | Discharge status: Home / Self Care | Payer: Self-pay | Source: Ambulatory Visit | Attending: Certified Nurse Midwife | Admitting: Certified Nurse Midwife

## 2021-11-25 DIAGNOSIS — N760 Acute vaginitis: Secondary | ICD-10-CM

## 2021-11-25 DIAGNOSIS — N949 Unspecified condition associated with female genital organs and menstrual cycle: Secondary | ICD-10-CM

## 2021-11-25 DIAGNOSIS — B9689 Other specified bacterial agents as the cause of diseases classified elsewhere: Secondary | ICD-10-CM

## 2021-11-25 NOTE — Progress Notes (Signed)
Post Partum Visit Note  Colleen Daniels is a 31 y.o. [phone] female who presents for a postpartum visit. She is 5 weeks postpartum following a normal spontaneous vaginal delivery.  I have fully reviewed the prenatal and intrapartum course. The delivery was at [redacted]w[redacted]d gestational weeks.  Anesthesia:  nitrous and Fentanyl . Postpartum course complicated by a postpartum UTI, then mastitis. Has been having some itchy discharge since being on 17 days of antibiotics. Baby is doing well. Baby is feeding by breast. Bleeding staining only. Bowel function is normal. Bladder function is normal. Patient is sexually active. Contraception method is condoms and vasectomy. Postpartum depression screening: negative.   Edinburgh Postnatal Depression Scale - 11/25/21 1556       Edinburgh Postnatal Depression Scale:  In the Past 7 Days   I have been able to laugh and see the funny side of things. 0    I have looked forward with enjoyment to things. 1    I have blamed myself unnecessarily when things went wrong. 0    I have been anxious or worried for no good reason. 2    I have felt scared or panicky for no good reason. 0    Things have been getting on top of me. 2    I have been so unhappy that I have had difficulty sleeping. 0    I have felt sad or miserable. 1    I have been so unhappy that I have been crying. 0    The thought of harming myself has occurred to me. 0    Edinburgh Postnatal Depression Scale Total 6            There are no preventive care reminders to display for this patient.  The following portions of the patient's history were reviewed and updated as appropriate: allergies, current medications, past family history, past medical history, past social history, past surgical history, and problem list.  Review of Systems Pertinent items noted in HPI and remainder of comprehensive ROS otherwise negative.  Objective:  BP 99/64   Pulse 61   Wt 197 lb (89.4 kg)   Breastfeeding  Yes   BMI 34.90 kg/m    Constitutional: Alert, oriented female in no physical distress.  HEENT: PERRLA Skin: normal color and turgor, no rash Cardiovascular: normal rate & rhythm Respiratory: normal effort, no problems with respiration noted GI: Abd soft, non-tender MS: Extremities nontender, no edema, normal ROM Neurologic: Alert and oriented x 4.  GU: no CVA tenderness Pelvic: exam deferred, pt self-swabbed Breasts: exam deferred, pt not breastfeeding, no problems noted  Assessment:  1. Postpartum care and examination - Doing well overall, fatigued from new baby and other children  2. Vaginal discomfort - self-swab obtained, will treat as indicated  Plan:   Essential components of care per ACOG recommendations:  1.  Mood and well being: Patient with negative depression screening today. Reviewed local resources for support.  - Patient tobacco use? No.   - hx of drug use? No.    2. Infant care and feeding:  -Patient currently breastmilk feeding? No.  -Social determinants of health (SDOH) reviewed in EPIC. No concerns  3. Sexuality, contraception and birth spacing - Patient does not want a pregnancy in the next year.  Desired family size is 4 children.  - Reviewed reproductive life planning. Reviewed contraceptive methods based on pt preferences and effectiveness.  Patient desired Female Condom and Vasectomy today.   - Discussed birth spacing of 80  months  4. Sleep and fatigue -Encouraged family/partner/community support of 4 hrs of uninterrupted sleep to help with mood and fatigue  5. Physical Recovery  - Discussed patients delivery and complications. She describes her labor as good. - Patient had a Vaginal, no problems at delivery. Patient had no laceration. Perineal healing reviewed. Patient expressed understanding - Patient has urinary incontinence? No. - Patient is safe to resume physical and sexual activity  6.  Health Maintenance - HM due items addressed Yes -  Last pap smear  Diagnosis  Date Value Ref Range Status  04/27/2019   Final   - Negative for intraepithelial lesion or malignancy (NILM)   Pap smear not done at today's visit.  -Breast Cancer screening indicated? No.   7. Chronic Disease/Pregnancy Condition follow up: None - PCP follow up as needed  Bernerd Limbo, CNM [facility] [employer], [facility]

## 2021-11-26 LAB — CERVICOVAGINAL ANCILLARY ONLY
Bacterial Vaginitis (gardnerella): POSITIVE — AB
Candida Glabrata: NEGATIVE
Candida Vaginitis: NEGATIVE
Chlamydia: NEGATIVE
Comment: NEGATIVE
Comment: NEGATIVE
Comment: NEGATIVE
Comment: NEGATIVE
Comment: NEGATIVE
Comment: NORMAL
Neisseria Gonorrhea: NEGATIVE
Trichomonas: NEGATIVE

## 2021-11-26 MED ORDER — METRONIDAZOLE 500 MG PO TABS: 500.0000 mg | ORAL_TABLET | Freq: Two times a day (BID) | ORAL | 0 refills | Status: AC

## 2021-11-26 MED ORDER — FLUCONAZOLE 150 MG PO TABS: 150.0000 mg | ORAL_TABLET | Freq: Every day | ORAL | 1 refills | Status: AC

## 2021-11-26 NOTE — Addendum Note (Signed)
Addended by: Edd Arbour on: 11/26/2021 10:02 PM   Modules accepted: Orders

## 2024-02-21 ENCOUNTER — Other Ambulatory Visit (HOSPITAL_COMMUNITY): Payer: Self-pay

## 2024-02-22 ENCOUNTER — Other Ambulatory Visit: Payer: Self-pay

## 2024-02-22 DIAGNOSIS — G44209 Tension-type headache, unspecified, not intractable: Secondary | ICD-10-CM

## 2024-03-08 ENCOUNTER — Emergency Department (HOSPITAL_BASED_OUTPATIENT_CLINIC_OR_DEPARTMENT_OTHER): Payer: Self-pay

## 2024-03-08 ENCOUNTER — Other Ambulatory Visit: Payer: Self-pay

## 2024-03-08 ENCOUNTER — Emergency Department (HOSPITAL_BASED_OUTPATIENT_CLINIC_OR_DEPARTMENT_OTHER)
Admission: EM | Admit: 2024-03-08 | Discharge: 2024-03-08 | Disposition: A | Discharge status: Home / Self Care | Payer: Self-pay | Attending: Emergency Medicine | Admitting: Emergency Medicine

## 2024-03-08 DIAGNOSIS — I10 Essential (primary) hypertension: Secondary | ICD-10-CM | POA: Insufficient documentation

## 2024-03-08 DIAGNOSIS — Z79899 Other long term (current) drug therapy: Secondary | ICD-10-CM | POA: Insufficient documentation

## 2024-03-08 DIAGNOSIS — H53149 Visual discomfort, unspecified: Secondary | ICD-10-CM | POA: Insufficient documentation

## 2024-03-08 DIAGNOSIS — R519 Headache, unspecified: Secondary | ICD-10-CM | POA: Insufficient documentation

## 2024-03-08 LAB — PREGNANCY, URINE: Preg Test, Ur: NEGATIVE

## 2024-03-08 MED ORDER — PROCHLORPERAZINE EDISYLATE 10 MG/2ML IJ SOLN
10.0000 mg | Freq: Once | INTRAMUSCULAR | Status: AC
  Administered 2024-03-08: 10 mg via INTRAVENOUS
  Filled 2024-03-08: qty 2

## 2024-03-08 MED ORDER — KETOROLAC TROMETHAMINE 15 MG/ML IJ SOLN
15.0000 mg | Freq: Once | INTRAMUSCULAR | Status: DC
  Filled 2024-03-08: qty 1

## 2024-03-08 MED ORDER — DIPHENHYDRAMINE HCL 50 MG/ML IJ SOLN
12.5000 mg | Freq: Once | INTRAMUSCULAR | Status: AC
  Administered 2024-03-08: 12.5 mg via INTRAVENOUS
  Filled 2024-03-08: qty 1

## 2024-03-08 MED ORDER — ACETAZOLAMIDE 250 MG PO TABS: 250.0000 mg | ORAL_TABLET | Freq: Two times a day (BID) | ORAL | 1 refills | Status: DC

## 2024-03-08 MED ORDER — SODIUM CHLORIDE 0.9 % IV BOLUS
1000.0000 mL | Freq: Once | INTRAVENOUS | Status: AC
Start: 2024-03-08 — End: 2024-03-08
  Administered 2024-03-08: 1000 mL via INTRAVENOUS

## 2024-03-08 MED ORDER — KETOROLAC TROMETHAMINE 15 MG/ML IJ SOLN
15.0000 mg | Freq: Once | INTRAMUSCULAR | Status: AC
  Administered 2024-03-08: 15 mg via INTRAVENOUS
  Filled 2024-03-08: qty 1

## 2024-03-08 NOTE — ED Notes (Signed)
 Patient transported to CT

## 2024-03-08 NOTE — Discharge Instructions (Signed)
 Please get your MRI done on Saturday as scheduled.  You can take the medication I provided daily until you follow-up with neurology.  Also as we discussed, would like for you to present to either [facility] or [facility] if you start experiencing worsening headache with visual disturbances.  This could mean that the fluid in your brain is putting pressure on the back part of your eye.  Please continue taking Tylenol  or ibuprofen  as needed.

## 2024-03-08 NOTE — ED Provider Notes (Signed)
 [place] EMERGENCY DEPARTMENT AT [facility] Provider Note   CSN: [id number] Arrival date & time: 03/08/24  1047     Patient presents with: Headache   Colleen Daniels is a 33 y.o. female patient with history of migraines who presents to the emergency department today for further evaluation of headaches.  She states that she typically gets headaches over the left side of the face and down the left arm.  She states this is typical for her.  She states this is similar today but is now also encompassing her right side of her head and she feels like generally weak.  Saw her primary care doctor for this earlier this month.  Scheduled to follow-up with neurology in November.  Supposed to have an MRI scheduled for Saturday.  She states that her headaches are becoming more frequent and more intense.  She has been taking over-the-counter Tylenol  with no relief.  Does endorse some mild photophobia.  Denies nausea, vomiting, diarrhea. Does endorse some dizziness as well characterized as a spinning room sensation. No other visual disturbances.    Headache      Prior to Admission medications   Medication Sig Start Date End Date Taking? Authorizing Provider  acetaZOLAMIDE  (DIAMOX ) 250 MG tablet Take 1 tablet (250 mg total) by mouth 2 (two) times daily. 03/08/24  Yes Theotis, Farheen Pfahler M, PA-C  acetaminophen  (TYLENOL ) 500 MG tablet Take 2 tablets (1,000 mg total) by mouth every 8 (eight) hours as needed (pain). 10/20/21   Clem Tawni HERO, MD  fluconazole  (DIFLUCAN ) 150 MG tablet Take 1 tablet (150 mg total) by mouth daily. Take after completing BV treatment in case of yeast. 11/26/21   Vannie Cornell SAUNDERS, CNM  ibuprofen  (ADVIL ) 600 MG tablet Take 1 tablet (600 mg total) by mouth every 6 (six) hours as needed (pain). 10/20/21   Clem Tawni HERO, MD  metroNIDAZOLE  (FLAGYL ) 500 MG tablet Take 1 tablet (500 mg total) by mouth 2 (two) times daily. 11/26/21   Vannie Cornell SAUNDERS, CNM  Prenatal Vit-Fe  Fumarate-FA (PREPLUS) 27-1 MG TABS Take 1 tablet by mouth daily. 03/06/21   Gene Lucie BROCKS, CNM  pantoprazole  (PROTONIX ) 40 MG tablet Take 1 tablet (40 mg total) by mouth daily. Patient not taking: No sig reported 01/27/16 11/30/18  Mikell, Asiyah Zahra, MD    Allergies: Patient has no known allergies.    Review of Systems  Neurological:  Positive for headaches.  All other systems reviewed and are negative.   Updated Vital Signs BP 108/68   Pulse (!) 57   Temp 97.6 F (36.4 C) (Temporal)   Resp 20   LMP 03/01/2024 (Exact Date)   SpO2 100%   Breastfeeding No   Physical Exam Vitals and nursing note reviewed.  Constitutional:      General: She is not in acute distress.    Appearance: Normal appearance.  HENT:     Head: Normocephalic and atraumatic.     Right Ear: Tympanic membrane, ear canal and external ear normal.     Left Ear: Tympanic membrane, ear canal and external ear normal.  Eyes:     General:        Right eye: No discharge.        Left eye: No discharge.  Cardiovascular:     Comments: Regular rate and rhythm.  S1/S2 are distinct without any evidence of murmur, rubs, or gallops.  Radial pulses are 2+ bilaterally.  Dorsalis pedis pulses are 2+ bilaterally.  No evidence of pedal  edema. Pulmonary:     Comments: Clear to auscultation bilaterally.  Normal effort.  No respiratory distress.  No evidence of wheezes, rales, or rhonchi heard throughout. Abdominal:     General: Abdomen is flat. Bowel sounds are normal. There is no distension.     Tenderness: There is no abdominal tenderness. There is no guarding or rebound.  Musculoskeletal:        General: Normal range of motion.     Cervical back: Neck supple.  Skin:    General: Skin is warm and dry.     Findings: No rash.  Neurological:     General: No focal deficit present.     Mental Status: She is alert.     Comments: Cranial nerves II to XII are intact.  5/5 strength to the upper and lower extremities.  Normal  sensation to the upper lower extremities.  No dysmetria with finger-to-nose.  Psychiatric:        Mood and Affect: Mood normal.        Behavior: Behavior normal.     (all labs ordered are listed, but only abnormal results are displayed) Labs Reviewed  PREGNANCY, URINE    EKG: None  Radiology: CT Head Wo Contrast Result Date: 03/08/2024 CLINICAL DATA:  33 year old female with intermittent unilateral headache, progressive frequency and symptoms since last month. Bilateral headache today, pain radiating down the left arm, and difficulty focusing. EXAM: CT HEAD WITHOUT CONTRAST TECHNIQUE: Contiguous axial images were obtained from the base of the skull through the vertex without intravenous contrast. RADIATION DOSE REDUCTION: This exam was performed according to the departmental dose-optimization program which includes automated exposure control, adjustment of the mA and/or kV according to patient size and/or use of iterative reconstruction technique. COMPARISON:  None Available. FINDINGS: Brain: Partially empty sella. Normal background brain volume. No midline shift, ventriculomegaly, mass effect, evidence of mass lesion, intracranial hemorrhage or evidence of cortically based acute infarction. Gray-white matter differentiation is within normal limits throughout the brain. Vascular: No suspicious intracranial vascular hyperdensity. Skull: Intact, negative. Sinuses/Orbits: Visualized paranasal sinuses and mastoids are clear. Other: Visualized orbits and scalp soft tissues are within normal limits. IMPRESSION: Normal noncontrast Head CT aside from Partially empty sella, often a normal anatomic variant but can be associated with idiopathic intracranial hypertension (pseudotumor cerebri). Electronically Signed   By: VEAR Hurst M.D.   On: 03/08/2024 13:08     Procedures   Medications Ordered in the ED  ketorolac  (TORADOL ) 15 MG/ML injection 15 mg (has no administration in time range)  sodium chloride   0.9 % bolus 1,000 mL (1,000 mLs Intravenous New Bag/Given 03/08/24 1130)  prochlorperazine  (COMPAZINE ) injection 10 mg (10 mg Intravenous Given 03/08/24 1144)  diphenhydrAMINE  (BENADRYL ) injection 12.5 mg (12.5 mg Intravenous Given 03/08/24 1144)    Clinical Course as of 03/08/24 1428  Thu Mar 08, 2024  1132 Here with typical headache, more intense and frequent. Personally evaluated [LS]  1423 CT Head Wo Contrast Negative CT besides empty sella turcica.  Given the patient's age, weight, and symptoms concern for possible idiopathic intracranial hypertension. [CF]    Clinical Course User Index [CF] Theotis Cameron HERO, PA-C [LS] Rogelia Jerilynn RAMAN, MD    Medical Decision Making Jamelle Riley Daniels is a 33 y.o. female patient who presents to the emergency department today for further evaluation of headache.  This is pretty typical for her migraine history however, it is becoming more frequent and more severe.  Patient has a MRI scheduled for Saturday.  Will  get a CT scan here today to look for any intracranial emergencies.  Will also give her migraine cocktail and plan to reassess.  Vital signs are overall normal.  Patient is not having any visual disturbances at this time.  Low suspicion for acute angle-closure glaucoma, stroke.  CT scan does reveal empty sella turcica.  Clinical presentation does fit for idiopathic intracranial hypertension.  Again, patient has a MRI scheduled for Saturday which she will keep.  She is scheduled to follow-up with neurology in November.  I will prescribe her acetazolamide  for the next 60 days until she follows up with neurology.  I went over the etiologies and pathology of idiopathic intracranial hypertension, red flag symptoms to look out for.  Patient expressed full understanding.  Strict turn precautions were discussed.  She is safe for discharge at this time.   Amount and/or Complexity of Data Reviewed Labs: ordered. Radiology: ordered. Decision-making  details documented in ED Course.  Risk Prescription drug management.     Final diagnoses:  Acute nonintractable headache, unspecified headache type    ED Discharge Orders          Ordered    acetaZOLAMIDE  (DIAMOX ) 250 MG tablet  2 times daily        03/08/24 [address] [place], [place] 03/08/24 1428    Rogelia Jerilynn RAMAN, MD 03/09/24 1430

## 2024-03-08 NOTE — ED Triage Notes (Signed)
 Pt c/o unilateral headaches off and on with progressive worsening and frequency since August. Pt reports today she has bilateral headache with radiation down left arm and difficulty focusing.  Pt states she feels like her head is underwater with pressure.  Denies n/v or fever.  Pt took 2 tylenol  this am around 9am.

## 2024-03-10 ENCOUNTER — Ambulatory Visit
Admission: RE | Admit: 2024-03-10 | Discharge: 2024-03-10 | Disposition: A | Discharge status: Home / Self Care | Payer: Self-pay | Source: Ambulatory Visit

## 2024-03-10 DIAGNOSIS — G44209 Tension-type headache, unspecified, not intractable: Secondary | ICD-10-CM

## 2024-03-16 ENCOUNTER — Encounter (HOSPITAL_COMMUNITY): Payer: Self-pay | Admitting: *Deleted

## 2024-03-16 ENCOUNTER — Emergency Department (HOSPITAL_COMMUNITY)
Admission: EM | Admit: 2024-03-16 | Discharge: 2024-03-17 | Discharge status: Against Medical Advice | Payer: Self-pay | Attending: Emergency Medicine | Admitting: Emergency Medicine

## 2024-03-16 ENCOUNTER — Other Ambulatory Visit: Payer: Self-pay

## 2024-03-16 DIAGNOSIS — R519 Headache, unspecified: Secondary | ICD-10-CM | POA: Insufficient documentation

## 2024-03-16 DIAGNOSIS — Z5321 Procedure and treatment not carried out due to patient leaving prior to being seen by health care provider: Secondary | ICD-10-CM | POA: Insufficient documentation

## 2024-03-16 LAB — COMPREHENSIVE METABOLIC PANEL WITH GFR
ALT: 13 U/L (ref 0–44)
AST: 18 U/L (ref 15–41)
Albumin: 3.5 g/dL (ref 3.5–5.0)
Alkaline Phosphatase: 56 U/L (ref 38–126)
Anion gap: 8 (ref 5–15)
BUN: 9 mg/dL (ref 6–20)
CO2: 21 mmol/L — ABNORMAL LOW (ref 22–32)
Calcium: 8.5 mg/dL — ABNORMAL LOW (ref 8.9–10.3)
Chloride: 109 mmol/L (ref 98–111)
Creatinine, Ser: 0.69 mg/dL (ref 0.44–1.00)
GFR, Estimated: >60 mL/min (ref >60)
Glucose, Bld: 99 mg/dL (ref 70–99)
Potassium: 3.2 mmol/L — ABNORMAL LOW (ref 3.5–5.1)
Sodium: 138 mmol/L (ref 135–145)
Total Bilirubin: 0.7 mg/dL (ref 0.0–1.2)
Total Protein: 6.7 g/dL (ref 6.5–8.1)

## 2024-03-16 LAB — PREGNANCY, URINE: Preg Test, Ur: NEGATIVE

## 2024-03-16 LAB — URINALYSIS, ROUTINE W REFLEX MICROSCOPIC
Bilirubin Urine: NEGATIVE
Glucose, UA: NEGATIVE mg/dL
Hgb urine dipstick: NEGATIVE
Ketones, ur: NEGATIVE mg/dL
Leukocytes,Ua: NEGATIVE
Nitrite: NEGATIVE
Protein, ur: NEGATIVE mg/dL
Specific Gravity, Urine: 1.019 (ref 1.005–1.030)
pH: 5 (ref 5.0–8.0)

## 2024-03-16 LAB — CBC WITH DIFFERENTIAL/PLATELET
Abs Immature Granulocytes: 0.02 K/uL (ref 0.00–0.07)
Basophils Absolute: 0.1 K/uL (ref 0.0–0.1)
Basophils Relative: 1 %
Eosinophils Absolute: 0.1 K/uL (ref 0.0–0.5)
Eosinophils Relative: 1 %
HCT: 33.9 % — ABNORMAL LOW (ref 36.0–46.0)
Hemoglobin: 10.9 g/dL — ABNORMAL LOW (ref 12.0–15.0)
Immature Granulocytes: 0 %
Lymphocytes Relative: 18 %
Lymphs Abs: 2 K/uL (ref 0.7–4.0)
MCH: 26.7 pg (ref 26.0–34.0)
MCHC: 32.2 g/dL (ref 30.0–36.0)
MCV: 83.1 fL (ref 80.0–100.0)
Monocytes Absolute: 0.7 K/uL (ref 0.1–1.0)
Monocytes Relative: 7 %
Neutro Abs: 7.7 K/uL (ref 1.7–7.7)
Neutrophils Relative %: 73 %
Platelets: 341 K/uL (ref 150–400)
RBC: 4.08 MIL/uL (ref 3.87–5.11)
RDW: 13.8 % (ref 11.5–15.5)
WBC: 10.6 K/uL — ABNORMAL HIGH (ref 4.0–10.5)
nRBC: 0 % (ref 0.0–0.2)

## 2024-03-16 LAB — MAGNESIUM: Magnesium: 1.7 mg/dL (ref 1.7–2.4)

## 2024-03-16 MED ORDER — ACETAMINOPHEN 325 MG PO TABS
650.0000 mg | ORAL_TABLET | Freq: Once | ORAL | Status: AC
  Administered 2024-03-16: 650 mg via ORAL

## 2024-03-16 MED ORDER — ACETAMINOPHEN 325 MG PO TABS
ORAL_TABLET | ORAL | Status: AC
  Filled 2024-03-16: qty 2

## 2024-03-16 NOTE — ED Provider Triage Note (Signed)
 Emergency Medicine Provider Triage Evaluation Note  Colleen Daniels , a 33 y.o. female  was evaluated in triage.  Pt complains of headache. Recently seen at [facility], has had CT and MRI brain, diagnosed with intracranial hypertension, prescribed diamox , scheduled to see Neuro 05/08/24. Diamox  caused pt to feel poorly, stopped taking. Has right occipital headache that radiates to right trapezius area. No unilateral weakness/numbness. Reports vision stable, but needs glasses,   Review of Systems  Positive:  Negative:   Physical Exam  BP 114/74   Pulse 73   Temp 98.6 F (37 C)   Resp 18   LMP 03/01/2024 (Exact Date)   SpO2 98%  Gen:   Awake, no distress   Resp:  Normal effort  MSK:   Moves extremities without difficulty  Other:  Speech clear  Medical Decision Making  Medically screening exam initiated at 6:56 PM.  Appropriate orders placed.  Colleen Daniels was informed that the remainder of the evaluation will be completed by another provider, this initial triage assessment does not replace that evaluation, and the importance of remaining in the ED until their evaluation is complete.     Beverley Leita LABOR, PA-C 03/16/24 [id number]

## 2024-03-16 NOTE — ED Triage Notes (Signed)
 Headache for 2 months she has been seen at [facility] and at her doctor shehas had a c-t scan and a mri  diamox  that she was having difficulty taking  lmp 09 11

## 2024-03-17 ENCOUNTER — Encounter (HOSPITAL_COMMUNITY): Payer: Self-pay

## 2024-03-17 ENCOUNTER — Emergency Department (HOSPITAL_COMMUNITY)
Admission: EM | Admit: 2024-03-17 | Discharge: 2024-03-17 | Disposition: A | Discharge status: Home / Self Care | Payer: Self-pay | Source: Ambulatory Visit | Attending: Emergency Medicine | Admitting: Emergency Medicine

## 2024-03-17 ENCOUNTER — Other Ambulatory Visit: Payer: Self-pay

## 2024-03-17 DIAGNOSIS — G932 Benign intracranial hypertension: Secondary | ICD-10-CM | POA: Insufficient documentation

## 2024-03-17 MED ORDER — TOPIRAMATE 25 MG PO TABS: 25.0000 mg | ORAL_TABLET | Freq: Two times a day (BID) | ORAL | 0 refills | Status: AC

## 2024-03-17 MED ORDER — PROCHLORPERAZINE EDISYLATE 10 MG/2ML IJ SOLN: 10.0000 mg | Freq: Once | INTRAMUSCULAR | Status: DC

## 2024-03-17 MED ORDER — PROCHLORPERAZINE EDISYLATE 10 MG/2ML IJ SOLN
10.0000 mg | Freq: Once | INTRAMUSCULAR | Status: AC
  Administered 2024-03-17: 10 mg via INTRAMUSCULAR
  Filled 2024-03-17: qty 2

## 2024-03-17 MED ORDER — DIPHENHYDRAMINE HCL 50 MG/ML IJ SOLN
25.0000 mg | Freq: Once | INTRAMUSCULAR | Status: AC
  Administered 2024-03-17: 25 mg via INTRAMUSCULAR
  Filled 2024-03-17: qty 1

## 2024-03-17 MED ORDER — TOPIRAMATE 25 MG PO TABS
25.0000 mg | ORAL_TABLET | Freq: Two times a day (BID) | ORAL | Status: DC
  Administered 2024-03-17: 25 mg via ORAL
  Filled 2024-03-17: qty 1

## 2024-03-17 MED ORDER — DIPHENHYDRAMINE HCL 50 MG/ML IJ SOLN: 25.0000 mg | Freq: Once | INTRAMUSCULAR | Status: DC

## 2024-03-17 NOTE — Discharge Instructions (Addendum)
 It was a pleasure taking care of you today. You were seen in the Emergency Department for idiopathic intracranial hypertension. I am prescribing you with a medication called Topiramate (Topamax). Your first dose was given in the ER.   You will take 25mg  of the medication twice per day for one week Then you will take 50mg  twice per day everyday thereafter until you see neurology  This medication may make you feel tired or fuzzy for the first week, but that should improve.   Please also call opthalmology to schedule an appointment within 1 week for visual field testing. A referral is provided above.  Call [facility] again to try to schedule an earlier appointment.   The rest of your work-up was reassuring.   Please return to the ER if you experience chest pain, trouble breathing, intractable nausea/vomiting or any other life threatening illnesses.

## 2024-03-17 NOTE — ED Triage Notes (Signed)
 Patient has been having headaches for over 1 month. Was told she has idiopathic hypertension. Stated she takes a medication to help with this, but every time she takes this medication it makes her dizzy and has full body shakes.

## 2024-03-17 NOTE — ED Provider Notes (Signed)
 [place] EMERGENCY DEPARTMENT AT [facility] Provider Note   CSN: [id number] Arrival date & time: 03/17/24  1237     Patient presents with: Headache   Colleen Daniels is a 33 y.o. 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 She started this medication after being evaluated at [facility] on 9/18 but reports her headaches were not relieved and she had intolerable side effects where she was shaking uncontrollably and couldn't stand up straight. Today, patient reports head fullness and occasional dizziness associated with the headache. Last dose of Diamox  was Tuesday.   Headache      Prior to Admission medications   Medication Sig Start Date End Date Taking? Authorizing Provider  topiramate (TOPAMAX) 25 MG tablet Take 1 tablet (25 mg total) by mouth 2 (two) times daily. 03/17/24  Yes Jerni Selmer, Marry RAMAN, PA-C  acetaminophen  (TYLENOL ) 500 MG tablet Take 2 tablets (1,000 mg total) by mouth every 8 (eight) hours as needed (pain). 10/20/21   Clem Tawni HERO, MD  fluconazole  (DIFLUCAN ) 150 MG tablet Take 1 tablet (150 mg total) by mouth daily. Take after completing BV treatment in case of yeast. 11/26/21   Vannie Cornell SAUNDERS, CNM  ibuprofen  (ADVIL ) 600 MG tablet Take 1 tablet (600 mg total) by mouth every 6 (six) hours as needed (pain). 10/20/21   Clem Tawni HERO, MD  metroNIDAZOLE  (FLAGYL ) 500 MG tablet Take 1 tablet (500 mg total) by mouth 2 (two) times daily. 11/26/21   Vannie Cornell SAUNDERS, CNM  Prenatal Vit-Fe Fumarate-FA (PREPLUS) 27-1 MG TABS Take 1 tablet by mouth daily. 03/06/21   Gene Lucie BROCKS, CNM  pantoprazole  (PROTONIX ) 40 MG tablet Take 1 tablet (40 mg total) by mouth daily. Patient not taking: No sig reported 01/27/16 11/30/18  Mikell, Asiyah Zahra, MD    Allergies: Patient has no known allergies.    Review of Systems  Neurological:  Positive for  headaches.    Updated Vital Signs BP 126/76   Pulse 82   Temp 97.9 F (36.6 C) (Oral)   Resp 19   Ht 5' 3 (1.6 m)   Wt 90.7 kg   LMP 03/01/2024 (Exact Date)   SpO2 100%   BMI 35.43 kg/m   Physical Exam Vitals and nursing note reviewed.  Constitutional:      Appearance: Normal appearance.  HENT:     Head: Normocephalic and atraumatic.     Mouth/Throat:     Mouth: Mucous membranes are moist.  Eyes:     General: No scleral icterus.       Right eye: No discharge.        Left eye: No discharge.     Extraocular Movements: Extraocular movements intact.     Right eye: No nystagmus.     Left eye: No nystagmus.     Conjunctiva/sclera: Conjunctivae normal.     Comments: EOMs intact but painful to the right. No evidence of tearing  Cardiovascular:     Rate and Rhythm: Normal rate and regular rhythm.     Pulses: Normal pulses.  Pulmonary:     Effort: Pulmonary effort is normal.     Breath sounds: Normal breath sounds.  Abdominal:     General: There is no distension.     Tenderness: There is no abdominal tenderness.  Musculoskeletal:        General: No deformity.  Cervical back: Normal range of motion.  Skin:    General: Skin is warm and dry.     Capillary Refill: Capillary refill takes less than 2 seconds.  Neurological:     Mental Status: She is alert.     GCS: GCS eye subscore is 4. GCS verbal subscore is 5. GCS motor subscore is 6.     Cranial Nerves: No cranial nerve deficit.     Motor: No weakness.  Psychiatric:        Mood and Affect: Mood normal.        Behavior: Behavior is not agitated.     (all labs ordered are listed, but only abnormal results are displayed) Labs Reviewed - No data to display  EKG: None  Radiology: No results found.   Medications Ordered in the ED  prochlorperazine  (COMPAZINE ) injection 10 mg (has no administration in time range)  diphenhydrAMINE  (BENADRYL ) injection 25 mg (has no administration in time range)  topiramate  (TOPAMAX) tablet 25 mg (has no administration in time range)                                     Medical Decision Making  This patient presents to the ED for concern of headaches, this involves an extensive number of treatment options, and is a complaint that carries with it a high risk of complications and morbidity.  Differential diagnosis includes: migraine, IIH, tension headache, SAH, cluster headache.  Co morbidities: Obesity   Additional history:  Patient evaluated by Dr. Rogelia in the ED on 9/18 for evaluation of acute intractable headache which is likely consistent with IIH. She was prescribed Diamox  at this time and told to follow up with her neurologist.  Imaging Studies:  Prior CT on 9/18 and MRI on 9/20 provided confirmation of IIH diagnosis.   Medicines ordered and prescription drug management:  I ordered medication including  Medications  prochlorperazine  (COMPAZINE ) injection 10 mg (has no administration in time range)  diphenhydrAMINE  (BENADRYL ) injection 25 mg (has no administration in time range)  topiramate (TOPAMAX) tablet 25 mg (has no administration in time range)   for pain control Reevaluation of the patient after these medicines showed that the patient improved I have reviewed the patients home medicines and have made adjustments as needed  Test Considered:  None  Critical Interventions:  None  Consultations Obtained: - Neurology   Problem List / ED Course:     ICD-10-CM   1. Idiopathic intracranial hypertension  G93.2       MDM: Patient is a 33 year old female who reports to the emergency department for headaches. She was seen in the emergency department on 9/18 and given a diagnosis of idiopathic hypertension and told to follow up with a neurologist. She was prescribed Diamox   but stopped taking it on Tuesday d/t intolerable side effects. CT from 9/18 was consistent with IIH. MRI was completed on 9/20 which is also consistent with  IIH. Today she has a widespread pressure headache. No nystagmus. No acute visual changes. Consulted neurology for further management. Neuro provided guidance to start patient on 25mg  Topiramate BID for 1 week and then titrate to 50mg  BID thereafter until she follows up with outpatient neurology. Neurology also provided recommendation for ophthalmology follow-up within 1 week.   Dispostion:  After consideration of the diagnostic results and the patients response to treatment, I feel that the patient would benefit from  outpatient neurology and ophthalmology follow-up as well as titration of Topiramate managed by neurology.   Final diagnoses:  Idiopathic intracranial hypertension    ED Discharge Orders          Ordered    topiramate (TOPAMAX) 25 MG tablet  2 times daily        03/17/24 1456               Jenisse Vullo S, PA-C 03/17/24 1501    Charlyn Sora, MD 03/18/24 1402

## 2024-03-17 NOTE — ED Notes (Signed)
Pt informed registration she was leaving  

## 2024-03-17 NOTE — ED Notes (Signed)
 Visual acuity 20/30 Right Eye at 20 ft 20/40 Left Eye at 20 Ft 20/30 Both eyes at 20 ft

## 2024-03-20 ENCOUNTER — Encounter: Payer: Self-pay | Admitting: Neurology

## 2024-03-20 ENCOUNTER — Ambulatory Visit (INDEPENDENT_AMBULATORY_CARE_PROVIDER_SITE_OTHER): Payer: Self-pay | Admitting: Neurology

## 2024-03-20 VITALS — BP 98/70 | HR 65 | Ht 63.0 in | Wt 203.8 lb

## 2024-03-20 DIAGNOSIS — H538 Other visual disturbances: Secondary | ICD-10-CM

## 2024-03-20 DIAGNOSIS — G479 Sleep disorder, unspecified: Secondary | ICD-10-CM

## 2024-03-20 DIAGNOSIS — R519 Headache, unspecified: Secondary | ICD-10-CM

## 2024-03-20 DIAGNOSIS — E236 Other disorders of pituitary gland: Secondary | ICD-10-CM

## 2024-03-20 DIAGNOSIS — R0683 Snoring: Secondary | ICD-10-CM

## 2024-03-20 DIAGNOSIS — Z9189 Other specified personal risk factors, not elsewhere classified: Secondary | ICD-10-CM

## 2024-03-20 DIAGNOSIS — G4489 Other headache syndrome: Secondary | ICD-10-CM

## 2024-03-20 DIAGNOSIS — E669 Obesity, unspecified: Secondary | ICD-10-CM

## 2024-03-20 NOTE — Patient Instructions (Addendum)
 It was nice to meet you today.   As discussed, your headaches are likely due to a combination of factors.   Here is what we discussed today and my recommendations for you:   You were found to have a so-called partially empty sella. This is typically an incidental finding but can be associated with a condition called idiopathic intracranial hypertension (IIH) aka a condition called pseudotumor cerebri.  This means that there may be an increased fluid pressure around the brain, resulting in pressure on your brain, which can cause headaches, and pressure on the eye nerve(s), causing blurry vision, visual distortion and even loss of vision in extreme cases.   I would like to suggest a few things based on the MRI and head CT findings:    I would like for you to get a formal eye exam.  I recommend a full, dilated, eye exam including visual field testing, to see if there is any loss of peripheral vision. We can make a referral to ophthalmology if needed, otherwise you can see your current optometrist for this.  Please ask them to send records of their office visit to us .  Depending on your eye examination, we may consider a LP (lumbar puncture/spinal tap) with pressure testing on your spinal fluid and routine fluid testing. Eventually, if the spinal fluid pressure is indeed elevated, we will reconsider starting you on a medication called diamox  to help keep the spinal fluid pressure at bay.  Some people need more than 1 spinal tap over time. Please continue with your topiramate 25 mg twice daily.  You can discuss with your primary care refills on this medication which may help your recurrent headaches.  This is also a common medication that we use for migraine management as a daily preventative.  Please also talk to your PCP about weight loss options.  Losing weight can improve the risk for intracranial hypertension. We will also do a sleep study to rule out obstructive sleep apnea.  If you have  obstructive sleep apnea, I will want you to start treatment with a machine called CPAP or AutoPAP.  Treatment of obstructive sleep apnea can help headaches, also help with metabolism and weight loss, and ultimately, treatment of sleep apnea can reduce risk for cardiovascular complications including heart disease and stroke risk. Try to hydrate well with water, aim for 64 ounces of water per day, limit your caffeine to 1 serving per day or less.  Avoid alcohol altogether. I will order another scan which is called a MR venogram which looks at the brain blood vessels particularly the veins and drainage system of your brain and can give us  clues regarding possibility of blockages and reasons for increased fluid pressure around the brain.  Thankfully, your neurological exam is unremarkable today and we will plan to see you routinely in about 3 months.

## 2024-03-20 NOTE — Progress Notes (Signed)
 Subjective:    Patient ID: Colleen Daniels is a 33 y.o. female.  HPI    True Mar, MD, PhD [facility] [address] [address] [place], [place] [address]  Dear Yolande,   I saw your patient, Colleen Daniels, upon your kind request in my neurologic clinic today for initial consultation of her recurrent headaches.  The patient is accompanied by her husband and youngest son today.  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  In the past month and a half she has had more constant headaches.  Sometimes they are one-sided, she has had some intermittent left eye blurry vision.  She saw her optometrist and had an eye examination but not a full, dilated eye exam.  She denies any loss of vision.  She has astigmatism and has prescription eyeglasses which she uses occasionally.  She tries to hydrate well.  Her Epworth sleepiness score is 12 out of 24, fatigue severity score is 34 out of 63.  According to her husband she snores mild to moderately.  She has never had a sleep study.  She tries to be in bed between 9:40 PM and 10:30 PM and rise time is generally between 5:30 AM and 5:40 AM.  She does not currently work.  She lives at home with her family including husband and 4 children.  She was started on Diamox  a few weeks ago but only took it for 2 days and had side effects including feeling unsteady and shaky.  She is tolerating topiramate which is currently 25 mg twice daily.  She denies any sudden onset one-sided weakness or numbness or tingling or droopy face or slurring of speech.  She has never had a lumbar puncture.  She has had rare morning headaches, no nightly nocturia.  She limits her caffeine to about 1 cup of  coffee per day.  She does not drink any soda.  She is a non-smoker.  She is working on weight loss.  Of note, she presented to the emergency room recently on 03/17/2024 and I reviewed the emergency room records.  She was on acetazolamide  but was switched to topiramate after her emergency room visit.  She was also treated symptomatically for headaches with diphenhydramine  and prochlorperazine .  She had a brain MRI without contrast on 03/10/2024 and I reviewed the results:  IMPRESSION: 1. No evidence of an acute intracranial abnormality. 2. Partially empty sella turcica. This finding can reflect incidental anatomic variation, or alternatively, it can be associated with chronic idiopathic intracranial hypertension (pseudotumor cerebri). 3. Otherwise unremarkable non-contrast MRI appearance of the brain.   In addition, I personally and independently reviewed images through the PACS system.  She had a head CT without contrast on 03/08/2024 and I reviewed the results:    IMPRESSION: Normal noncontrast Head CT aside from Partially empty sella, often a normal anatomic variant but can be associated with idiopathic intracranial hypertension (pseudotumor cerebri).   Her Past Medical History Is Significant For: Past Medical History:  Diagnosis Date   Allergy    Seasonal   COVID-19 10/2018   developed pneumonia   Dysmenorrhea    Headache    Hepatitis B    as a child   IBS (irritable bowel syndrome)    No pertinent past medical  history    Pelvic congestion syndrome    PID (acute pelvic inflammatory disease)     Her Past Surgical History Is Significant For: Past Surgical History:  Procedure Laterality Date   COLONOSCOPY  2016   done in [place] - confirmed IBS mixed     Her Family History Is Significant For: Family History  Problem Relation Age of Onset   Hypertension Mother    Diabetes Mother    Thyroid disease Mother    Stroke Maternal Grandmother    Diabetes Maternal  Grandmother    Hypertension Maternal Grandmother    Heart attack Maternal Grandmother    Osteoporosis Maternal Grandmother    Hyperlipidemia Paternal Grandmother     Her Social History Is Significant For: Social History   Socioeconomic History   Marital status: Married    Spouse name: Not on file   Number of children: 3   Years of education: Not on file   Highest education level: Not on file  Occupational History   Occupation: housewife  Tobacco Use   Smoking status: Never   Smokeless tobacco: Never   Tobacco comments:    Smoked as teenager  Vaping Use   Vaping status: Never Used  Substance and Sexual Activity   Alcohol use: No   Drug use: No   Sexual activity: Not Currently    Partners: Male    Birth control/protection: None    Comment: condoms occ  Other Topics Concern   Not on file  Social History Narrative   Caffiene 1 cup daily   Working 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     Social Drivers of Corporate investment banker Strain: Not on file  Food Insecurity: Not at Risk (12/28/2023)   Received from [facility] Insecurity    Within the past 12 months, you worried that your food would run out before you got money to buy more.: 1  Transportation Needs: Not at Risk (12/28/2023)   Received from [employer] Needs    In the past 12 months, has lack of transportation kept you from medical appointments, meetings, work or from getting things needed for daily living?: 1  Physical Activity: Not on file  Stress: Not on file  Social Connections: Not on file    Her Allergies Are:  No Known Allergies:   Her Current Medications Are:  Outpatient Encounter Medications as of 03/20/2024  Medication Sig   acetaminophen  (TYLENOL ) 500 MG tablet Take 2 tablets (1,000 mg total) by mouth every 8 (eight) hours as needed (pain).   aspirin-acetaminophen -caffeine (EXCEDRIN MIGRAINE) 250-250-65 MG tablet Take 2 tablets by mouth every 6 (six) hours as needed for headache.    ibuprofen  (ADVIL ) 600 MG tablet Take 1 tablet (600 mg total) by mouth every 6 (six) hours as needed (pain).   fluconazole  (DIFLUCAN ) 150 MG tablet Take 1 tablet (150 mg total) by mouth daily. Take after completing BV treatment in case of yeast. (Patient not taking: Reported on 03/20/2024)   metroNIDAZOLE  (FLAGYL ) 500 MG tablet Take 1 tablet (500 mg total) by mouth 2 (two) times daily. (Patient not taking: Reported on 03/20/2024)   Prenatal Vit-Fe Fumarate-FA (PREPLUS) 27-1 MG TABS Take 1 tablet by mouth daily. (Patient not taking: Reported on 03/20/2024)   topiramate (TOPAMAX) 25 MG tablet Take 1 tablet (25 mg total) by mouth 2 (two) times daily.   [DISCONTINUED] pantoprazole  (PROTONIX ) 40 MG tablet Take 1 tablet (40 mg total) by mouth daily. (Patient not  taking: No sig reported)   No facility-administered encounter medications on file as of 03/20/2024.  :   Review of Systems:  Out of a complete 14 point review of systems, all are reviewed and negative with the exception of these symptoms as listed below:  Review of Systems  Neurological:        Headaches,  Had CT / MRI diagnosed IIH. On topamax.  No vision changes (wears glasses).  [facility] (not dilated this last time 01/31/2024).      Objective:  Neurological Exam  Physical Exam Physical Examination:   Vitals:   03/20/24 1427  BP: 98/70  Pulse: 65    General Examination: The patient is a very pleasant 33 y.o. female in no acute distress. She appears well-developed and well-nourished and well groomed.   HEENT: Normocephalic, atraumatic, pupils are equal, round and reactive to light, no photophobia.  Funduscopic exam benign.  Visual field benign with finger perimetry.  Extraocular tracking is good without limitation to gaze excursion or nystagmus noted. Hearing is grossly intact. Face is symmetric with normal facial animation. Speech is clear with no dysarthria noted. There is no hypophonia. There is no lip, neck/head, jaw or  voice tremor. Neck is supple with full range of passive and active motion. There are no carotid bruits on auscultation. Oropharynx exam reveals: mild mouth dryness, good dental hygiene and mild to moderate airway crowding secondary to small airway entry.  Mallampati class II.  Tongue protrudes centrally and palate elevates symmetrically.  Small airway entry noted, tonsils about 1+ bilaterally.    Chest: Clear to auscultation without wheezing, rhonchi or crackles noted.  Heart: S1+S2+0, regular and normal without murmurs, rubs or gallops noted.   Abdomen: Soft, non-tender and non-distended.  Extremities: There is no pitting edema in the distal lower extremities bilaterally.   Skin: Warm and dry without trophic changes noted.   Musculoskeletal: exam reveals no obvious joint deformities.   Neurologically:  Mental status: The patient is awake, alert and oriented in all 4 spheres. Her immediate and remote memory, attention, language skills and fund of knowledge are appropriate. There is no evidence of aphasia, agnosia, apraxia or anomia. Speech is clear with normal prosody and enunciation. Thought process is linear. Mood is normal and affect is normal.  Cranial nerves II - XII are as described above under HEENT exam.  Motor exam: Normal bulk, strength and tone is noted. There is no obvious action or resting tremor.  No drift or rebound, no postural or intention tremor. Fine motor skills and coordination: grossly intact with finger taps, hand movements and rapid alternating patterning with both upper extremities, normal foot taps bilaterally in the lower extremities. Cerebellar testing: No dysmetria or intention tremor. There is no truncal or gait ataxia.  Sensory exam: intact to light touch in the upper and lower extremities.  Romberg negative. Reflexes 1+ throughout, toes are downgoing bilaterally. Gait, station and balance: She stands easily. No veering to one side is noted. No leaning to one  side is noted. Posture is age-appropriate and stance is narrow based. Gait shows normal stride length and normal pace. No problems turning are noted.  Normal tandem walk.  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  She was  found to have a partially empty sella on recent CT and MRI scans.  She has a nonfocal neurological exam.  Headache is possibly secondary to a mixed type of etiology including possible migrainous headaches, tension headaches, underlying sleep disordered breathing not excluded, strain on the eyes, and IIH not excluded.  She is advised that a partially empty sella does not necessarily mean she has increased intracranial pressure.  This was an extended visit of over 60 minutes with copious record review involved in considerable counseling and coordination of care.  She is vies to maintain her medication regimen for now and continue with low-dose topiramate and request refills from your office for now.   We will proceed with additional workup from our end.     Below is a summary of my recommendations and our discussion points from today's visit, based on chart review, history and examination. They were given these instructions verbally during the visit in detail and also in writing in the MyChart after visit summary (AVS), which they can access electronically. <<   I would like for you to get a formal eye exam.  I recommend a full, dilated, eye exam including visual field testing, to see if there is any loss of peripheral vision. We can make a referral to ophthalmology if needed, otherwise you can see your current optometrist for this.  Please ask them to send records of their office  visit to us .  Depending on your eye examination, we may consider a LP (lumbar puncture/spinal tap) with pressure testing on your spinal fluid and routine fluid testing. Eventually, if the spinal fluid pressure is indeed elevated, we will reconsider starting you on a medication called diamox  to help keep the spinal fluid pressure at bay.  Some people need more than 1 spinal tap over time. Please continue with your topiramate 25 mg twice daily.  You can discuss with your primary care refills on this medication which may help your recurrent headaches.  This is also a common medication that we use for migraine management as a daily preventative.  Please also talk to your PCP about weight loss options.  Losing weight can improve the risk for intracranial hypertension. We will also do a sleep study to rule out obstructive sleep apnea.  If you have obstructive sleep apnea, I will want you to start treatment with a machine called CPAP or AutoPAP.  Treatment of obstructive sleep apnea can help headaches, also help with metabolism and weight loss, and ultimately, treatment of sleep apnea can reduce risk for cardiovascular complications including heart disease and stroke risk. Try to hydrate well with water, aim for 64 ounces of water per day, limit your caffeine to 1 serving per day or less.  Avoid alcohol altogether. I will order another scan which is called a MR venogram which looks at the brain blood vessels particularly the veins and drainage system of your brain and can give us  clues regarding possibility of blockages and reasons for increased fluid pressure around the brain.  Thankfully, your neurological exam is unremarkable today and we will plan to see you routinely in about 3 months. >>    I answered all their questions today and the patient and her husband were in agreement with our approach.  Thank you very much for allowing me to participate in the care of this nice patient. If I can be of any further  assistance to you please do not hesitate to call me at [phone].  Sincerely,   Agostino Gorin  Buck, MD, PhD

## 2024-03-21 ENCOUNTER — Telehealth: Payer: Self-pay | Admitting: Neurology

## 2024-03-21 NOTE — Telephone Encounter (Signed)
 no auth required sent to GI [phone]

## 2024-03-23 ENCOUNTER — Telehealth: Payer: Self-pay | Admitting: Neurology

## 2024-03-23 NOTE — Telephone Encounter (Signed)
 Pt is f/u on the Referral for optometrist from Dr Buck

## 2024-03-26 NOTE — Telephone Encounter (Signed)
 I called pt she had made appt with (my eye doctor) on [facility].  She understands needs eyes dilated, looking at optic nerves for IIH.  She said yes was aware.  They are to get results to us  for review.  (No referral was needed).  Sleep lab is working on costs for PSG and HST for pt as she pays out of pocket.  She appreciated call back.

## 2024-04-02 ENCOUNTER — Telehealth: Payer: Self-pay | Admitting: Neurology

## 2024-04-02 NOTE — Telephone Encounter (Signed)
 self pay? sent mychart

## 2024-04-07 ENCOUNTER — Ambulatory Visit
Admission: RE | Admit: 2024-04-07 | Discharge: 2024-04-07 | Disposition: A | Discharge status: Home / Self Care | Payer: Self-pay | Source: Ambulatory Visit | Attending: Neurology | Admitting: Neurology

## 2024-04-07 DIAGNOSIS — Z9189 Other specified personal risk factors, not elsewhere classified: Secondary | ICD-10-CM

## 2024-04-07 DIAGNOSIS — R0683 Snoring: Secondary | ICD-10-CM

## 2024-04-07 DIAGNOSIS — E236 Other disorders of pituitary gland: Secondary | ICD-10-CM

## 2024-04-07 DIAGNOSIS — H538 Other visual disturbances: Secondary | ICD-10-CM

## 2024-04-07 DIAGNOSIS — R519 Headache, unspecified: Secondary | ICD-10-CM

## 2024-04-07 DIAGNOSIS — E669 Obesity, unspecified: Secondary | ICD-10-CM

## 2024-04-07 DIAGNOSIS — G4489 Other headache syndrome: Secondary | ICD-10-CM

## 2024-04-07 DIAGNOSIS — G479 Sleep disorder, unspecified: Secondary | ICD-10-CM

## 2024-04-09 ENCOUNTER — Ambulatory Visit: Payer: Self-pay | Admitting: Neurology

## 2024-04-27 ENCOUNTER — Telehealth: Payer: Self-pay | Admitting: Neurology

## 2024-04-27 NOTE — Telephone Encounter (Signed)
 I received optometry records.  Patient had a visit with Dr. Delon Camp on 04/18/2024 and I reviewed the note: Patient was noted to have optic nerves healthy and intraocular pressure was within normal limits.  Corrected visual acuity was 20/20 bilaterally.  Optic nerves appeared healthy and she was encouraged to follow-up as needed.  I will request for this record to be scanned into her chart.  FYI, nothing further needed.

## 2024-05-08 ENCOUNTER — Ambulatory Visit: Payer: Self-pay | Admitting: Diagnostic Neuroimaging

## 2024-07-05 ENCOUNTER — Ambulatory Visit: Payer: Self-pay | Admitting: Neurology
